# Patient Record
Sex: Male | Born: 1965
Health system: Southern US, Community
[De-identification: ages and names within clinical notes are randomized; demographics above are authoritative.]

## PROBLEM LIST (undated history)

## (undated) DIAGNOSIS — G4730 Sleep apnea, unspecified: Secondary | ICD-10-CM

## (undated) DIAGNOSIS — K219 Gastro-esophageal reflux disease without esophagitis: Secondary | ICD-10-CM

## (undated) DIAGNOSIS — E119 Type 2 diabetes mellitus without complications: Secondary | ICD-10-CM

## (undated) DIAGNOSIS — I10 Essential (primary) hypertension: Secondary | ICD-10-CM

## (undated) HISTORY — PX: TOE SURGERY: SHX1073

## (undated) HISTORY — DX: Gastro-esophageal reflux disease without esophagitis: K21.9

## (undated) HISTORY — PX: TOTAL HIP ARTHROPLASTY: SHX124

## (undated) HISTORY — DX: Essential (primary) hypertension: I10

---

## 2001-02-06 ENCOUNTER — Ambulatory Visit (HOSPITAL_BASED_OUTPATIENT_CLINIC_OR_DEPARTMENT_OTHER): Admission: RE | Admit: 2001-02-06 | Discharge: 2001-02-06 | Payer: Self-pay | Admitting: Internal Medicine

## 2002-02-21 ENCOUNTER — Emergency Department (HOSPITAL_COMMUNITY): Admission: EM | Admit: 2002-02-21 | Discharge: 2002-02-21 | Payer: Self-pay | Admitting: Emergency Medicine

## 2007-03-08 HISTORY — PX: JOINT REPLACEMENT: SHX530

## 2008-07-15 ENCOUNTER — Inpatient Hospital Stay (HOSPITAL_COMMUNITY): Admission: RE | Admit: 2008-07-15 | Discharge: 2008-07-18 | Payer: Self-pay | Admitting: Orthopaedic Surgery

## 2008-07-22 ENCOUNTER — Emergency Department (HOSPITAL_COMMUNITY): Admission: EM | Admit: 2008-07-22 | Discharge: 2008-07-23 | Payer: Self-pay | Admitting: Emergency Medicine

## 2010-06-15 LAB — BASIC METABOLIC PANEL
BUN: 7 mg/dL (ref 6–23)
BUN: 8 mg/dL (ref 6–23)
CO2: 28 mEq/L (ref 19–32)
CO2: 31 mEq/L (ref 19–32)
CO2: 31 mEq/L (ref 19–32)
Chloride: 97 mEq/L (ref 96–112)
Chloride: 98 mEq/L (ref 96–112)
Chloride: 98 mEq/L (ref 96–112)
Creatinine, Ser: 1.04 mg/dL (ref 0.4–1.5)
Creatinine, Ser: 1.09 mg/dL (ref 0.4–1.5)
GFR calc Af Amer: >60 mL/min (ref >60)
GFR calc Af Amer: >60 mL/min (ref >60)
Glucose, Bld: 114 mg/dL — ABNORMAL HIGH (ref 70–99)
Glucose, Bld: 117 mg/dL — ABNORMAL HIGH (ref 70–99)
Potassium: 3.5 mEq/L (ref 3.5–5.1)
Potassium: 4 mEq/L (ref 3.5–5.1)
Sodium: 137 mEq/L (ref 135–145)

## 2010-06-15 LAB — CBC
HCT: 33 % — ABNORMAL LOW (ref 39.0–52.0)
HCT: 32.6 % — ABNORMAL LOW (ref 39.0–52.0)
HCT: 34.1 % — ABNORMAL LOW (ref 39.0–52.0)
HCT: 38.4 % — ABNORMAL LOW (ref 39.0–52.0)
Hemoglobin: 10.8 g/dL — ABNORMAL LOW (ref 13.0–17.0)
Hemoglobin: 12.7 g/dL — ABNORMAL LOW (ref 13.0–17.0)
MCHC: 33 g/dL (ref 30.0–36.0)
MCHC: 33.1 g/dL (ref 30.0–36.0)
MCHC: 33.1 g/dL (ref 30.0–36.0)
MCHC: 33.3 g/dL (ref 30.0–36.0)
MCV: 77.4 fL — ABNORMAL LOW (ref 78.0–100.0)
MCV: 76.8 fL — ABNORMAL LOW (ref 78.0–100.0)
MCV: 77.6 fL — ABNORMAL LOW (ref 78.0–100.0)
MCV: 77.7 fL — ABNORMAL LOW (ref 78.0–100.0)
Platelets: 527 10*3/uL — ABNORMAL HIGH (ref 150–400)
Platelets: 297 10*3/uL (ref 150–400)
RBC: 4.26 MIL/uL (ref 4.22–5.81)
RBC: 3.9 MIL/uL — ABNORMAL LOW (ref 4.22–5.81)
RBC: 4.38 MIL/uL (ref 4.22–5.81)
RBC: 4.99 MIL/uL (ref 4.22–5.81)
RDW: 15.4 % (ref 11.5–15.5)
RDW: 15.5 % (ref 11.5–15.5)
RDW: 15.8 % — ABNORMAL HIGH (ref 11.5–15.5)
WBC: 10.5 10*3/uL (ref 4.0–10.5)
WBC: 9.1 10*3/uL (ref 4.0–10.5)

## 2010-06-15 LAB — DIFFERENTIAL
Eosinophils Absolute: 0.3 10*3/uL (ref 0.0–0.7)
Eosinophils Relative: 3 % (ref 0–5)
Lymphocytes Relative: 26 % (ref 12–46)
Lymphs Abs: 2.7 10*3/uL (ref 0.7–4.0)
Monocytes Relative: 6 % (ref 3–12)

## 2010-06-15 LAB — PROTIME-INR
INR: 2.4 — ABNORMAL HIGH (ref 0.00–1.49)
INR: 1.1 (ref 0.00–1.49)
Prothrombin Time: 27.6 seconds — ABNORMAL HIGH (ref 11.6–15.2)
Prothrombin Time: 21.1 seconds — ABNORMAL HIGH (ref 11.6–15.2)

## 2010-07-20 NOTE — Discharge Summary (Signed)
NAMEMANJINDER, Rivas NO.:  [id number]   MEDICAL RECORD NO.:  [id number]          PATIENT TYPE:  INP   LOCATION:  5001                         FACILITY:  [facility]   PHYSICIAN:  Lubertha Basque. Dalldorf, M.D.DATE OF BIRTH:  12/04/1965   DATE OF ADMISSION:  07/15/2008  DATE OF DISCHARGE:  07/18/2008                               DISCHARGE SUMMARY   ADMITTING DIAGNOSES:  1. Right hip end-stage degenerative joint disease.  2. Seasonal allergies.   DISCHARGE DIAGNOSES:  1. Right hip end-stage degenerative joint disease.  2. Seasonal allergies.   OPERATIONS:  Right total hip replacement.   BRIEF HISTORY:  Chad Rivas is a 45 year old black male, who is a patient  well known to our practice.  He has had increasing right hip pain and on  the x-ray has end-stage DJD, bone-on-bone.  He is having pain when he  walks, pain when he sleeps at night, and we have discussed total hip  replacement surgery.   PERTINENT LABORATORY DATA AND X-RAY FINDINGS:  WBCs started of at 7.1  and last testing was 11.0, hemoglobin 12.7, hematocrit 38.4, and  platelets 326.  Last INR was 1.7.  Sodium 137, potassium 3.5, BUN 8,  creatinine 1.08, and glucose 117.   COURSE IN THE HOSPITAL:  He was admitted postoperatively, was initially  placed on PCA Dilaudid pump, knee-high TEDs, incentive spirometer,  physical therapy to be touchdown weightbearing as tolerated.  Ferrous  sulfate, Colace, Senokot, Coumadin, and Lovenox DVT prophylaxis protocol  and then other additional medicines included antiemetics, sleeping  agents as necessary, and then oral pain medications.  His blood counts  were followed x3.  Dressing was changed the second day postop.  Wound  was noted to be benign.  No sign of infection or irritation.  Vital  signs remained stable throughout his hospital stay and slight elevation  in his white count, but his lungs were clear.  No urinary symptoms and  was eating and voiding well, and was  discharged home after clearing  physical therapy.   CONDITION ON DISCHARGE:  Improved.   FOLLOWUP:  He is released on Vicodin 1 every 4-6 hours as needed,  Coumadin dose regulated by pharmacy, and Robaxin for spasm.  May change  his dressing daily.  Low-sodium heart-healthy diet.  Touchdown  weightbearing on the right side.  Any sign of infection to call our  office [phone] and also to make that appointment for 2 weeks.  [facility] will provide home therapy and INRs.      Chad Rivas, P.A.      Lubertha Basque Chad Rivas, M.D.  Electronically Signed    MC/MEDQ  D:  07/18/2008  T:  07/18/2008  Job:  [id number]

## 2010-07-20 NOTE — Op Note (Signed)
NAMESEGUNDO, MAKELA NO.:  [id number]   MEDICAL RECORD NO.:  [id number]          PATIENT TYPE:  INP   LOCATION:  5001                         FACILITY:  [facility]   PHYSICIAN:  Lubertha Basque. Dalldorf, M.D.DATE OF BIRTH:  1965/09/30   DATE OF PROCEDURE:  07/15/2008  DATE OF DISCHARGE:                               OPERATIVE REPORT   PREOPERATIVE DIAGNOSIS:  Right hip degenerative arthritis.   POSTOPERATIVE DIAGNOSIS:  Right hip degenerative arthritis.   PROCEDURE:  Right total hip replacement.   ANESTHESIA:  General.   ATTENDING SURGEON:  Lubertha Basque. Jerl Santos, MD   ASSISTANT:  [name], PA   INDICATIONS FOR PROCEDURE:  The patient is a 45 year old man with a long  history of painful bilateral hips.  He has advanced degenerative change  on both sides seen by x-ray.  He has very limited motion, and cannot  rest, or walk without pain.  He had failed oral anti-inflammatories.  At  this point, he is offered hip replacement surgery on his most  symptomatic side which is the right.  Informed operative consent was  obtained after discussion of possible complications including reaction  to anesthesia, infection, DVT, PE, and death as well as dislocation.   SUMMARY, FINDINGS, AND PROCEDURE:  Under general anesthesia through a  standard posterior approach, a right total hip replacement was  performed.  He had advanced degenerative change, but excellent bone  quality.  We addressed his problem with an uncemented DePuy system using  a metal-on-metal construct.  We used a size 5 high-offset Summit stem in  the femur capped with a 40 +8.5 hip ball.  A hole eliminator was placed  in the acetabulum, which measured to a size 56 and was a pinnacle 100  cup.  This was filled with a 40 x 56 metal liner.  Bryna Colander  assisted throughout and was invaluable to the completion of the case in  that he help position and retract while I performed the procedure.  He  also closed  simultaneously to help minimize OR time.   DESCRIPTION OF PROCEDURE:  The patient was taken to the operating suite  where general anesthetic was applied without difficulty.  He was  positioned into the lateral decubitus position with the right hip up.  All bony prominences were appropriately padded, axillary roll was  placed, and hip positioners were utilized.  He was prepped and draped in  normal sterile fashion.  After the administration of IV vancomycin, a  posterior approach was taken to the right hip.  All appropriate anti-  infected measures were used including the preoperative IV antibiotic,  Betadine impregnated drape, and closed-hooded exhaust systems for each  member of the surgical team.  An incision was made with dissection down  through an abundance of adipose tissue to the gluteus maximus and IT  band.  This was incised longitudinally.  We dissected through probably 4  inches of this muscle to expose the hip.  This was a very difficult  exposure and did add a bit to the overall time of the case.  A femoral  neck cut was made just above the lesser trochanter.  The acetabulum was  exposed with appropriate retractors and labral tissues were excised.  We  then reamed towards the inner wall of the pelvis and sequentially up to  size 55 followed by placement of a size 56 pinnacle cup in appropriate  anteversion and tilt.  A trial liner was placed.  Attention was turned  toward the femur.  This was reamed and broached appropriately.  I did  lateralize with a special reamer.  My neck cut was relatively short and  consequently we felt that the +8.5 hip ball gave [place] the best stability  in extension with external rotation and flexion with internal rotation.  The leg lengths were felt to be equal with this +8.5 hip ball.  The size  5 high-offset component also seemed to fit best.  The trial components  removed followed by placement of size 5 high-offset Summit stem in  appropriate  anteversion.  We removed the trial liner from the cuff and  placed a hole eliminator and 40 x 56 metal liner.  We then placed a 40 x  8.5 hip ball on the femoral stem, which was dried appropriately.  The  hip was reduced and again was stable with extension and external  rotation and flexion and internal rotation.  Leg lengths were felt to be  about equal.  The wound was irrigated followed by reapproximation of the  short external rotators to the greater trochanteric region with  nonabsorbable suture.  IT band and gluteus maximus fascia were  reapproximated with #1 Ethibond.  Subcutaneous tissues were  reapproximated in three layers with 0 and 2-0 undyed Vicryl.  Skin was  closed with staples.  Adaptic was applied followed by dry gauze and  tape.  Estimated blood loss and intraoperative fluids obtained from  anesthesia records.   DISPOSITION:  The patient was extubated in the operating room and taken  to recovery room in stable condition.  He was to be admitted to the  Orthopedic Surgery Service for appropriate postop care to include  perioperative antibiotics and Coumadin plus Lovenox for DVT prophylaxis.      Lubertha Basque Jerl Santos, M.D.  Electronically Signed     PGD/MEDQ  D:  07/15/2008  T:  07/16/2008  Job:  [id number]

## 2012-10-24 ENCOUNTER — Ambulatory Visit: Payer: Self-pay

## 2012-10-24 ENCOUNTER — Other Ambulatory Visit: Payer: Self-pay | Admitting: Occupational Medicine

## 2012-10-24 DIAGNOSIS — R52 Pain, unspecified: Secondary | ICD-10-CM

## 2014-08-18 DIAGNOSIS — Y9222 Religious institution as the place of occurrence of the external cause: Secondary | ICD-10-CM | POA: Insufficient documentation

## 2014-08-18 DIAGNOSIS — T783XXA Angioneurotic edema, initial encounter: Secondary | ICD-10-CM | POA: Diagnosis not present

## 2014-08-18 DIAGNOSIS — X58XXXA Exposure to other specified factors, initial encounter: Secondary | ICD-10-CM | POA: Diagnosis not present

## 2014-08-18 DIAGNOSIS — Y998 Other external cause status: Secondary | ICD-10-CM | POA: Insufficient documentation

## 2014-08-18 DIAGNOSIS — Z880 Allergy status to penicillin: Secondary | ICD-10-CM | POA: Insufficient documentation

## 2014-08-18 DIAGNOSIS — Y9389 Activity, other specified: Secondary | ICD-10-CM | POA: Insufficient documentation

## 2014-08-18 DIAGNOSIS — R220 Localized swelling, mass and lump, head: Secondary | ICD-10-CM | POA: Diagnosis present

## 2014-08-19 ENCOUNTER — Encounter (HOSPITAL_COMMUNITY): Payer: Self-pay | Admitting: *Deleted

## 2014-08-19 ENCOUNTER — Emergency Department (HOSPITAL_COMMUNITY)
Admission: EM | Admit: 2014-08-19 | Discharge: 2014-08-19 | Disposition: A | Discharge status: Home / Self Care | Payer: 59 | Attending: Emergency Medicine | Admitting: Emergency Medicine

## 2014-08-19 DIAGNOSIS — T783XXA Angioneurotic edema, initial encounter: Secondary | ICD-10-CM

## 2014-08-19 LAB — BASIC METABOLIC PANEL
Anion gap: 6 (ref 5–15)
BUN: 12 mg/dL (ref 6–20)
CHLORIDE: 104 mmol/L (ref 101–111)
CO2: 27 mmol/L (ref 22–32)
Calcium: 8.5 mg/dL — ABNORMAL LOW (ref 8.9–10.3)
Creatinine, Ser: 1.03 mg/dL (ref 0.61–1.24)
Glucose, Bld: 126 mg/dL — ABNORMAL HIGH (ref 65–99)
POTASSIUM: 3.8 mmol/L (ref 3.5–5.1)
SODIUM: 137 mmol/L (ref 135–145)

## 2014-08-19 LAB — CBC WITH DIFFERENTIAL/PLATELET
BASOS ABS: 0 10*3/uL (ref 0.0–0.1)
Basophils Relative: 0 % (ref 0–1)
EOS ABS: 0.1 10*3/uL (ref 0.0–0.7)
EOS PCT: 1 % (ref 0–5)
HEMATOCRIT: 40 % (ref 39.0–52.0)
Hemoglobin: 12 g/dL — ABNORMAL LOW (ref 13.0–17.0)
LYMPHS PCT: 29 % (ref 12–46)
Lymphs Abs: 2.7 10*3/uL (ref 0.7–4.0)
MCH: 23.7 pg — AB (ref 26.0–34.0)
MCHC: 30 g/dL (ref 30.0–36.0)
MCV: 78.9 fL (ref 78.0–100.0)
MONO ABS: 0.6 10*3/uL (ref 0.1–1.0)
Monocytes Relative: 6 % (ref 3–12)
Neutro Abs: 5.7 10*3/uL (ref 1.7–7.7)
Neutrophils Relative %: 64 % (ref 43–77)
Platelets: 347 10*3/uL (ref 150–400)
RBC: 5.07 MIL/uL (ref 4.22–5.81)
RDW: 15.2 % (ref 11.5–15.5)
WBC: 9 10*3/uL (ref 4.0–10.5)

## 2014-08-19 LAB — PROTIME-INR
INR: 1.09 (ref 0.00–1.49)
Prothrombin Time: 14.3 seconds (ref 11.6–15.2)

## 2014-08-19 MED ORDER — FAMOTIDINE IN NACL 20-0.9 MG/50ML-% IV SOLN
20.0000 mg | Freq: Once | INTRAVENOUS | Status: AC
  Administered 2014-08-19: 20 mg via INTRAVENOUS
  Filled 2014-08-19: qty 50

## 2014-08-19 MED ORDER — METHYLPREDNISOLONE SODIUM SUCC 125 MG IJ SOLR
125.0000 mg | Freq: Once | INTRAMUSCULAR | Status: AC
  Administered 2014-08-19: 125 mg via INTRAVENOUS
  Filled 2014-08-19: qty 2

## 2014-08-19 MED ORDER — PREDNISONE 20 MG PO TABS: 60.0000 mg | ORAL_TABLET | Freq: Every day | ORAL | Status: DC

## 2014-08-19 MED ORDER — DIPHENHYDRAMINE HCL 25 MG PO TABS: 25.0000 mg | ORAL_TABLET | Freq: Four times a day (QID) | ORAL | Status: DC

## 2014-08-19 MED ORDER — FAMOTIDINE 20 MG PO TABS: 20.0000 mg | ORAL_TABLET | Freq: Two times a day (BID) | ORAL | Status: DC

## 2014-08-19 MED ORDER — DIPHENHYDRAMINE HCL 50 MG/ML IJ SOLN
50.0000 mg | Freq: Once | INTRAMUSCULAR | Status: AC
  Administered 2014-08-19: 50 mg via INTRAVENOUS
  Filled 2014-08-19: qty 1

## 2014-08-19 NOTE — ED Notes (Signed)
Pt reports R facial swelling since 1800 last night.  Pt reports applying ice compress without any change.  Denies taking any antiHTN meds .

## 2014-08-19 NOTE — ED Provider Notes (Signed)
CSN: [id number]     Arrival date & time 08/18/14  2328 History   First MD Initiated Contact with Patient 08/19/14 0029     Chief Complaint  Patient presents with  . Angioedema     (Consider location/radiation/quality/duration/timing/severity/associated sxs/prior Treatment) HPI 49 year old male presents to emergency department with complaint of facial swelling.  Symptoms started around 6:00 last night while at [employer] beating.  He reports that he had tightness to the side of his face.  He reports that it is spread to involve his upper and lower lip as well as just under his chin.  He denies any difficulty breathing, no difficulty swallowing.  No involvement of the tongue.  Patient is not on any medications.  He reports he has had similar but milder episodes in the past.  He associated one episode with taking generic Naprosyn.  Patient did take ibuprofen earlier today. History reviewed. No pertinent past medical history. Past Surgical History  Procedure Laterality Date  . Joint replacement  2009    R hip   No family history on file. History  Substance Use Topics  . Smoking status: Never Smoker   . Smokeless tobacco: Not on file  . Alcohol Use: No    Review of Systems   See History of Present Illness; otherwise all other systems are reviewed and negative  Allergies  Penicillins  Home Medications   Prior to Admission medications   Medication Sig Start Date End Date Taking? Authorizing Provider  ibuprofen (ADVIL,MOTRIN) 200 MG tablet Take 800 mg by mouth every 6 (six) hours as needed (for pain.).   Yes Historical Provider, MD  diphenhydrAMINE (BENADRYL) 25 MG tablet Take 1 tablet (25 mg total) by mouth every 6 (six) hours. 08/19/14   Marisa Severin, MD  famotidine (PEPCID) 20 MG tablet Take 1 tablet (20 mg total) by mouth 2 (two) times daily. 08/19/14   Marisa Severin, MD  predniSONE (DELTASONE) 20 MG tablet Take 3 tablets (60 mg total) by mouth daily. 08/19/14   Marisa Severin, MD   BP 136/86  mmHg  Pulse 75  Temp(Src) 98.1 F (36.7 C) (Oral)  Resp 22  SpO2 98% Physical Exam  Constitutional: He is oriented to person, place, and time. He appears well-developed and well-nourished.  HENT:  Head: Atraumatic.  Nose: Nose normal.  Mouth/Throat: Oropharynx is clear and moist.  Patient has angioedema of right upper and lower lip.  There does not appear to be any involvement of the tongue or oropharynx.  He has edema extending into his right cheek and under the jaw.  There is no woody induration or elevation of the tongue.  There is no stridor  Eyes: Conjunctivae and EOM are normal. Pupils are equal, round, and reactive to light.  Neck: Normal range of motion. Neck supple. No JVD present. No tracheal deviation present. No thyromegaly present.  Cardiovascular: Normal rate, regular rhythm, normal heart sounds and intact distal pulses.  Exam reveals no gallop and no friction rub.   No murmur heard. Pulmonary/Chest: Effort normal and breath sounds normal. No stridor. No respiratory distress. He has no wheezes. He has no rales. He exhibits no tenderness.  Abdominal: Soft. Bowel sounds are normal. He exhibits no distension and no mass. There is no tenderness. There is no rebound and no guarding.  Musculoskeletal: Normal range of motion. He exhibits no edema or tenderness.  Lymphadenopathy:    He has no cervical adenopathy.  Neurological: He is alert and oriented to person, place, and time.  He displays normal reflexes. He exhibits normal muscle tone. Coordination normal.  Skin: Skin is warm and dry. No rash noted. No erythema. No pallor.  Psychiatric: He has a normal mood and affect. His behavior is normal. Judgment and thought content normal.  Nursing note and vitals reviewed.   ED Course  Procedures (including critical care time) Labs Review Labs Reviewed  BASIC METABOLIC PANEL - Abnormal; Notable for the following:    Glucose, Bld 126 (*)    Calcium 8.5 (*)    All other components  within normal limits  CBC WITH DIFFERENTIAL/PLATELET - Abnormal; Notable for the following:    Hemoglobin 12.0 (*)    MCH 23.7 (*)    All other components within normal limits  PROTIME-INR    Imaging Review No results found.   EKG Interpretation None      MDM   Final diagnoses:  Angioedema, initial encounter    49 year old male with angioedema.  Does not appear to be Ace inhibitor related.  Patient has been monitored for 4 hours without progression.  There is some mild resolution of the angioedema after treatment.  Patient advised that he will need follow-up with an allergist for further evaluation.  As he has had problems with Naprosyn in the past, he is instructed to avoid all NSAIDs.    Marisa Severin, MD 08/19/14 [phone]

## 2014-08-19 NOTE — Discharge Instructions (Signed)
It is unclear at this time what caused swelling of her face and lips.  As you have had reactions in the past to generic Naprosyn.  It is recommended that you avoid all non-steroidal anti-inflammatory medications to include: Motrin, ibuprofen, Aleve, Naprosyn, and aspirin.  Establish care with a local primary care doctor.  It is recommended you see an allergist.  Return to the emergency department for any worsening of your swelling, difficulties breathing or swallowing.  Take medications as prescribed.   Angioedema Angioedema is a sudden swelling of tissues, often of the skin. It can occur on the face or genitals or in the abdomen or other body parts. The swelling usually develops over a short period and gets better in 24 to 48 hours. It often begins during the night and is found when the person wakes up. The person may also get red, itchy patches of skin (hives). Angioedema can be dangerous if it involves swelling of the air passages.  Depending on the cause, episodes of angioedema may only happen once, come back in unpredictable patterns, or repeat for several years and then gradually fade away.  CAUSES  Angioedema can be caused by an allergic reaction to various triggers. It can also result from nonallergic causes, including reactions to drugs, immune system disorders, viral infections, or an abnormal gene that is passed to you from your parents (hereditary). For some people with angioedema, the cause is unknown.  Some things that can trigger angioedema include:   Foods.   Medicines, such as ACE inhibitors, ARBs, nonsteroidal anti-inflammatory agents, or estrogen.   Latex.   Animal saliva.   Insect stings.   Dyes used in X-rays.   Mild injury.   Dental work.  Surgery.  Stress.   Sudden changes in temperature.   Exercise. SIGNS AND SYMPTOMS   Swelling of the skin.  Hives. If these are present, there is also intense itching.  Redness in the affected area.   Pain in  the affected area.  Swollen lips or tongue.  Breathing problems. This may happen if the air passages swell.  Wheezing. If internal organs are involved, there may be:   Nausea.   Abdominal pain.   Vomiting.   Difficulty swallowing.   Difficulty passing urine. DIAGNOSIS   Your health care provider will examine the affected area and take a medical and family history.  Various tests may be done to help determine the cause. Tests may include:  Allergy skin tests to see if the problem is an allergic reaction.   Blood tests to check for hereditary angioedema.   Tests to check for underlying diseases that could cause the condition.   A review of your medicines, including over-the-counter medicines, may be done. TREATMENT  Treatment will depend on the cause of the angioedema. Possible treatments include:   Removal of anything that triggered the condition (such as stopping certain medicines).   Medicines to treat symptoms or prevent attacks. Medicines given may include:   Antihistamines.   Epinephrine injection.   Steroids.   Hospitalization may be required for severe attacks. If the air passages are affected, it can be an emergency. Tubes may need to be placed to keep the airway open. HOME CARE INSTRUCTIONS   Take all medicines as directed by your health care provider.  If you were given medicines for emergency allergy treatment, always carry them with you.  Wear a medical bracelet as directed by your health care provider.   Avoid known triggers. SEEK MEDICAL CARE IF:  You have repeat attacks of angioedema.   Your attacks are more frequent or more severe despite preventive measures.   You have hereditary angioedema and are considering having children. It is important to discuss with your health care provider the risks of passing the condition on to your children. SEEK IMMEDIATE MEDICAL CARE IF:   You have severe swelling of the mouth, tongue, or  lips.  You have difficulty breathing.   You have difficulty swallowing.   You faint. MAKE SURE YOU:  Understand these instructions.  Will watch your condition.  Will get help right away if you are not doing well or get worse. Document Released: 05/02/2001 Document Revised: 07/08/2013 Document Reviewed: 10/15/2012 Unknown [facility] Patient Information 2015 [place], [place]. This information is not intended to replace advice given to you by your health care provider. Make sure you discuss any questions you have with your health care provider.

## 2014-10-03 ENCOUNTER — Emergency Department (HOSPITAL_COMMUNITY)
Admission: EM | Admit: 2014-10-03 | Discharge: 2014-10-03 | Disposition: A | Discharge status: Home / Self Care | Payer: Commercial Managed Care - HMO | Attending: Emergency Medicine | Admitting: Emergency Medicine

## 2014-10-03 ENCOUNTER — Encounter (HOSPITAL_COMMUNITY): Payer: Self-pay | Admitting: Emergency Medicine

## 2014-10-03 DIAGNOSIS — L299 Pruritus, unspecified: Secondary | ICD-10-CM | POA: Diagnosis present

## 2014-10-03 DIAGNOSIS — T783XXA Angioneurotic edema, initial encounter: Secondary | ICD-10-CM | POA: Diagnosis not present

## 2014-10-03 DIAGNOSIS — Y9389 Activity, other specified: Secondary | ICD-10-CM | POA: Insufficient documentation

## 2014-10-03 DIAGNOSIS — Z7952 Long term (current) use of systemic steroids: Secondary | ICD-10-CM | POA: Diagnosis not present

## 2014-10-03 DIAGNOSIS — Z880 Allergy status to penicillin: Secondary | ICD-10-CM | POA: Insufficient documentation

## 2014-10-03 DIAGNOSIS — X58XXXA Exposure to other specified factors, initial encounter: Secondary | ICD-10-CM | POA: Insufficient documentation

## 2014-10-03 DIAGNOSIS — Z79899 Other long term (current) drug therapy: Secondary | ICD-10-CM | POA: Diagnosis not present

## 2014-10-03 DIAGNOSIS — Y998 Other external cause status: Secondary | ICD-10-CM | POA: Diagnosis not present

## 2014-10-03 DIAGNOSIS — Y9289 Other specified places as the place of occurrence of the external cause: Secondary | ICD-10-CM | POA: Insufficient documentation

## 2014-10-03 MED ORDER — FAMOTIDINE IN NACL 20-0.9 MG/50ML-% IV SOLN
20.0000 mg | Freq: Once | INTRAVENOUS | Status: AC
  Administered 2014-10-03: 20 mg via INTRAVENOUS
  Filled 2014-10-03: qty 50

## 2014-10-03 MED ORDER — DIPHENHYDRAMINE HCL 50 MG/ML IJ SOLN
25.0000 mg | Freq: Once | INTRAMUSCULAR | Status: AC
  Administered 2014-10-03: 25 mg via INTRAVENOUS
  Filled 2014-10-03: qty 1

## 2014-10-03 MED ORDER — DIPHENHYDRAMINE HCL 25 MG PO TABS: 25.0000 mg | ORAL_TABLET | Freq: Four times a day (QID) | ORAL | Status: DC | PRN

## 2014-10-03 MED ORDER — EPINEPHRINE 0.3 MG/0.3ML IJ SOAJ
0.3000 mg | Freq: Once | INTRAMUSCULAR | Status: AC
  Administered 2014-10-03: 0.3 mg via SUBCUTANEOUS
  Filled 2014-10-03: qty 0.3

## 2014-10-03 MED ORDER — METHYLPREDNISOLONE SODIUM SUCC 125 MG IJ SOLR
125.0000 mg | Freq: Once | INTRAMUSCULAR | Status: AC
  Administered 2014-10-03: 125 mg via INTRAVENOUS
  Filled 2014-10-03: qty 2

## 2014-10-03 MED ORDER — PREDNISONE 20 MG PO TABS: 40.0000 mg | ORAL_TABLET | Freq: Every day | ORAL | Status: DC

## 2014-10-03 NOTE — ED Notes (Signed)
Pt normal when he went to bed last night; awoke with swelling to L orbit this morning and L upper arm.  +itching, redness; unsure of insect bites, denies new foods, medications.

## 2014-10-03 NOTE — ED Notes (Signed)
Pt. Stated, I started breaking out with rash and itching about 2 hours ago.

## 2014-10-03 NOTE — Discharge Instructions (Signed)
Angioedema °Angioedema is a sudden swelling of tissues, often of the skin. It can occur on the face or genitals or in the abdomen or other body parts. The swelling usually develops over a short period and gets better in 24 to 48 hours. It often begins during the night and is found when the person wakes up. The person may also get red, itchy patches of skin (hives). Angioedema can be dangerous if it involves swelling of the air passages.  °Depending on the cause, episodes of angioedema may only happen once, come back in unpredictable patterns, or repeat for several years and then gradually fade away.  °CAUSES  °Angioedema can be caused by an allergic reaction to various triggers. It can also result from nonallergic causes, including reactions to drugs, immune system disorders, viral infections, or an abnormal gene that is passed to you from your parents (hereditary). For some people with angioedema, the cause is unknown.  °Some things that can trigger angioedema include:  °· Foods.   °· Medicines, such as ACE inhibitors, ARBs, nonsteroidal anti-inflammatory agents, or estrogen.   °· Latex.   °· Animal saliva.   °· Insect stings.   °· Dyes used in X-rays.   °· Mild injury.   °· Dental work. °· Surgery. °· Stress.   °· Sudden changes in temperature.   °· Exercise. °SIGNS AND SYMPTOMS  °· Swelling of the skin. °· Hives. If these are present, there is also intense itching. °· Redness in the affected area.   °· Pain in the affected area. °· Swollen lips or tongue. °· Breathing problems. This may happen if the air passages swell. °· Wheezing. °If internal organs are involved, there may be:  °· Nausea.   °· Abdominal pain.   °· Vomiting.   °· Difficulty swallowing.   °· Difficulty passing urine. °DIAGNOSIS  °· Your health care provider will examine the affected area and take a medical and family history. °· Various tests may be done to help determine the cause. Tests may include: °¨ Allergy skin tests to see if the problem  is an allergic reaction.   °¨ Blood tests to check for hereditary angioedema.   °¨ Tests to check for underlying diseases that could cause the condition.   °· A review of your medicines, including over-the-counter medicines, may be done. °TREATMENT  °Treatment will depend on the cause of the angioedema. Possible treatments include:  °· Removal of anything that triggered the condition (such as stopping certain medicines).   °· Medicines to treat symptoms or prevent attacks. Medicines given may include:   °¨ Antihistamines.   °¨ Epinephrine injection.   °¨ Steroids.   °· Hospitalization may be required for severe attacks. If the air passages are affected, it can be an emergency. Tubes may need to be placed to keep the airway open. °HOME CARE INSTRUCTIONS  °· Take all medicines as directed by your health care provider. °· If you were given medicines for emergency allergy treatment, always carry them with you. °· Wear a medical bracelet as directed by your health care provider.   °· Avoid known triggers. °SEEK MEDICAL CARE IF:  °· You have repeat attacks of angioedema.   °· Your attacks are more frequent or more severe despite preventive measures.   °· You have hereditary angioedema and are considering having children. It is important to discuss with your health care provider the risks of passing the condition on to your children. °SEEK IMMEDIATE MEDICAL CARE IF:  °· You have severe swelling of the mouth, tongue, or lips. °· You have difficulty breathing.   °· You have difficulty swallowing.   °· You faint. °MAKE   SURE YOU: °· Understand these instructions. °· Will watch your condition. °· Will get help right away if you are not doing well or get worse. °Document Released: 05/02/2001 Document Revised: 07/08/2013 Document Reviewed: 10/15/2012 °[employer]® Patient Information ©2015 [employer]. This information is not intended to replace advice given to you by your health care provider. Make sure you discuss any questions  you have with your health care provider. ° °

## 2014-10-03 NOTE — ED Provider Notes (Signed)
CSN: [id number]     Arrival date & time 10/03/14  1238 History  This chart was scribed for non-physician practitioner, Roxy Horseman, PA-C, working with Rolland Porter, MD by Charline Bills, ED Scribe. This patient was seen in room TR05C/TR05C and the patient's care was started at 1:48 PM.   Chief Complaint  Patient presents with  . Allergic Reaction  . Rash  . Pruritis   The history is provided by the patient. No language interpreter was used.   HPI Comments: Chad Rivas is a 49 y.o. male who presents to the Emergency Department with a chief complaint of gradually worsening swelling to left orbit for the past 3 hours. 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 He denies associated eye pain. Pt further reports gradually worsening  pruritic, red rash to his left upper arm over the past 3 hours. He is unsure of any insect bites or exposure to new products.   History reviewed. No pertinent past medical history. Past Surgical History  Procedure Laterality Date  . Joint replacement  2009    R hip   No family history on file. History  Substance Use Topics  . Smoking status: Never Smoker   . Smokeless tobacco: Not on file  . Alcohol Use: No    Review of Systems  HENT: Positive for facial swelling (left orbit).   Eyes: Negative for pain.  Skin: Positive for color change and rash.   Allergies  Penicillins  Home Medications   Prior to Admission medications   Medication Sig Start Date End Date Taking? Authorizing Provider  diphenhydrAMINE (BENADRYL) 25 MG tablet Take 1 tablet (25 mg total) by mouth every 6 (six) hours. 08/19/14   Marisa Severin, MD  famotidine (PEPCID) 20 MG tablet Take 1 tablet (20 mg total) by mouth 2 (two) times daily. 08/19/14   Marisa Severin, MD  ibuprofen (ADVIL,MOTRIN) 200 MG tablet Take 800 mg by mouth every 6 (six) hours as needed (for pain.).    Historical Provider, MD  predniSONE (DELTASONE) 20 MG tablet  Take 3 tablets (60 mg total) by mouth daily. 08/19/14   Marisa Severin, MD   BP 129/86 mmHg  Pulse 88  Temp(Src) 98.3 F (36.8 C) (Oral)  Resp 17  Ht  (1.778 m)  Wt 357 lb 3 oz (162.019 kg)  BMI 51.25 kg/m2  SpO2 95% Physical Exam  Constitutional: He is oriented to person, place, and time. He appears well-developed and well-nourished. No distress.  HENT:  Head: Normocephalic and atraumatic.  Eyes: Conjunctivae and EOM are normal.  Neck: Neck supple. No tracheal deviation present.  Cardiovascular: Normal rate.   Pulmonary/Chest: Effort normal. No respiratory distress.  Musculoskeletal: Normal range of motion.  Neurological: He is alert and oriented to person, place, and time.  Skin: Skin is warm and dry. Rash noted.  Rash/allergic reaction as pictured.   Psychiatric: He has a normal mood and affect. His behavior is normal.  Nursing note and vitals reviewed.       ED Course  Procedures (including critical care time) DIAGNOSTIC STUDIES: Oxygen Saturation is 95% on RA, normal by my interpretation.    COORDINATION OF CARE: 1:51 PM-Discussed treatment plan which includes Benadryl, Pepcid, Solu-Medrol and Epinephrine with pt at bedside and pt agreed to plan.   Labs Review Labs Reviewed - No data to display  Imaging Review No results found.   EKG Interpretation None  MDM   Final diagnoses:  Angio-edema, initial encounter    Patient with unknown allergic reaction.  Started this morning, has progressively worsened.  No wheezing, difficulty breathing, talking, or swallowing.  No stridor.  Patient seen by and discussed with Dr. Fayrene Fearing, who recommends subq epi, solumedrol, pepcid, and benadryl.  Will reassess.   3:04 PM Patient re-examined.  Patient states that he is feeling better.  His swelling still persists, but it has softened.  Patient also re-examined by Dr. Fayrene Fearing, who recommends observing for one more hour, if no progression of symptoms, recommend DC with  prednisone.  Patient has had similar symptoms before the past. Patient will require an allergist follow-up appointment.  4:06 PM Patient reassessed by me.  No advancement of the swelling or reaction.  No stridor, wheezing, or airway compromise.  Patient states that he feels a little better.  Looks slightly improved on my exam.  Will discharge with prednisone and benadryl per plan established by Dr. Fayrene Fearing.  Return precautions given.  Patient is stable and ready for discharge.   I personally performed the services described in this documentation, which was scribed in my presence. The recorded information has been reviewed and is accurate.     Roxy Horseman, PA-C 10/03/14 1618  Rolland Porter, MD 10/10/14 [phone]

## 2014-10-08 ENCOUNTER — Telehealth: Payer: Self-pay | Admitting: Internal Medicine

## 2014-10-08 NOTE — Telephone Encounter (Signed)
LMTCB x 1 

## 2014-10-09 NOTE — Telephone Encounter (Signed)
Pt never seen in office. Pt was seen in ED 10/03/14 and was told he would need to see an allergist in 1 week. They were given Dr. Roxy Cedar name. They are requesting a work in appt if possible. Please advise thanks

## 2014-10-09 NOTE — Telephone Encounter (Signed)
Sorry we are booked up; per CY we can suggest patient go to Dr Kathyrn Lass group for allergy. Thanks.

## 2014-10-09 NOTE — Telephone Encounter (Signed)
Called made spouse aware of recs. She verbalized understanding and needed nothing further needed

## 2014-11-07 ENCOUNTER — Encounter: Payer: Self-pay | Admitting: Gastroenterology

## 2014-12-01 ENCOUNTER — Ambulatory Visit: Payer: Self-pay | Admitting: Gastroenterology

## 2014-12-09 ENCOUNTER — Telehealth: Payer: Self-pay

## 2014-12-09 NOTE — Telephone Encounter (Signed)
Spoke with patient. He has lip swelling without any other issues. Progression has stopped.since taking benadryl. He will come in tomorrow at 830 and take benadryl every 4-6 hours until that time. He will go to the ER if worse.

## 2014-12-09 NOTE — Telephone Encounter (Signed)
PATIENT CALLED STATES," HE'S HAVING ANOTHER RXN WITH SWELLING ALL OVER; STARTED AN HOUR AGO". PT ADVISED TO CALL OFFICE WHEN IT HAPPENED AGAIN. PLEASE ADVISE NEXT STEP.

## 2014-12-10 ENCOUNTER — Encounter: Payer: Self-pay | Admitting: Allergy and Immunology

## 2014-12-10 ENCOUNTER — Ambulatory Visit (INDEPENDENT_AMBULATORY_CARE_PROVIDER_SITE_OTHER): Payer: Commercial Managed Care - HMO | Admitting: Allergy and Immunology

## 2014-12-10 VITALS — BP 122/80 | HR 80 | Resp 16

## 2014-12-10 DIAGNOSIS — B029 Zoster without complications: Secondary | ICD-10-CM

## 2014-12-10 DIAGNOSIS — L500 Allergic urticaria: Secondary | ICD-10-CM

## 2014-12-10 DIAGNOSIS — T7840XS Allergy, unspecified, sequela: Secondary | ICD-10-CM

## 2014-12-10 DIAGNOSIS — T783XXD Angioneurotic edema, subsequent encounter: Secondary | ICD-10-CM

## 2014-12-10 MED ORDER — FAMOTIDINE 20 MG PO TABS: 20.0000 mg | ORAL_TABLET | Freq: Two times a day (BID) | ORAL | Status: DC

## 2014-12-10 MED ORDER — LORATADINE 10 MG PO TABS: ORAL_TABLET | ORAL | Status: DC

## 2014-12-10 MED ORDER — EPINEPHRINE 0.3 MG/0.3ML IJ SOAJ: 0.3000 mg | Freq: Once | INTRAMUSCULAR | Status: DC

## 2014-12-10 NOTE — Progress Notes (Signed)
[facility]  Follow-up Note  Subjective  Chad Rivas is a 49 y.o. male who [facility] in re-evaluation of the following:  HPI Comments: .  Chad Rivas had developed lip swelling the past 1-2 days without any urticaria or other systemic symptoms. However, he did notice that two days ago he developed a blistering rash on his back that has since opened up and is hearing. He actually does not have much pain with this rash but maybe a little burning. He took some benadryl last night which helped his angioedema.    Current outpatient prescriptions:  .  diphenhydrAMINE (BENADRYL) 25 MG tablet, Take 1 tablet (25 mg total) by mouth every 6 (six) hours as needed for itching (Rash)., Disp: 30 tablet, Rfl: 0 .  famotidine (PEPCID) 20 MG tablet, Take 1 tablet (20 mg total) by mouth 2 (two) times daily., Disp: 30 tablet, Rfl: 0 .  ibuprofen (ADVIL,MOTRIN) 200 MG tablet, Take 800 mg by mouth every 6 (six) hours as needed (for pain.)., Disp: , Rfl:  .  metFORMIN (GLUCOPHAGE) 500 MG tablet, Take 500 mg by mouth 2 (two) times daily with a meal., Disp: , Rfl:  .  EPINEPHrine (EPIPEN 2-PAK) 0.3 mg/0.3 mL IJ SOAJ injection, Inject 0.3 mLs (0.3 mg total) into the muscle once., Disp: 1 Device, Rfl: 2 .  loratadine (CLARITIN) 10 MG tablet, TAKE TWO TABLETS ONCE DAILY., Disp: 60 tablet, Rfl: 5 .  predniSONE (DELTASONE) 20 MG tablet, Take 2 tablets (40 mg total) by mouth daily. (Patient not taking: Reported on 12/10/2014), Disp: 10 tablet, Rfl: 0  Meds ordered this encounter  Medications  . DISCONTD: famotidine (PEPCID) 20 MG tablet    Sig: Take 1 tablet (20 mg total) by mouth 2 (two) times daily.    Dispense:  30 tablet    Refill:  0  . loratadine (CLARITIN) 10 MG tablet    Sig: TAKE TWO TABLETS ONCE DAILY.    Dispense:  60 tablet    Refill:  5  . EPINEPHrine (EPIPEN 2-PAK) 0.3 mg/0.3 mL IJ SOAJ injection    Sig: Inject 0.3  mLs (0.3 mg total) into the muscle once.    Dispense:  1 Device    Refill:  2  . famotidine (PEPCID) 20 MG tablet    Sig: Take 1 tablet (20 mg total) by mouth 2 (two) times daily.    Dispense:  30 tablet    Refill:  0    No past medical history on file.  Past Surgical History  Procedure Laterality Date  . Joint replacement  2009    R hip    Allergies  Allergen Reactions  . Penicillins Anaphylaxis    Review of Systems  Constitutional: Negative for fever, chills and malaise/fatigue.  HENT: Negative for congestion, ear discharge, ear pain and sore throat.   Eyes: Negative for blurred vision and redness.  Respiratory: Negative for cough, sputum production, shortness of breath and wheezing.   Cardiovascular: Negative for chest pain.  Gastrointestinal: Negative for heartburn and nausea.  Musculoskeletal: Negative for myalgias and joint pain.  Skin: Positive for itching and rash.  Neurological: Negative for headaches.     Objective:   Filed Vitals:   12/10/14 0900  BP: 122/80  Pulse: 80  Resp: 16    Physical Exam  Constitutional: He is well-developed, well-nourished, and in no distress. No distress.  HENT:  Head: Normocephalic and atraumatic. Head  is without right periorbital erythema and without left periorbital erythema.  Right Ear: Tympanic membrane, external ear and ear canal normal. No drainage. No foreign bodies. Tympanic membrane is not injected, not scarred, not perforated, not erythematous, not retracted and not bulging. No middle ear effusion.  Left Ear: Tympanic membrane, external ear and ear canal normal. No drainage. No foreign bodies. Tympanic membrane is not injected, not scarred, not perforated, not erythematous, not retracted and not bulging.  No middle ear effusion.  Nose: Nose normal. No mucosal edema, rhinorrhea, nose lacerations, sinus tenderness, nasal deformity, septal deviation or nasal septal hematoma. No epistaxis.  Mouth/Throat: Oropharynx is  clear and moist and mucous membranes are normal. No oropharyngeal exudate, posterior oropharyngeal edema, posterior oropharyngeal erythema or tonsillar abscesses.  Eyes: Conjunctivae and lids are normal. Pupils are equal, round, and reactive to light. Right eye exhibits no discharge and no exudate. No foreign body present in the right eye. Left eye exhibits no discharge and no exudate. No foreign body present in the left eye. Right conjunctiva is not injected. Right conjunctiva has no hemorrhage. Left conjunctiva is not injected. Left conjunctiva has no hemorrhage. No scleral icterus.  Neck: No tracheal tenderness present. No tracheal deviation present. No thyromegaly present.  Cardiovascular: Normal rate, regular rhythm, S1 normal, S2 normal and normal heart sounds.  Exam reveals no gallop and no friction rub.   No murmur heard. Pulmonary/Chest: Effort normal. No stridor. No respiratory distress. He has no wheezes. He has no rhonchi. He has no rales. He exhibits no tenderness.  Musculoskeletal: He exhibits no edema or tenderness.  Lymphadenopathy:    He has no cervical adenopathy.  Skin: Rash noted. No purpura noted. Rash is vesicular. Rash is not macular, not maculopapular, not nodular, not pustular and not urticarial. He is not diaphoretic. No cyanosis or erythema. No pallor. Nails show no clubbing.  He had a large erythematous dermitis with evidence of sloughed vesicular skin approximately 20 cm wide by 30 cm long on the right side of his mid back. There was no fluid drainage or purulent material.    Diagnostics:      .    Assessment and Plan:   1. Allergic reaction, sequela   2. Angioedema, subsequent encounter   3. Allergic urticaria   4. Shingles outbreak    1. Every Day, use the following medications:   A. Loratadine  - two tablets one time per day  B. Famotidine  - one tablet one time per day  2. Continue benadryl if needed   3. Continue Epi-Pen if needed.   4.  Prednisone  now  5. Call me with any problems  6. Return in 12 weeks  Chad Rivas has had another episode of immunologic hyperreactivity that may have been triggered by his shingles outbreak. We will have him use preventative medications at this point. Concerning his shingles, he actually has very little pain and it appears his dermatitis is already healing so we ill hold off on valtrex at this point. He will contact me if this lesion begines to cause him problems.      Laurette Schimke, MD [facility]

## 2014-12-10 NOTE — Patient Instructions (Signed)
  1. Every Day, use the following medications:   A. Loratadine  - two tablets one time per day  B. Famotidine  - one tablet one time per day  2. Continue benadryl if needed   3. Continue Epi-Pen if needed.   4. Prednisone  now  5. Call me with any problems  6. Return in 12 weeks

## 2014-12-26 ENCOUNTER — Encounter: Payer: Self-pay | Admitting: Gastroenterology

## 2015-01-13 ENCOUNTER — Encounter: Payer: Commercial Managed Care - HMO | Attending: Nurse Practitioner

## 2015-01-13 VITALS — Ht 70.0 in | Wt 351.0 lb

## 2015-01-13 DIAGNOSIS — E119 Type 2 diabetes mellitus without complications: Secondary | ICD-10-CM | POA: Insufficient documentation

## 2015-01-13 DIAGNOSIS — Z713 Dietary counseling and surveillance: Secondary | ICD-10-CM | POA: Insufficient documentation

## 2015-01-13 NOTE — Progress Notes (Signed)
Diabetes Self-Management Education  Visit Type: First/Initial  Appt. Start Time: 8:00 Appt. End Time: 9:00  01/13/2015  [name], identified by name and date of birth, is a 49 y.o. male with a diagnosis of Diabetes: Type 2.   ASSESSMENT  Height 5\' 10"  (1.778 m), weight 351 lb (159.213 kg). Body mass index is 50.36 kg/(m^2).  Core Class 1      Diabetes Self-Management Education - 01/13/15 1046    Visit Information   Visit Type First/Initial   Initial Visit   Diabetes Type Type 2   Are you currently following a meal plan? No   Are you taking your medications as prescribed? Yes   Health Coping   How would you rate your overall health? Fair   Psychosocial Assessment   Patient Belief/Attitude about Diabetes Motivated to manage diabetes   Self-care barriers None   Patient Concerns Nutrition/Meal planning   Special Needs None   Preferred Learning Style No preference indicated   How often do you need to have someone help you when you read instructions, pamphlets, or other written materials from your doctor or pharmacy? 1 - Never   What is the last grade level you completed in school? 9/16   Complications   How often do you check your blood sugar? 1-2 times/day   Have you had a dilated eye exam in the past 12 months? No   Have you had a dental exam in the past 12 months? No   Are you checking your feet? No   Exercise   How many days per week to you exercise? 2   How many minutes per day do you exercise? 20   Total minutes per week of exercise 40   Patient Education   Previous Diabetes Education No   Disease state  Definition of diabetes, type 1 and 2, and the diagnosis of diabetes;Factors that contribute to the development of diabetes   Outcomes   Future DMSE --  1 week core 2   Program Status Not Completed      Individualized Plan for Diabetes Self-Management Training:   Learning Objective:  Patient will have a greater understanding of diabetes  self-management. Patient education plan is to attend individual and/or group sessions per assessed needs and concerns.   Plan:   There are no Patient Instructions on file for this visit.  Expected Outcomes:     Education material provided: Living Well with Diabetes, Food label handouts, Meal plan card, My Plate and Snack sheet  If problems or questions, patient to contact team via:  Phone  Future DSME appointment:  (1 week core 2)

## 2015-01-15 ENCOUNTER — Ambulatory Visit: Payer: Self-pay | Admitting: Gastroenterology

## 2015-01-19 ENCOUNTER — Ambulatory Visit: Payer: Self-pay | Admitting: Physician Assistant

## 2015-01-20 DIAGNOSIS — E119 Type 2 diabetes mellitus without complications: Secondary | ICD-10-CM | POA: Diagnosis not present

## 2015-01-20 NOTE — Progress Notes (Signed)

## 2015-01-27 DIAGNOSIS — E119 Type 2 diabetes mellitus without complications: Secondary | ICD-10-CM | POA: Diagnosis not present

## 2015-01-27 NOTE — Progress Notes (Signed)
Patient was seen on 01/27/15 for the third of a series of three diabetes self-management courses at [facility].   Chad Rivas. State the amount of activity recommended for healthy living . Describe activities suitable for individual needs . Identify ways to regularly incorporate activity into daily life . Identify barriers to activity and ways to over come these barriers  Identify diabetes medications being personally used and their primary action for lowering glucose and possible side effects . Describe role of stress on blood glucose and develop strategies to address psychosocial issues . Identify diabetes complications and ways to prevent them  Explain how to manage diabetes during illness . Evaluate success in meeting personal goal . Establish 2-3 goals that they will plan to diligently work on until they return for the  5610-month follow-up visit  Goals:   I will count my carb choices at most meals and snacks  I will be active 5 minutes or more 4 times a week  Your patient has identified these potential barriers to change:  Motivation Stress  Your patient has identified their diabetes self-care support plan as  Family Support On-line Resources Plan:  Attend Optional Core 4 in 4 months

## 2015-05-12 ENCOUNTER — Other Ambulatory Visit: Payer: Self-pay

## 2015-05-12 DIAGNOSIS — T7840XS Allergy, unspecified, sequela: Secondary | ICD-10-CM

## 2015-05-12 MED ORDER — FAMOTIDINE 20 MG PO TABS: 20.0000 mg | ORAL_TABLET | Freq: Two times a day (BID) | ORAL | Status: DC

## 2015-05-12 NOTE — Telephone Encounter (Signed)
LM ADVISING PT OF RX SENT

## 2015-05-12 NOTE — Telephone Encounter (Signed)
Patient contacted the pharmacy and was told he was out of refills for famotidine. Patient is wondering can we send in some more refills.   [facility] [address]  Thanks

## 2015-05-27 ENCOUNTER — Ambulatory Visit: Payer: Self-pay

## 2015-06-02 ENCOUNTER — Telehealth: Payer: Self-pay | Admitting: Allergy and Immunology

## 2015-06-02 NOTE — Telephone Encounter (Signed)
[place] IS LOOKING FOR RECEIPT

## 2015-06-02 NOTE — Telephone Encounter (Signed)
He says that he received a statement for an amount that he has already paid. Can you please look at this for him and call him.

## 2015-06-03 NOTE — Telephone Encounter (Signed)
FOUND RECEIPT - WILL APPLY PMT

## 2016-09-07 ENCOUNTER — Encounter (HOSPITAL_BASED_OUTPATIENT_CLINIC_OR_DEPARTMENT_OTHER): Payer: Self-pay | Admitting: *Deleted

## 2016-09-07 ENCOUNTER — Emergency Department (HOSPITAL_BASED_OUTPATIENT_CLINIC_OR_DEPARTMENT_OTHER)
Admission: EM | Admit: 2016-09-07 | Discharge: 2016-09-07 | Disposition: A | Discharge status: Home / Self Care | Payer: 59 | Attending: Emergency Medicine | Admitting: Emergency Medicine

## 2016-09-07 ENCOUNTER — Emergency Department (HOSPITAL_BASED_OUTPATIENT_CLINIC_OR_DEPARTMENT_OTHER): Payer: 59

## 2016-09-07 DIAGNOSIS — E119 Type 2 diabetes mellitus without complications: Secondary | ICD-10-CM | POA: Insufficient documentation

## 2016-09-07 DIAGNOSIS — Z79899 Other long term (current) drug therapy: Secondary | ICD-10-CM | POA: Diagnosis not present

## 2016-09-07 DIAGNOSIS — R197 Diarrhea, unspecified: Secondary | ICD-10-CM | POA: Diagnosis not present

## 2016-09-07 DIAGNOSIS — R05 Cough: Secondary | ICD-10-CM | POA: Diagnosis present

## 2016-09-07 DIAGNOSIS — R509 Fever, unspecified: Secondary | ICD-10-CM | POA: Insufficient documentation

## 2016-09-07 DIAGNOSIS — Z7984 Long term (current) use of oral hypoglycemic drugs: Secondary | ICD-10-CM | POA: Insufficient documentation

## 2016-09-07 DIAGNOSIS — Z96641 Presence of right artificial hip joint: Secondary | ICD-10-CM | POA: Diagnosis not present

## 2016-09-07 DIAGNOSIS — R1084 Generalized abdominal pain: Secondary | ICD-10-CM | POA: Insufficient documentation

## 2016-09-07 HISTORY — DX: Type 2 diabetes mellitus without complications: E11.9

## 2016-09-07 LAB — COMPREHENSIVE METABOLIC PANEL
ALK PHOS: 112 U/L (ref 38–126)
ALT: 45 U/L (ref 17–63)
ANION GAP: 9 (ref 5–15)
AST: 60 U/L — ABNORMAL HIGH (ref 15–41)
Albumin: 3.3 g/dL — ABNORMAL LOW (ref 3.5–5.0)
BILIRUBIN TOTAL: 0.7 mg/dL (ref 0.3–1.2)
BUN: 11 mg/dL (ref 6–20)
CALCIUM: 8.6 mg/dL — AB (ref 8.9–10.3)
CO2: 28 mmol/L (ref 22–32)
CREATININE: 0.88 mg/dL (ref 0.61–1.24)
Chloride: 95 mmol/L — ABNORMAL LOW (ref 101–111)
Glucose, Bld: 207 mg/dL — ABNORMAL HIGH (ref 65–99)
Potassium: 3.4 mmol/L — ABNORMAL LOW (ref 3.5–5.1)
SODIUM: 132 mmol/L — AB (ref 135–145)
TOTAL PROTEIN: 7.8 g/dL (ref 6.5–8.1)

## 2016-09-07 LAB — CBC WITH DIFFERENTIAL/PLATELET
Basophils Absolute: 0 10*3/uL (ref 0.0–0.1)
Basophils Relative: 1 %
EOS ABS: 0 10*3/uL (ref 0.0–0.7)
EOS PCT: 1 %
HCT: 37.5 % — ABNORMAL LOW (ref 39.0–52.0)
HEMOGLOBIN: 12.3 g/dL — AB (ref 13.0–17.0)
LYMPHS ABS: 1.6 10*3/uL (ref 0.7–4.0)
Lymphocytes Relative: 25 %
MCH: 24.7 pg — AB (ref 26.0–34.0)
MCHC: 32.8 g/dL (ref 30.0–36.0)
MCV: 75.5 fL — AB (ref 78.0–100.0)
MONOS PCT: 10 %
Monocytes Absolute: 0.7 10*3/uL (ref 0.1–1.0)
Neutro Abs: 4.1 10*3/uL (ref 1.7–7.7)
Neutrophils Relative %: 63 %
PLATELETS: 232 10*3/uL (ref 150–400)
RBC: 4.97 MIL/uL (ref 4.22–5.81)
RDW: 14.5 % (ref 11.5–15.5)
WBC: 6.4 10*3/uL (ref 4.0–10.5)

## 2016-09-07 LAB — URINALYSIS, MICROSCOPIC (REFLEX)
RBC / HPF: NONE SEEN RBC/hpf (ref 0–5)
WBC UA: NONE SEEN WBC/hpf (ref 0–5)

## 2016-09-07 LAB — URINALYSIS, ROUTINE W REFLEX MICROSCOPIC
BILIRUBIN URINE: NEGATIVE
Glucose, UA: 250 mg/dL — AB
Hgb urine dipstick: NEGATIVE
KETONES UR: 15 mg/dL — AB
Leukocytes, UA: NEGATIVE
NITRITE: NEGATIVE
PROTEIN: 30 mg/dL — AB
Specific Gravity, Urine: 1.025 (ref 1.005–1.030)
pH: 6 (ref 5.0–8.0)

## 2016-09-07 LAB — LIPASE, BLOOD: LIPASE: 28 U/L (ref 11–51)

## 2016-09-07 MED ORDER — LOPERAMIDE HCL 2 MG PO TABS: 2.0000 mg | ORAL_TABLET | Freq: Four times a day (QID) | ORAL | 0 refills | Status: DC | PRN

## 2016-09-07 MED ORDER — IOPAMIDOL (ISOVUE-300) INJECTION 61%
135.0000 mL | Freq: Once | INTRAVENOUS | Status: AC | PRN
  Administered 2016-09-07: 135 mL via INTRAVENOUS

## 2016-09-07 MED ORDER — ONDANSETRON HCL 4 MG/2ML IJ SOLN
4.0000 mg | Freq: Once | INTRAMUSCULAR | Status: AC
  Administered 2016-09-07: 4 mg via INTRAVENOUS
  Filled 2016-09-07: qty 2

## 2016-09-07 MED ORDER — ONDANSETRON 4 MG PO TBDP: 4.0000 mg | ORAL_TABLET | Freq: Three times a day (TID) | ORAL | 1 refills | Status: DC | PRN

## 2016-09-07 MED ORDER — SODIUM CHLORIDE 0.9 % IV BOLUS (SEPSIS)
1000.0000 mL | Freq: Once | INTRAVENOUS | Status: AC
  Administered 2016-09-07: 1000 mL via INTRAVENOUS

## 2016-09-07 MED ORDER — SODIUM CHLORIDE 0.9 % IV SOLN
INTRAVENOUS | Status: DC
  Administered 2016-09-07: 19:00:00 via INTRAVENOUS

## 2016-09-07 NOTE — ED Provider Notes (Signed)
[facility] Provider Note   CSN: [id number] Arrival date & time: 09/07/16  1641     History   Chief Complaint Chief Complaint  Patient presents with  . Cough  . Fever    HPI Chad Rivas is a 51 y.o. male.  Patient had pulled pork and ribs 2 days following this he had some diarrhea. Associated body aches and fatigue. Patient felt like he had the flu. Some slight tightness in the chest for the past 2 days resolved now. No diarrhea in the past 2 days. Patient had some nausea rare vomiting mostly diarrhea. No blood in the diarrhea. Associated with some abdominal cramping. That has persisted.      Past Medical History:  Diagnosis Date  . Diabetes mellitus without complication (HCC)     There are no active problems to display for this patient.   Past Surgical History:  Procedure Laterality Date  . JOINT REPLACEMENT  2009   R hip       Home Medications    Prior to Admission medications   Medication Sig Start Date End Date Taking? Authorizing Provider  Cetirizine HCl (ZYRTEC PO) Take by mouth.   Yes [provider]  diphenhydrAMINE (BENADRYL) 25 MG tablet Take 1 tablet (25 mg total) by mouth every 6 (six) hours as needed for itching (Rash). 10/03/14  Yes Roxy HorsemanBrowning, Robert, PA-C  EPINEPHrine (EPIPEN 2-PAK) 0.3 mg/0.3 mL IJ SOAJ injection Inject 0.3 mLs (0.3 mg total) into the muscle once. 12/10/14  Yes Kozlow, Alvira PhilipsEric J, MD  famotidine (PEPCID) 20 MG tablet Take 1 tablet (20 mg total) by mouth 2 (two) times daily. 05/12/15  Yes Kozlow, Alvira PhilipsEric J, MD  ibuprofen (ADVIL,MOTRIN) 200 MG tablet Take 800 mg by mouth every 6 (six) hours as needed (for pain.).   Yes [provider]  metFORMIN (GLUCOPHAGE) 500 MG tablet Take 500 mg by mouth 2 (two) times daily with a meal.   Yes [provider]  loperamide (IMODIUM A-D) 2 MG tablet Take 1 tablet (2 mg total) by mouth 4 (four) times daily as needed for diarrhea or loose stools. 09/07/16   Vanetta MuldersZackowski, Keyonia Gluth,  MD  loratadine (CLARITIN) 10 MG tablet TAKE TWO TABLETS ONCE DAILY. 12/10/14   Kozlow, Alvira PhilipsEric J, MD  ondansetron (ZOFRAN ODT) 4 MG disintegrating tablet Take 1 tablet (4 mg total) by mouth every 8 (eight) hours as needed. 09/07/16   Vanetta MuldersZackowski, Arthi Mcdonald, MD  predniSONE (DELTASONE) 20 MG tablet Take 2 tablets (40 mg total) by mouth daily. Patient not taking: Reported on 12/10/2014 10/03/14   Roxy HorsemanBrowning, Robert, PA-C    Family History No family history on file.  Social History Social History  Substance Use Topics  . Smoking status: Never Smoker  . Smokeless tobacco: Never Used  . Alcohol use No     Allergies   Penicillins   Review of Systems Review of Systems  Constitutional: Positive for fever.  HENT: Negative for congestion.   Eyes: Negative for redness.  Respiratory: Negative for shortness of breath.   Cardiovascular: Positive for chest pain.  Gastrointestinal: Positive for abdominal pain, diarrhea, nausea and vomiting.  Genitourinary: Negative for dysuria.  Musculoskeletal: Positive for arthralgias and myalgias.  Skin: Positive for rash.  Neurological: Negative for headaches.  Hematological: Does not bruise/bleed easily.  Psychiatric/Behavioral: Negative for confusion.     Physical Exam Updated Vital Signs BP (!) 105/50 (BP Location: Left Arm)   Pulse 72   Temp 98.2 F (36.8 C) (Oral)   Resp  18   Ht 1.778 m (5\' 10" )   Wt (!) 156.5 kg (345 lb)   SpO2 95%   BMI 49.50 kg/m   Physical Exam  Constitutional: He is oriented to person, place, and time. He appears well-developed and well-nourished. No distress.  HENT:  Head: Normocephalic and atraumatic.  Mouth/Throat: Oropharynx is clear and moist.  Eyes: EOM are normal. Pupils are equal, round, and reactive to light.  Neck: Normal range of motion. Neck supple.  Cardiovascular: Normal rate, regular rhythm and normal heart sounds.   Pulmonary/Chest: Effort normal and breath sounds normal.  Abdominal: Soft. Bowel sounds are  normal.  Musculoskeletal: Normal range of motion.  Neurological: He is alert and oriented to person, place, and time. No cranial nerve deficit. He exhibits normal muscle tone. Coordination normal.  Skin: Skin is warm.  Nursing note and vitals reviewed.    ED Treatments / Results  Labs (all labs ordered are listed, but only abnormal results are displayed) Labs Reviewed  COMPREHENSIVE METABOLIC PANEL - Abnormal; Notable for the following:       Result Value   Sodium 132 (*)    Potassium 3.4 (*)    Chloride 95 (*)    Glucose, Bld 207 (*)    Calcium 8.6 (*)    Albumin 3.3 (*)    AST 60 (*)    All other components within normal limits  URINALYSIS, ROUTINE W REFLEX MICROSCOPIC - Abnormal; Notable for the following:    Color, Urine AMBER (*)    Glucose, UA 250 (*)    Ketones, ur 15 (*)    Protein, ur 30 (*)    All other components within normal limits  URINALYSIS, MICROSCOPIC (REFLEX) - Abnormal; Notable for the following:    Bacteria, UA RARE (*)    Squamous Epithelial / LPF 0-5 (*)    All other components within normal limits  CBC WITH DIFFERENTIAL/PLATELET - Abnormal; Notable for the following:    Hemoglobin 12.3 (*)    HCT 37.5 (*)    MCV 75.5 (*)    MCH 24.7 (*)    All other components within normal limits  LIPASE, BLOOD  CBC WITH DIFFERENTIAL/PLATELET    EKG  EKG Interpretation  Date/Time:  Wednesday September 07 2016 16:58:44 EDT Ventricular Rate:  94 PR Interval:    QRS Duration: 92 QT Interval:  342 QTC Calculation: 428 R Axis:   -13 Text Interpretation:  Sinus rhythm Abnormal R-wave progression, late transition No previous ECGs available Confirmed by Vanetta Mulders [phone]) on 09/07/2016 5:05:17 PM       Radiology Dg Chest 2 View  Result Date: 09/07/2016 CLINICAL DATA:  Cough and fever EXAM: CHEST  2 VIEW COMPARISON:  None. FINDINGS: Normal heart size and mediastinal contours. No acute infiltrate or edema. No effusion or pneumothorax. No acute osseous findings.  EKG leads create artifact over the chest. IMPRESSION: Negative chest. Electronically Signed   By: Marnee Spring M.D.   On: 09/07/2016 17:39   Ct Abdomen Pelvis W Contrast  Result Date: 09/07/2016 CLINICAL DATA:  Diarrhea x2 days after eating cold pork and ribs. Abdominal pain and fever. EXAM: CT ABDOMEN AND PELVIS WITH CONTRAST TECHNIQUE: Multidetector CT imaging of the abdomen and pelvis was performed using the standard protocol following bolus administration of intravenous contrast. CONTRAST:  ISOVUE-300 IOPAMIDOL (ISOVUE-300) INJECTION 61% COMPARISON:  None. FINDINGS: Lower chest: There are 2 pleural-based densities in the left lower lobe measuring 4 and 8 mm. These are nonspecific and may be  postinfectious or postinflammatory in etiology. Pulmonary nodule not entirely excluded. No effusion or pneumothorax. The included heart is normal in size. Hepatobiliary: Hepatic steatosis. Unremarkable gallbladder. No hepatic mass or biliary dilatation. Pancreas: Normal Spleen: Normal Adrenals/Urinary Tract: Normal bilateral adrenal glands kidneys. The urinary bladder is unremarkable though partially obscured by the patient's right hip arthroplasty. No obstructive uropathy. Stomach/Bowel: Contracted stomach. There is normal small bowel rotation without small bowel dilatation, fold thickening nor obstruction. Normal-appearing appendix. A moderate amount of stool is seen within large bowel with scattered colonic diverticulosis. No acute diverticulitis nor evidence of colitis. Vascular/Lymphatic: No aortic aneurysm or dissection. No lymphadenopathy by CT size criteria. Reproductive: Normal size prostate with central zone calcifications. Other: Tiny fat containing umbilical hernia. Musculoskeletal: No acute nor suspicious osseous lesions. IMPRESSION: 1. Pleural-based nodular densities at the left lung base measuring 4 and 8 mm. Non-contrast chest CT at 3-6 months is recommended. If the nodules are stable at time of  repeat CT, then future CT at 18-24 months (from today's scan) is considered optional for low-risk patients, but is recommended for high-risk patients. This recommendation follows the consensus statement: Guidelines for Management of Incidental Pulmonary Nodules Detected on CT Images: From the Fleischner Society 2017; Radiology 2017; 284:228-243. 2. Contracted stomach. Scattered colonic diverticulosis without acute diverticulitis. No bowel inflammation obstruction. 3. Hepatic steatosis. Electronically Signed   By: Tollie Eth M.D.   On: 09/07/2016 20:29    Procedures Procedures (including critical care time)  Medications Ordered in ED Medications  0.9 %  sodium chloride infusion ( Intravenous New Bag/Given 09/07/16 1907)  sodium chloride 0.9 % bolus 1,000 mL (0 mLs Intravenous Stopped 09/07/16 1908)  ondansetron (ZOFRAN) injection 4 mg (4 mg Intravenous Given 09/07/16 1758)  iopamidol (ISOVUE-300) 61 % injection 135 mL (135 mLs Intravenous Contrast Given 09/07/16 1950)     Initial Impression / Assessment and Plan / ED Course  I have reviewed the triage vital signs and the nursing notes.  Pertinent labs & imaging results that were available during my care of the patient were reviewed by me and considered in my medical decision making (see chart for details).     Workup for the diarrheal illness and abdominal pain without acute findings. There was an incidental finding of a left-sided pulmonary nodule which will require close follow-up with CT chest in 3 months.  Patient's labs without significant abnormalities. Has some subtle changes in potassium sodium and did have elevation of glucose to as a known history of diabetes.  Store clean based on the symptoms which started out predominant with a diarrheal illness and body aches and some myalgias highly suggestive of food poisoning.  For this would treat with Imodium and Zofran. Would expect improvement over the next few days.  Patient also with a  complaint of some discomfort in the chest. But this did not seem to be cardiac in nature. EKG without acute changes. Chest x-ray was negative for pneumonia pneumothorax. Chest area did not show the pulmonary nodule concern on the CT of the belly.  Final Clinical Impressions(s) / ED Diagnoses   Final diagnoses:  Generalized abdominal pain  Diarrhea of presumed infectious origin    New Prescriptions New Prescriptions   LOPERAMIDE (IMODIUM A-D) 2 MG TABLET    Take 1 tablet (2 mg total) by mouth 4 (four) times daily as needed for diarrhea or loose stools.   ONDANSETRON (ZOFRAN ODT) 4 MG DISINTEGRATING TABLET    Take 1 tablet (4 mg total) by mouth every  8 (eight) hours as needed.     Vanetta Mulders, MD 09/07/16 2217

## 2016-09-07 NOTE — Discharge Instructions (Signed)
Workup without significant findings from the CT scan other than concerns for a left lower lung pulmonary nodule which will require follow-up CT scan of the chest in 3 months. Make an appointment to follow-up with your regular doctor. Also follow-up soon regarding the diarrhea illness which we suspect is probably food poisoning to make sure you improve and have your labs retested. Take the Imodium for diarrhea take Zofran as needed for nausea and vomiting. Work note provided. Return for any new or worse symptoms.

## 2016-09-07 NOTE — ED Notes (Signed)
Pt. Reports diarrhea for 2 days after eating pulled pork and ribs.  Pt. Also reports abd. Pain and fever.  Pt. In no resp. Distress.

## 2016-09-07 NOTE — ED Triage Notes (Signed)
Cough for a week. Fever for a couple of days. Body aches and fatigue. Tightness in his chest x 2 days.

## 2016-09-26 ENCOUNTER — Other Ambulatory Visit: Payer: Self-pay | Admitting: Internal Medicine

## 2016-09-26 DIAGNOSIS — R911 Solitary pulmonary nodule: Secondary | ICD-10-CM

## 2017-01-11 ENCOUNTER — Ambulatory Visit
Admission: RE | Admit: 2017-01-11 | Discharge: 2017-01-11 | Disposition: A | Discharge status: Home / Self Care | Payer: 59 | Source: Ambulatory Visit | Attending: Internal Medicine | Admitting: Internal Medicine

## 2017-01-11 DIAGNOSIS — R911 Solitary pulmonary nodule: Secondary | ICD-10-CM

## 2017-01-20 ENCOUNTER — Other Ambulatory Visit: Payer: Self-pay | Admitting: Internal Medicine

## 2017-01-20 DIAGNOSIS — R918 Other nonspecific abnormal finding of lung field: Secondary | ICD-10-CM

## 2017-07-03 ENCOUNTER — Emergency Department (HOSPITAL_BASED_OUTPATIENT_CLINIC_OR_DEPARTMENT_OTHER)
Admission: EM | Admit: 2017-07-03 | Discharge: 2017-07-03 | Disposition: A | Discharge status: Home / Self Care | Payer: 59 | Attending: Emergency Medicine | Admitting: Emergency Medicine

## 2017-07-03 ENCOUNTER — Encounter (HOSPITAL_BASED_OUTPATIENT_CLINIC_OR_DEPARTMENT_OTHER): Payer: Self-pay

## 2017-07-03 ENCOUNTER — Other Ambulatory Visit: Payer: Self-pay

## 2017-07-03 ENCOUNTER — Emergency Department (HOSPITAL_BASED_OUTPATIENT_CLINIC_OR_DEPARTMENT_OTHER): Payer: 59

## 2017-07-03 DIAGNOSIS — R079 Chest pain, unspecified: Secondary | ICD-10-CM | POA: Diagnosis present

## 2017-07-03 DIAGNOSIS — E119 Type 2 diabetes mellitus without complications: Secondary | ICD-10-CM | POA: Insufficient documentation

## 2017-07-03 DIAGNOSIS — R0789 Other chest pain: Secondary | ICD-10-CM

## 2017-07-03 DIAGNOSIS — Z96641 Presence of right artificial hip joint: Secondary | ICD-10-CM | POA: Diagnosis not present

## 2017-07-03 DIAGNOSIS — Z7984 Long term (current) use of oral hypoglycemic drugs: Secondary | ICD-10-CM | POA: Insufficient documentation

## 2017-07-03 LAB — CBC WITH DIFFERENTIAL/PLATELET
Basophils Absolute: 0 10*3/uL (ref 0.0–0.1)
Basophils Relative: 0 %
Eosinophils Absolute: 0.2 10*3/uL (ref 0.0–0.7)
Eosinophils Relative: 2 %
HEMATOCRIT: 40.2 % (ref 39.0–52.0)
HEMOGLOBIN: 13.2 g/dL (ref 13.0–17.0)
LYMPHS ABS: 3.3 10*3/uL (ref 0.7–4.0)
Lymphocytes Relative: 32 %
MCH: 24.9 pg — AB (ref 26.0–34.0)
MCHC: 32.8 g/dL (ref 30.0–36.0)
MCV: 75.8 fL — ABNORMAL LOW (ref 78.0–100.0)
MONOS PCT: 6 %
Monocytes Absolute: 0.6 10*3/uL (ref 0.1–1.0)
NEUTROS ABS: 6 10*3/uL (ref 1.7–7.7)
NEUTROS PCT: 60 %
Platelets: 320 10*3/uL (ref 150–400)
RBC: 5.3 MIL/uL (ref 4.22–5.81)
RDW: 14.5 % (ref 11.5–15.5)
WBC: 10.1 10*3/uL (ref 4.0–10.5)

## 2017-07-03 LAB — COMPREHENSIVE METABOLIC PANEL
ALK PHOS: 77 U/L (ref 38–126)
ALT: 31 U/L (ref 17–63)
AST: 29 U/L (ref 15–41)
Albumin: 3.5 g/dL (ref 3.5–5.0)
Anion gap: 12 (ref 5–15)
BILIRUBIN TOTAL: 0.7 mg/dL (ref 0.3–1.2)
BUN: 13 mg/dL (ref 6–20)
CALCIUM: 8.7 mg/dL — AB (ref 8.9–10.3)
CO2: 25 mmol/L (ref 22–32)
Chloride: 95 mmol/L — ABNORMAL LOW (ref 101–111)
Creatinine, Ser: 0.91 mg/dL (ref 0.61–1.24)
Glucose, Bld: 334 mg/dL — ABNORMAL HIGH (ref 65–99)
Potassium: 3.5 mmol/L (ref 3.5–5.1)
Sodium: 132 mmol/L — ABNORMAL LOW (ref 135–145)
TOTAL PROTEIN: 7.7 g/dL (ref 6.5–8.1)

## 2017-07-03 LAB — TROPONIN I: Troponin I: <0.03 ng/mL (ref <0.03)

## 2017-07-03 MED ORDER — GI COCKTAIL ~~LOC~~
30.0000 mL | Freq: Once | ORAL | Status: AC
  Administered 2017-07-03: 30 mL via ORAL
  Filled 2017-07-03: qty 30

## 2017-07-03 NOTE — Discharge Instructions (Signed)
You are evaluated in the emergency department for some chest discomfort and some difficulty swallowing.  You had blood work and x-ray and EKG and there was no evidence that this was a cardiac injury.  This is possibly reflux and you should try Maalox in between meals and at bedtime.  Also avoid eating late at night before bed.  Please follow-up with your doctor for further evaluation and return to the emergency department if any worsening symptoms.

## 2017-07-03 NOTE — ED Provider Notes (Signed)
[facility] EMERGENCY DEPARTMENT Provider Note   CSN: [id number] Arrival date & time: 07/03/17  1835     History   Chief Complaint Chief Complaint  Patient presents with  . Chest Pain    HPI CURTISS MAHMOOD is a 52 y.o. male.  He is a history diabetes and no prior cardiac history.  He presents today with chest pain for 3 to 4 days.  He says that the burning sensation in the center of his chest that radiates up into his left shoulder and left neck.  It does not seem to be exertional in nature.  It is worsened with eating food and he gets a sensation that food will stick in that area in the center of his chest before it goes down.  He does have a history of some reflux symptoms and takes Pepcid.  There is been no change in his diet.  The chest pain is associated with a little bit of shortness of breath.  It is not associated with any diaphoresis dizziness nausea or vomiting.  Is been no fever or cough.  There is been no prolonged plane rides or immobilization and no recent surgeries.  The history is provided by the patient.  Chest Pain   This is a new problem. The current episode started more than 2 days ago. The problem occurs constantly. The problem has not changed since onset.The pain is associated with eating. The pain is present in the substernal region. The pain is mild. The quality of the pain is described as burning. The pain radiates to the left jaw and left neck. Exacerbated by: eating. Associated symptoms include shortness of breath. Pertinent negatives include no abdominal pain, no back pain, no cough, no diaphoresis, no dizziness, no exertional chest pressure, no fever, no leg pain, no lower extremity edema, no nausea, no orthopnea, no palpitations, no PND, no sputum production, no syncope, no vomiting and no weakness. He has tried nothing for the symptoms. The treatment provided no relief. Risk factors include male gender.  His past medical history is significant for  diabetes.  Pertinent negatives for past medical history include no seizures.    Past Medical History:  Diagnosis Date  . Diabetes mellitus without complication (HCC)     There are no active problems to display for this patient.   Past Surgical History:  Procedure Laterality Date  . JOINT REPLACEMENT  2009   R hip  . TOTAL HIP ARTHROPLASTY          Home Medications    Prior to Admission medications   Medication Sig Start Date End Date Taking? Authorizing Provider  Cetirizine HCl (ZYRTEC PO) Take by mouth.    [provider]  diphenhydrAMINE (BENADRYL) 25 MG tablet Take 1 tablet (25 mg total) by mouth every 6 (six) hours as needed for itching (Rash). 10/03/14   Roxy Horseman, PA-C  EPINEPHrine (EPIPEN 2-PAK) 0.3 mg/0.3 mL IJ SOAJ injection Inject 0.3 mLs (0.3 mg total) into the muscle once. 12/10/14   Kozlow, Alvira Philips, MD  famotidine (PEPCID) 20 MG tablet Take 1 tablet (20 mg total) by mouth 2 (two) times daily. 05/12/15   Kozlow, Alvira Philips, MD  ibuprofen (ADVIL,MOTRIN) 200 MG tablet Take 800 mg by mouth every 6 (six) hours as needed (for pain.).    [provider]  loperamide (IMODIUM A-D) 2 MG tablet Take 1 tablet (2 mg total) by mouth 4 (four) times daily as needed for diarrhea or loose stools. 09/07/16  Vanetta Mulders, MD  loratadine (CLARITIN) 10 MG tablet TAKE TWO TABLETS ONCE DAILY. 12/10/14   Kozlow, Alvira Philips, MD  metFORMIN (GLUCOPHAGE) 500 MG tablet Take 500 mg by mouth 2 (two) times daily with a meal.    [provider]  ondansetron (ZOFRAN ODT) 4 MG disintegrating tablet Take 1 tablet (4 mg total) by mouth every 8 (eight) hours as needed. 09/07/16   Vanetta Mulders, MD  predniSONE (DELTASONE) 20 MG tablet Take 2 tablets (40 mg total) by mouth daily. Patient not taking: Reported on 12/10/2014 10/03/14   Roxy Horseman, PA-C    Family History No family history on file.  Social History Social History   Tobacco Use  . Smoking status: Never Smoker    . Smokeless tobacco: Never Used  Substance Use Topics  . Alcohol use: No  . Drug use: No     Allergies   Penicillins   Review of Systems Review of Systems  Constitutional: Negative for chills, diaphoresis and fever.  HENT: Negative for ear pain and sore throat.   Eyes: Negative for pain and visual disturbance.  Respiratory: Positive for shortness of breath. Negative for cough and sputum production.   Cardiovascular: Positive for chest pain. Negative for palpitations, orthopnea, syncope and PND.  Gastrointestinal: Negative for abdominal pain, nausea and vomiting.  Genitourinary: Negative for dysuria and hematuria.  Musculoskeletal: Negative for arthralgias and back pain.  Skin: Negative for color change and rash.  Neurological: Negative for dizziness, seizures, syncope and weakness.  All other systems reviewed and are negative.    Physical Exam Updated Vital Signs BP (!) 138/94 (BP Location: Left Arm)   Pulse 87   Temp 98.4 F (36.9 C) (Oral)   Resp 18   Ht  (1.778 m)   Wt (!) 152.8 kg (336 lb 13.8 oz)   SpO2 96%   BMI 48.33 kg/m   Physical Exam  Constitutional: He appears well-developed and well-nourished.  HENT:  Head: Normocephalic and atraumatic.  Eyes: Conjunctivae are normal.  Neck: Neck supple.  Cardiovascular: Normal rate and regular rhythm.  No murmur heard. Pulmonary/Chest: Effort normal and breath sounds normal. No respiratory distress.  Abdominal: Soft. There is no tenderness.  Musculoskeletal: He exhibits no edema.       Right lower leg: He exhibits no tenderness.       Left lower leg: He exhibits no tenderness.  Neurological: He is alert.  Skin: Skin is warm and dry.  Psychiatric: He has a normal mood and affect.  Nursing note and vitals reviewed.    ED Treatments / Results  Labs (all labs ordered are listed, but only abnormal results are displayed) Labs Reviewed  CBC WITH DIFFERENTIAL/PLATELET - Abnormal; Notable for the following  components:      Result Value   MCV 75.8 (*)    MCH 24.9 (*)    All other components within normal limits  COMPREHENSIVE METABOLIC PANEL - Abnormal; Notable for the following components:   Sodium 132 (*)    Chloride 95 (*)    Glucose, Bld 334 (*)    Calcium 8.7 (*)    All other components within normal limits  TROPONIN I    EKG EKG Interpretation  Date/Time:  Monday July 03 2017 18:48:13 EDT Ventricular Rate:  85 PR Interval:    QRS Duration: 96 QT Interval:  358 QTC Calculation: 426 R Axis:   -30 Text Interpretation:  Sinus rhythm Left axis deviation similar to prior ecg 7/18 Confirmed by Meridee Score (  [id number]) on 07/03/2017 7:11:08 PM   Radiology Dg Chest 2 View  Result Date: 07/03/2017 CLINICAL DATA:  Chest pain x 4 days; burning sensation in the center of his chest that radiates up into his left shoulder and left neck; DM EXAM: CHEST - 2 VIEW COMPARISON:  CT 01/11/2017 and previous FINDINGS: Lungs are clear. Heart size and mediastinal contours are within normal limits. No effusion. Visualized bones unremarkable. IMPRESSION: No acute cardiopulmonary disease. Electronically Signed   By: Corlis Leak M.D.   On: 07/03/2017 19:24    Procedures Procedures (including critical care time)  Medications Ordered in ED Medications  gi cocktail (Maalox,Lidocaine,Donnatal) (30 mLs Oral Given 07/03/17 1857)     Initial Impression / Assessment and Plan / ED Course  I have reviewed the triage vital signs and the nursing notes.  Pertinent labs & imaging results that were available during my care of the patient were reviewed by me and considered in my medical decision making (see chart for details).  Clinical Course as of Jul 05 1708  Mon Jul 03, 2017  2106 Mr. Sorn had gotten a GI cocktail and he is feeling much improved.  He is eating now and swallowing and had noticing no difficulty with food catching.  I think this is going to be unlikely to be cardiac as is been going on  honestly since Friday from what he tells me and his troponin is unremarkable.  I do not think he needs serial tropes.  He is going to do Maalox between meals and at bedtime and follow-up with his primary care doctor.   [MB]    Clinical Course User Index [MB] Terrilee Files, MD    Final Clinical Impressions(s) / ED Diagnoses   Final diagnoses:  Atypical chest pain    ED Discharge Orders    None       Terrilee Files, MD 07/05/17 1710

## 2017-07-03 NOTE — ED Notes (Signed)
ED Provider at bedside. 

## 2017-07-03 NOTE — ED Triage Notes (Signed)
C/o CP X 4 days

## 2017-07-05 DIAGNOSIS — Z87438 Personal history of other diseases of male genital organs: Secondary | ICD-10-CM

## 2017-07-05 HISTORY — DX: Personal history of other diseases of male genital organs: Z87.438

## 2017-07-13 ENCOUNTER — Inpatient Hospital Stay (HOSPITAL_COMMUNITY): Payer: 59 | Admitting: Certified Registered Nurse Anesthetist

## 2017-07-13 ENCOUNTER — Emergency Department (HOSPITAL_BASED_OUTPATIENT_CLINIC_OR_DEPARTMENT_OTHER): Payer: 59

## 2017-07-13 ENCOUNTER — Encounter (HOSPITAL_COMMUNITY)
Admission: EM | Disposition: A | Discharge status: Home / Self Care | Payer: Self-pay | Source: Home / Self Care | Attending: Urology

## 2017-07-13 ENCOUNTER — Other Ambulatory Visit: Payer: Self-pay

## 2017-07-13 ENCOUNTER — Encounter (HOSPITAL_BASED_OUTPATIENT_CLINIC_OR_DEPARTMENT_OTHER): Payer: Self-pay

## 2017-07-13 ENCOUNTER — Observation Stay (HOSPITAL_BASED_OUTPATIENT_CLINIC_OR_DEPARTMENT_OTHER)
Admission: EM | Admit: 2017-07-13 | Discharge: 2017-07-14 | Disposition: A | Discharge status: Home / Self Care | Payer: 59 | Attending: Urology | Admitting: Urology

## 2017-07-13 DIAGNOSIS — Z79899 Other long term (current) drug therapy: Secondary | ICD-10-CM | POA: Diagnosis not present

## 2017-07-13 DIAGNOSIS — E1165 Type 2 diabetes mellitus with hyperglycemia: Secondary | ICD-10-CM | POA: Diagnosis not present

## 2017-07-13 DIAGNOSIS — R739 Hyperglycemia, unspecified: Secondary | ICD-10-CM

## 2017-07-13 DIAGNOSIS — N493 Fournier gangrene: Secondary | ICD-10-CM

## 2017-07-13 DIAGNOSIS — R0789 Other chest pain: Secondary | ICD-10-CM | POA: Diagnosis not present

## 2017-07-13 DIAGNOSIS — Z880 Allergy status to penicillin: Secondary | ICD-10-CM | POA: Insufficient documentation

## 2017-07-13 DIAGNOSIS — G4733 Obstructive sleep apnea (adult) (pediatric): Secondary | ICD-10-CM | POA: Diagnosis not present

## 2017-07-13 DIAGNOSIS — R079 Chest pain, unspecified: Secondary | ICD-10-CM

## 2017-07-13 DIAGNOSIS — Z6841 Body Mass Index (BMI) 40.0 and over, adult: Secondary | ICD-10-CM | POA: Insufficient documentation

## 2017-07-13 DIAGNOSIS — R000 Tachycardia, unspecified: Secondary | ICD-10-CM | POA: Insufficient documentation

## 2017-07-13 DIAGNOSIS — E119 Type 2 diabetes mellitus without complications: Secondary | ICD-10-CM | POA: Diagnosis present

## 2017-07-13 DIAGNOSIS — Z7984 Long term (current) use of oral hypoglycemic drugs: Secondary | ICD-10-CM | POA: Insufficient documentation

## 2017-07-13 DIAGNOSIS — A419 Sepsis, unspecified organism: Secondary | ICD-10-CM

## 2017-07-13 DIAGNOSIS — L02214 Cutaneous abscess of groin: Principal | ICD-10-CM | POA: Insufficient documentation

## 2017-07-13 DIAGNOSIS — Z96641 Presence of right artificial hip joint: Secondary | ICD-10-CM | POA: Insufficient documentation

## 2017-07-13 HISTORY — DX: Sleep apnea, unspecified: G47.30

## 2017-07-13 HISTORY — PX: IRRIGATION AND DEBRIDEMENT ABSCESS: SHX5252

## 2017-07-13 LAB — CBC
HEMATOCRIT: 38.7 % — AB (ref 39.0–52.0)
HEMOGLOBIN: 12.9 g/dL — AB (ref 13.0–17.0)
MCH: 25.1 pg — ABNORMAL LOW (ref 26.0–34.0)
MCHC: 33.3 g/dL (ref 30.0–36.0)
MCV: 75.3 fL — AB (ref 78.0–100.0)
Platelets: 284 10*3/uL (ref 150–400)
RBC: 5.14 MIL/uL (ref 4.22–5.81)
RDW: 14.3 % (ref 11.5–15.5)
WBC: 15.8 10*3/uL — ABNORMAL HIGH (ref 4.0–10.5)

## 2017-07-13 LAB — BASIC METABOLIC PANEL
Anion gap: 14 (ref 5–15)
BUN: 9 mg/dL (ref 6–20)
CO2: 23 mmol/L (ref 22–32)
Calcium: 8.6 mg/dL — ABNORMAL LOW (ref 8.9–10.3)
Chloride: 92 mmol/L — ABNORMAL LOW (ref 101–111)
Creatinine, Ser: 1.01 mg/dL (ref 0.61–1.24)
GFR calc Af Amer: >60 mL/min (ref >60)
GFR calc non Af Amer: >60 mL/min (ref >60)
GLUCOSE: 494 mg/dL — AB (ref 65–99)
Potassium: 3.8 mmol/L (ref 3.5–5.1)
Sodium: 129 mmol/L — ABNORMAL LOW (ref 135–145)

## 2017-07-13 LAB — GLUCOSE, CAPILLARY
Glucose-Capillary: 308 mg/dL — ABNORMAL HIGH (ref 65–99)
Glucose-Capillary: 395 mg/dL — ABNORMAL HIGH (ref 65–99)
Glucose-Capillary: 379 mg/dL — ABNORMAL HIGH (ref 65–99)
Glucose-Capillary: 397 mg/dL — ABNORMAL HIGH (ref 65–99)

## 2017-07-13 LAB — I-STAT CG4 LACTIC ACID, ED: Lactic Acid, Venous: 1.72 mmol/L (ref 0.5–1.9)

## 2017-07-13 LAB — D-DIMER, QUANTITATIVE: D-Dimer, Quant: 1.15 ug/mL-FEU — ABNORMAL HIGH (ref 0.00–0.50)

## 2017-07-13 LAB — TROPONIN I: Troponin I: <0.03 ng/mL (ref <0.03)

## 2017-07-13 SURGERY — IRRIGATION AND DEBRIDEMENT ABSCESS
Anesthesia: General | Laterality: Right

## 2017-07-13 MED ORDER — DEXAMETHASONE SODIUM PHOSPHATE 10 MG/ML IJ SOLN
INTRAMUSCULAR | Status: AC
  Filled 2017-07-13: qty 1

## 2017-07-13 MED ORDER — HYDROMORPHONE HCL 1 MG/ML IJ SOLN: 0.2500 mg | INTRAMUSCULAR | Status: DC | PRN

## 2017-07-13 MED ORDER — VANCOMYCIN HCL IN DEXTROSE 1-5 GM/200ML-% IV SOLN
INTRAVENOUS | Status: AC
  Filled 2017-07-13: qty 400

## 2017-07-13 MED ORDER — PROPOFOL 10 MG/ML IV BOLUS
INTRAVENOUS | Status: DC | PRN
  Administered 2017-07-13: 50 mg via INTRAVENOUS
  Administered 2017-07-13: 250 mg via INTRAVENOUS

## 2017-07-13 MED ORDER — IOPAMIDOL (ISOVUE-370) INJECTION 76%
100.0000 mL | Freq: Once | INTRAVENOUS | Status: AC | PRN
  Administered 2017-07-13: 100 mL via INTRAVENOUS

## 2017-07-13 MED ORDER — VANCOMYCIN HCL 1000 MG IV SOLR
INTRAVENOUS | Status: AC
  Filled 2017-07-13: qty 2000

## 2017-07-13 MED ORDER — ROCURONIUM BROMIDE 10 MG/ML (PF) SYRINGE
PREFILLED_SYRINGE | INTRAVENOUS | Status: AC
Start: 2017-07-13 — End: ?
  Filled 2017-07-13: qty 5

## 2017-07-13 MED ORDER — LIDOCAINE 2% (20 MG/ML) 5 ML SYRINGE
INTRAMUSCULAR | Status: AC
  Filled 2017-07-13: qty 5

## 2017-07-13 MED ORDER — CIPROFLOXACIN IN D5W 400 MG/200ML IV SOLN
400.0000 mg | Freq: Once | INTRAVENOUS | Status: AC
  Administered 2017-07-13: 400 mg via INTRAVENOUS
  Filled 2017-07-13: qty 200

## 2017-07-13 MED ORDER — SUCCINYLCHOLINE CHLORIDE 200 MG/10ML IV SOSY
PREFILLED_SYRINGE | INTRAVENOUS | Status: AC
  Filled 2017-07-13: qty 10

## 2017-07-13 MED ORDER — INSULIN DETEMIR 100 UNIT/ML ~~LOC~~ SOLN
5.0000 [IU] | Freq: Every day | SUBCUTANEOUS | Status: DC
  Administered 2017-07-13: 5 [IU] via SUBCUTANEOUS
  Filled 2017-07-13 (×2): qty 0.05

## 2017-07-13 MED ORDER — CLINDAMYCIN PHOSPHATE 300 MG/50ML IV SOLN
INTRAVENOUS | Status: AC
  Filled 2017-07-13: qty 50

## 2017-07-13 MED ORDER — INSULIN ASPART 100 UNIT/ML ~~LOC~~ SOLN: 0.0000 [IU] | Freq: Three times a day (TID) | SUBCUTANEOUS | Status: DC

## 2017-07-13 MED ORDER — ONDANSETRON HCL 4 MG/2ML IJ SOLN: 4.0000 mg | Freq: Four times a day (QID) | INTRAMUSCULAR | Status: DC | PRN

## 2017-07-13 MED ORDER — HYDROCODONE-ACETAMINOPHEN 5-325 MG PO TABS
1.0000 | ORAL_TABLET | Freq: Four times a day (QID) | ORAL | Status: DC | PRN
  Administered 2017-07-13 – 2017-07-14 (×3): 2 via ORAL
  Filled 2017-07-13 (×3): qty 2

## 2017-07-13 MED ORDER — VANCOMYCIN HCL IN DEXTROSE 1-5 GM/200ML-% IV SOLN
1000.0000 mg | Freq: Once | INTRAVENOUS | Status: DC
Start: 2017-07-13 — End: 2017-07-13

## 2017-07-13 MED ORDER — ONDANSETRON HCL 4 MG/2ML IJ SOLN
INTRAMUSCULAR | Status: DC | PRN
  Administered 2017-07-13: 4 mg via INTRAVENOUS

## 2017-07-13 MED ORDER — SODIUM CHLORIDE 0.9 % IV BOLUS
1000.0000 mL | Freq: Once | INTRAVENOUS | Status: AC
  Administered 2017-07-13: 1000 mL via INTRAVENOUS

## 2017-07-13 MED ORDER — ONDANSETRON HCL 4 MG/2ML IJ SOLN
INTRAMUSCULAR | Status: AC
  Filled 2017-07-13: qty 2

## 2017-07-13 MED ORDER — INSULIN ASPART 100 UNIT/ML ~~LOC~~ SOLN
0.0000 [IU] | SUBCUTANEOUS | Status: DC
  Administered 2017-07-13: 15 [IU] via SUBCUTANEOUS
  Administered 2017-07-14: 8 [IU] via SUBCUTANEOUS
  Administered 2017-07-14: 15 [IU] via SUBCUTANEOUS

## 2017-07-13 MED ORDER — MIDAZOLAM HCL 2 MG/2ML IJ SOLN
INTRAMUSCULAR | Status: AC
  Filled 2017-07-13: qty 2

## 2017-07-13 MED ORDER — FAMOTIDINE 20 MG PO TABS
20.0000 mg | ORAL_TABLET | Freq: Two times a day (BID) | ORAL | Status: DC
  Administered 2017-07-13 – 2017-07-14 (×2): 20 mg via ORAL
  Filled 2017-07-13 (×2): qty 1

## 2017-07-13 MED ORDER — VANCOMYCIN HCL IN DEXTROSE 1-5 GM/200ML-% IV SOLN
1000.0000 mg | Freq: Two times a day (BID) | INTRAVENOUS | Status: AC
  Administered 2017-07-14: 1000 mg via INTRAVENOUS
  Filled 2017-07-13: qty 200

## 2017-07-13 MED ORDER — FENTANYL CITRATE (PF) 250 MCG/5ML IJ SOLN
INTRAMUSCULAR | Status: AC
  Filled 2017-07-13: qty 5

## 2017-07-13 MED ORDER — MIDAZOLAM HCL 5 MG/5ML IJ SOLN
INTRAMUSCULAR | Status: DC | PRN
  Administered 2017-07-13: 2 mg via INTRAVENOUS

## 2017-07-13 MED ORDER — VANCOMYCIN HCL 10 G IV SOLR
2000.0000 mg | Freq: Once | INTRAVENOUS | Status: AC
  Administered 2017-07-13: 2000 mg via INTRAVENOUS
  Filled 2017-07-13: qty 2000

## 2017-07-13 MED ORDER — FENTANYL CITRATE (PF) 100 MCG/2ML IJ SOLN
INTRAMUSCULAR | Status: DC | PRN
  Administered 2017-07-13 (×5): 50 ug via INTRAVENOUS

## 2017-07-13 MED ORDER — INSULIN ASPART 100 UNIT/ML ~~LOC~~ SOLN
10.0000 [IU] | Freq: Once | SUBCUTANEOUS | Status: AC
  Administered 2017-07-13: 10 [IU] via SUBCUTANEOUS

## 2017-07-13 MED ORDER — CLINDAMYCIN PHOSPHATE 900 MG/50ML IV SOLN
900.0000 mg | Freq: Once | INTRAVENOUS | Status: AC
  Administered 2017-07-13: 300 mg via INTRAVENOUS

## 2017-07-13 MED ORDER — OXYCODONE HCL 5 MG PO TABS: 5.0000 mg | ORAL_TABLET | Freq: Once | ORAL | Status: DC | PRN

## 2017-07-13 MED ORDER — SODIUM CHLORIDE 0.9 % IV SOLN
INTRAVENOUS | Status: DC
Start: 2017-07-13 — End: 2017-07-14
  Administered 2017-07-13: 23:00:00 via INTRAVENOUS

## 2017-07-13 MED ORDER — PROPOFOL 10 MG/ML IV BOLUS
INTRAVENOUS | Status: AC
  Filled 2017-07-13: qty 20

## 2017-07-13 MED ORDER — CLINDAMYCIN PHOSPHATE 900 MG/50ML IV SOLN
900.0000 mg | Freq: Three times a day (TID) | INTRAVENOUS | Status: DC
  Administered 2017-07-13 – 2017-07-14 (×2): 900 mg via INTRAVENOUS
  Filled 2017-07-13 (×3): qty 50

## 2017-07-13 MED ORDER — ONDANSETRON HCL 4 MG/2ML IJ SOLN: 4.0000 mg | INTRAMUSCULAR | Status: DC | PRN

## 2017-07-13 MED ORDER — ACETAMINOPHEN 325 MG PO TABS: 650.0000 mg | ORAL_TABLET | ORAL | Status: DC | PRN

## 2017-07-13 MED ORDER — LIDOCAINE 2% (20 MG/ML) 5 ML SYRINGE
INTRAMUSCULAR | Status: DC | PRN
  Administered 2017-07-13: 100 mg via INTRAVENOUS

## 2017-07-13 MED ORDER — SODIUM CHLORIDE 0.9 % IR SOLN
Status: DC | PRN
  Administered 2017-07-13: 3000 mL

## 2017-07-13 MED ORDER — CIPROFLOXACIN IN D5W 400 MG/200ML IV SOLN
400.0000 mg | Freq: Two times a day (BID) | INTRAVENOUS | Status: AC
  Administered 2017-07-14: 400 mg via INTRAVENOUS
  Filled 2017-07-13: qty 200

## 2017-07-13 MED ORDER — OXYCODONE HCL 5 MG/5ML PO SOLN
5.0000 mg | Freq: Once | ORAL | Status: DC | PRN
  Filled 2017-07-13: qty 5

## 2017-07-13 MED ORDER — METRONIDAZOLE IN NACL 5-0.79 MG/ML-% IV SOLN
500.0000 mg | Freq: Once | INTRAVENOUS | Status: AC
  Administered 2017-07-13: 500 mg via INTRAVENOUS
  Filled 2017-07-13: qty 100

## 2017-07-13 MED ORDER — SODIUM CHLORIDE 0.9 % IV SOLN
INTRAVENOUS | Status: DC | PRN
  Administered 2017-07-13: 19:00:00 via INTRAVENOUS

## 2017-07-13 MED ORDER — CLINDAMYCIN PHOSPHATE 600 MG/50ML IV SOLN
600.0000 mg | Freq: Once | INTRAVENOUS | Status: AC
  Administered 2017-07-13: 600 mg via INTRAVENOUS
  Filled 2017-07-13: qty 50

## 2017-07-13 SURGICAL SUPPLY — 35 items
BENZOIN TINCTURE PRP APPL 2/3 (GAUZE/BANDAGES/DRESSINGS) IMPLANT
BNDG GAUZE ELAST 4 BULKY (GAUZE/BANDAGES/DRESSINGS) IMPLANT
COVER SURGICAL LIGHT HANDLE (MISCELLANEOUS) ×2 IMPLANT
DERMABOND ADVANCED (GAUZE/BANDAGES/DRESSINGS)
DERMABOND ADVANCED .7 DNX12 (GAUZE/BANDAGES/DRESSINGS) IMPLANT
DISSECTOR ROUND CHERRY 3/8 STR (MISCELLANEOUS) IMPLANT
DRAIN PENROSE 18X1/2 LTX STRL (DRAIN) ×4 IMPLANT
DRAIN PENROSE 18X1/4 LTX STRL (WOUND CARE) IMPLANT
DRAPE LAPAROTOMY T 98X78 PEDS (DRAPES) IMPLANT
ELECT REM PT RETURN 15FT ADLT (MISCELLANEOUS) ×2 IMPLANT
GAUZE 4X4 16PLY RFD (DISPOSABLE) IMPLANT
GAUZE SPONGE 4X4 12PLY STRL (GAUZE/BANDAGES/DRESSINGS) IMPLANT
GLOVE BIOGEL M 8.0 STRL (GLOVE) ×12 IMPLANT
GOWN STRL REUS W/TWL XL LVL3 (GOWN DISPOSABLE) ×6 IMPLANT
KIT BASIN OR (CUSTOM PROCEDURE TRAY) ×2 IMPLANT
LEGGING LITHOTOMY PAIR STRL (DRAPES) ×2 IMPLANT
NEEDLE HYPO 22GX1.5 SAFETY (NEEDLE) IMPLANT
NS IRRIG 1000ML POUR BTL (IV SOLUTION) ×4 IMPLANT
PACK GENERAL/GYN (CUSTOM PROCEDURE TRAY) ×2 IMPLANT
STRIP CLOSURE SKIN 1/2X4 (GAUZE/BANDAGES/DRESSINGS) IMPLANT
SUPPORT SCROTAL LG STRP (MISCELLANEOUS) IMPLANT
SUT CHROMIC 2 0 SH (SUTURE) IMPLANT
SUT ETHILON 3 0 PS 1 (SUTURE) ×4 IMPLANT
SUT MNCRL AB 4-0 PS2 18 (SUTURE) IMPLANT
SUT SILK 0 (SUTURE)
SUT SILK 0 30XBRD TIE 6 (SUTURE) IMPLANT
SUT SILK 2 0 (SUTURE)
SUT SILK 2-0 18XBRD TIE 12 (SUTURE) IMPLANT
SUT SILK 3 0 SH 30 (SUTURE) IMPLANT
SUT VIC AB 2-0 SH 27 (SUTURE)
SUT VIC AB 2-0 SH 27X BRD (SUTURE) IMPLANT
SUT VIC AB 3-0 SH 27 (SUTURE)
SUT VIC AB 3-0 SH 27XBRD (SUTURE) IMPLANT
SYR CONTROL 10ML LL (SYRINGE) ×2 IMPLANT
TOWEL OR 17X26 10 PK STRL BLUE (TOWEL DISPOSABLE) ×2 IMPLANT

## 2017-07-13 NOTE — ED Notes (Signed)
Attempt x 3 to obtain additional IV access and blood cx; IV was not established, but 2nd blood cx was obtained.

## 2017-07-13 NOTE — ED Notes (Signed)
Report to Archie Patten, Charity fundraiser in [employer].

## 2017-07-13 NOTE — Anesthesia Procedure Notes (Signed)
Procedure Name: LMA Insertion Date/Time: 07/13/2017 7:04 PM Performed by: Khamila Bassinger D, CRNA Pre-anesthesia Checklist: Patient identified, Emergency Drugs available, Suction available and Patient being monitored Patient Re-evaluated:Patient Re-evaluated prior to induction Oxygen Delivery Method: Circle system utilized Preoxygenation: Pre-oxygenation with 100% oxygen Induction Type: IV induction Ventilation: Mask ventilation without difficulty LMA: LMA inserted LMA Size: 5.0 Tube type: Oral Number of attempts: 1 Placement Confirmation: positive ETCO2 and breath sounds checked- equal and bilateral Tube secured with: Tape Dental Injury: Teeth and Oropharynx as per pre-operative assessment

## 2017-07-13 NOTE — ED Notes (Signed)
Patient transported to CT 

## 2017-07-13 NOTE — ED Notes (Signed)
Family took all of patient's belongings home prior to transport

## 2017-07-13 NOTE — Op Note (Addendum)
Preoperative Diagnosis: Groin Abcess   Postoperative Diagnosis: Same  Procedure(s) Performed:  Procedure(s): INCISION AND DRAINAGE OF GROIN ABSCESS - Left   Surgeon:  Surgeon(s): Ihor Gully, MD  Resident Surgeon: Sydnee Levans, MD, MD  Assistant(s):  NOne  Anesthesia:  General  Fluids:  See anesthesia record  Estimated blood loss:  Minimal   Specimens:   ID Type Source Tests Collected by Time Destination  A : right groin abscess Abscess Abscess AEROBIC/ANAEROBIC CULTURE (SURGICAL/DEEP WOUND) Ihor Gully, MD 07/13/2017 1920     Drains:  2 Penrose in left groin   Complications:  None  Indications:  This is a 52 y.o. patient with a history of left groin abscess with subcutaneous air. After discussion of the risks & benefits and alternatives to surgical approach, the patient wishes to proceed with incision and debridement. Risks & benefits of the procedure discussed with the patient, who wishes to proceed.   Findings:   - Large pus pocket deep to area of induration in left groin  - Well away from penis and left testicle  - Abscess cavity irrigated copiously and 2 penrose drains left to span the entirety of the cavity  - Wound dressed with WtD  Procedure:  The patient was properly identified in the holding room. He received preoperative IV antibiotics. He was then taken to the operating room where general anesthetic was administered using the LMA. The scrotum and inguinal region was then prepped and draped in the usual manner. Appropriate surgical timeout was performed.   We began with a 4 cm longitudinal incision in the area of greatest induration in the left groin.  Using Kelly forceps we popped into an abscess cavity and delivered copious amounts of purulence.  The cavity was cultured.  We then extended our incision to further open the cavity and broke up internal loculations.  Then the cavity was pulse lavaged and copiously irrigated with sterile water.  Subsequently we  obtained hemostasis.  After this 2 Penrose drains were placed in the superior and inferior aspect of the incision exiting out of the inguinal wound, this was sutured in place with nylon.  Lastly the cavity was packed with moist Kerlix.  Dry Kerlix was placed over top.  Patient was awoken from anesthesia and recovered in the PACU without difficulty  Plan: 1. Admit to urology  2. Labs in AM 3. PRN pain meds  4. Appreciate hospitalist assistance with glycemic control  5. Possible home tomorrow    I was scrubbed in and participated with the assistance of the resident in the entire case.

## 2017-07-13 NOTE — Consult Note (Addendum)
[facility] Medical Consultation  Chad Rivas ZOX:[id number] DOB: May 04, 1965 DOA: 07/13/2017 PCP: Marden Noble, MD   Requesting physician: Dr. Ihor Gully Date of consultation: 07/13/2017 Reason for consultation: Glycemic control  Impression/Recommendations Active Problems:   Abscess of groin, left    1. Abscess of left groin /Early Fournier's gangrene: Patient presented with pain and swelling of the left groin area.  Imaging studies showed findings concerning for Fournier's gangrene.  Patient was taken for I&D by urology.  - Per urology  2. Hyperglycemia and diabetes mellitus type 2: Acute.  Patient's initial blood glucose was 494 on admission without signs of a elevated anion gap.  Patient was also noted to have pseudohyponatremia with sodium of 129.  Sodium within normal limits when adjusted for hyperglycemia.  Suspect acute symptoms secondary to acute stress stress and underlying infection.  - Hypoglycemic protocols  - Hold metformin  - Check hemoglobin A1c in a.m.  - Levemir 5 units nightly  - CBGs q 4hrs with moderate SSI every 4 hours overnight  - Reassess and adjust dosage as needed  - Recommend outpatient follow-up with PCP if sugars remain high at home on home regimen   3. Morbid obesity: BMI equal to 46.8  - Counseled on need to weight loss    I will follow up again tomorrow. Please contact me if I can be of assistance in the meanwhile. Thank you for this consultation.  Chief Complaint: Pain and swelling in Groin  HPI:  Chad Rivas is a 52 y.o. male with medical history significant of DM type II, morbid obesity, and OSA not on CPAP; who presents with complaints of pain and swelling of the left groin region since 5 PM yesterday afternoon.  Patient was seen at [facility] and noted to have significant gas in the cutaneous tissue involving the left perineal region concerning for edema versus abscess.  Patient was admitted to Dr. Vernie Ammons  of the urology service and taken  for I&D.  Lab work initially revealed sodium 129 and glucose of 494 with anion gap 14.  Patient is unsure of last hemoglobin A1c and normally reports blood sugars being in the 90s on metformin.   Review of Systems  Cardiovascular: Positive for chest pain (Now resolved). Negative for leg swelling.  Genitourinary: Positive for frequency. Negative for dysuria.       Positive for groin pain and swelling     Past Medical History:  Diagnosis Date  . Diabetes mellitus without complication (HCC)   . Sleep apnea    pt has CPAP but does not use    Past Surgical History:  Procedure Laterality Date  . JOINT REPLACEMENT  2009   R hip  . TOE SURGERY    . TOTAL HIP ARTHROPLASTY     Social History:  reports that he has never smoked. He has never used smokeless tobacco. He reports that he does not drink alcohol or use drugs.  Allergies  Allergen Reactions  . Penicillins Anaphylaxis   History reviewed. No pertinent family history.  Prior to Admission medications   Medication Sig Start Date End Date Taking? Authorizing Provider  Cetirizine HCl (ZYRTEC PO) Take by mouth.   Yes [provider]  ibuprofen (ADVIL,MOTRIN) 200 MG tablet Take 800 mg by mouth every 6 (six) hours as needed (for pain.).   Yes [provider]  loperamide (IMODIUM A-D) 2 MG tablet Take 1 tablet (2 mg total) by mouth 4 (four) times daily as needed for diarrhea or loose  stools. 09/07/16  Yes Vanetta Mulders, MD  loratadine (CLARITIN) 10 MG tablet TAKE TWO TABLETS ONCE DAILY. 12/10/14  Yes Kozlow, Alvira Philips, MD  metFORMIN (GLUCOPHAGE) 500 MG tablet Take 500 mg by mouth 2 (two) times daily with a meal.   Yes [provider]  naproxen sodium (ALEVE) 220 MG tablet Take 220 mg by mouth as needed.   Yes [provider]  diphenhydrAMINE (BENADRYL) 25 MG tablet Take 1 tablet (25 mg total) by mouth every 6 (six) hours as needed for itching (Rash). 10/03/14   Roxy Horseman, PA-C  EPINEPHrine (EPIPEN  2-PAK) 0.3 mg/0.3 mL IJ SOAJ injection Inject 0.3 mLs (0.3 mg total) into the muscle once. 12/10/14   Kozlow, Alvira Philips, MD  famotidine (PEPCID) 20 MG tablet Take 1 tablet (20 mg total) by mouth 2 (two) times daily. 05/12/15   Kozlow, Alvira Philips, MD   Physical Exam:  Constitutional: Obese male in NAD, calm, comfortable Vitals:   07/13/17 1506 07/13/17 1515 07/13/17 1622 07/13/17 2000  BP:  122/76 (!) 155/89 (!) 141/48  Pulse:  (!) 108 (!) 108 (!) 113  Resp:  (!) 24 16 (!) 22  Temp: 98.8 F (37.1 C)  98.3 F (36.8 C)   TempSrc: Oral  Oral   SpO2:  93% 96% 93%  Weight:      Height:       Eyes: PERRL, lids and conjunctivae normal ENMT: Mucous membranes are moist. Posterior pharynx clear of any exudate or lesions. Normal dentition.  Neck: normal, supple, no masses, no thyromegaly Respiratory: clear to auscultation bilaterally, no wheezing, no crackles. Normal respiratory effort. No accessory muscle use.  Cardiovascular: Regular rate and rhythm, no murmurs / rubs / gallops. No extremity edema. 2+ pedal pulses. No carotid bruits.  Abdomen: no tenderness, no masses palpated. No hepatosplenomegaly. Bowel sounds positive.  Musculoskeletal: no clubbing / cyanosis. No joint deformity upper and lower extremities. Good ROM, no contractures. Normal muscle tone.  Skin: Groin wound bandaged  neurologic: CN 2-12 grossly intact. Sensation intact, DTR normal. Strength 5/5 in all 4.  Psychiatric: Normal judgment and insight. Alert and oriented x 3. Normal mood.   Labs on Admission:  Basic Metabolic Panel: Recent Labs  Lab 07/13/17 1258  NA 129*  K 3.8  CL 92*  CO2 23  GLUCOSE 494*  BUN 9  CREATININE 1.01  CALCIUM 8.6*   Liver Function Tests: No results for input(s): AST, ALT, ALKPHOS, BILITOT, PROT, ALBUMIN in the last 168 hours. No results for input(s): LIPASE, AMYLASE in the last 168 hours. No results for input(s): AMMONIA in the last 168 hours. CBC: Recent Labs  Lab 07/13/17 1258  WBC 15.8*   HGB 12.9*  HCT 38.7*  MCV 75.3*  PLT 284   Cardiac Enzymes: Recent Labs  Lab 07/13/17 1258  TROPONINI <0.03   BNP: Invalid input(s): POCBNP CBG: Recent Labs  Lab 07/13/17 1627 07/13/17 2002  GLUCAP 308* 395*    Radiological Exams on Admission: Dg Chest 2 View  Result Date: 07/13/2017 CLINICAL DATA:  Chest heaviness and tightness with cough. EXAM: CHEST - 2 VIEW COMPARISON:  Chest x-ray dated July 03, 2017. FINDINGS: The heart size and mediastinal contours are within normal limits. Normal pulmonary vascularity. Bibasilar atelectasis. No focal consolidation, pleural effusion, or pneumothorax. No acute osseous abnormality. IMPRESSION: No active cardiopulmonary disease. Electronically Signed   By: Obie Dredge M.D.   On: 07/13/2017 12:14   Ct Angio Chest Pe W And/or Wo Contrast  Result Date: 07/13/2017  CLINICAL DATA:  Chest pain and shortness of breath, LEFT groin swelling, elevated white blood cell count, tachycardia, elevated D-dimer. EXAM: CT ANGIOGRAPHY CHEST CT ABDOMEN AND PELVIS WITH CONTRAST TECHNIQUE: Multidetector CT imaging of the chest was performed using the standard protocol during bolus administration of intravenous contrast. Multiplanar CT image reconstructions and MIPs were obtained to evaluate the vascular anatomy. Multidetector CT imaging of the abdomen and pelvis was performed using the standard protocol during bolus administration of intravenous contrast. CONTRAST:  ISOVUE-370 IOPAMIDOL (ISOVUE-370) INJECTION 76% COMPARISON:  CT abdomen dated 09/07/2016. FINDINGS: CTA CHEST FINDINGS Cardiovascular: Beam hardening artifact limits characterization of the most peripheral segmental and subsegmental pulmonary arteries to each lung, however, there is no obstructing pulmonary embolism identified within the main, lobar or segmental pulmonary arteries bilaterally. No thoracic aortic aneurysm or evidence of aortic dissection. Heart size is normal. No pericardial effusion.  Mediastinum/Nodes: No mass or enlarged lymph nodes within the mediastinum or perihilar regions. Esophagus appears normal. Trachea and central bronchi are unremarkable. Lungs/Pleura: Single pleural based nodule at the LEFT lung base, measuring 9 mm (series 6, image 58), not significantly changed compared to the previous chest CT of 09/07/2016. Lungs otherwise clear. No pleural effusion or pneumothorax. Musculoskeletal: No acute or suspicious osseous finding Review of the MIP images confirms the above findings. CT ABDOMEN and PELVIS FINDINGS Hepatobiliary: Liver is diffusely low in density indicating fatty infiltration. No focal mass or lesion identified in the liver. Gallbladder appears normal. No bile duct dilatation seen. Pancreas: Unremarkable. No pancreatic ductal dilatation or surrounding inflammatory changes. Spleen: Normal in size without focal abnormality. Adrenals/Urinary Tract: Adrenal glands appear normal. Kidneys appear normal without mass, stone or hydronephrosis. No perinephric inflammation. No ureteral or bladder calculi identified. Bladder is unremarkable, although portion of the bladder and of the adjacent distal RIGHT ureter are obscured by metallic artifact emanating from patient's RIGHT hip arthroplasty hardware. Stomach/Bowel: Bowel is normal in caliber. No bowel wall thickening or evidence of bowel wall inflammation seen. Appendix is normal. Stomach is unremarkable, decompressed. Vascular/Lymphatic: No significant vascular findings are present. No enlarged abdominal or pelvic lymph nodes. Presumably reactive lymph nodes in the LEFT groin. Reproductive: Extensive fluid/edema within the scrotal walls bilaterally. Extensive soft tissue gas extending from the base of the scrotum to the LEFT perineum suggesting Fournier's gangrene. No intrapelvic extension. Other: No free fluid or abscess collection within the intraperitoneal abdomen or pelvis. No free intraperitoneal air. Musculoskeletal: No acute or  suspicious osseous finding. Review of the MIP images confirms the above findings. IMPRESSION: 1. Extensive soft tissue edema/fluid throughout the scrotal walls bilaterally, with associated edema extending into the subcutaneous soft tissues of the upper LEFT thigh, with additional soft tissue gas extending from the base of the scrotum into the LEFT perineum, overall findings highly suggestive of Fournier's gangrene. No circumscribed abscess collection identified within the superficial soft tissues. No intrapelvic (intraperitoneal) extension. 2. No acute findings within the chest. No pulmonary embolism identified, with mild study limitations detailed above. No pneumonia or pulmonary edema. 3. Pleural based nodule within the LEFT lower lobe, measuring 9 mm, not significantly changed compared to previous CT of 09/07/2016. Given the short-term stability, follow-up CT at 18-24 months (from today's scan) is considered optional for low-risk patients, but is recommended for high-risk patients. This recommendation follows the consensus statement: Guidelines for Management of Incidental Pulmonary Nodules Detected on CT Images: From the Fleischner Society 2017; Radiology 2017; 284:228-243. 4. Fatty infiltration of the liver. Electronically Signed   By: Weyman Croon  Linde Gillis M.D.   On: 07/13/2017 14:36   Ct Abdomen Pelvis W Contrast  Result Date: 07/13/2017 CLINICAL DATA:  Chest pain and shortness of breath, LEFT groin swelling, elevated white blood cell count, tachycardia, elevated D-dimer. EXAM: CT ANGIOGRAPHY CHEST CT ABDOMEN AND PELVIS WITH CONTRAST TECHNIQUE: Multidetector CT imaging of the chest was performed using the standard protocol during bolus administration of intravenous contrast. Multiplanar CT image reconstructions and MIPs were obtained to evaluate the vascular anatomy. Multidetector CT imaging of the abdomen and pelvis was performed using the standard protocol during bolus administration of intravenous contrast.  CONTRAST:  ISOVUE-370 IOPAMIDOL (ISOVUE-370) INJECTION 76% COMPARISON:  CT abdomen dated 09/07/2016. FINDINGS: CTA CHEST FINDINGS Cardiovascular: Beam hardening artifact limits characterization of the most peripheral segmental and subsegmental pulmonary arteries to each lung, however, there is no obstructing pulmonary embolism identified within the main, lobar or segmental pulmonary arteries bilaterally. No thoracic aortic aneurysm or evidence of aortic dissection. Heart size is normal. No pericardial effusion. Mediastinum/Nodes: No mass or enlarged lymph nodes within the mediastinum or perihilar regions. Esophagus appears normal. Trachea and central bronchi are unremarkable. Lungs/Pleura: Single pleural based nodule at the LEFT lung base, measuring 9 mm (series 6, image 58), not significantly changed compared to the previous chest CT of 09/07/2016. Lungs otherwise clear. No pleural effusion or pneumothorax. Musculoskeletal: No acute or suspicious osseous finding Review of the MIP images confirms the above findings. CT ABDOMEN and PELVIS FINDINGS Hepatobiliary: Liver is diffusely low in density indicating fatty infiltration. No focal mass or lesion identified in the liver. Gallbladder appears normal. No bile duct dilatation seen. Pancreas: Unremarkable. No pancreatic ductal dilatation or surrounding inflammatory changes. Spleen: Normal in size without focal abnormality. Adrenals/Urinary Tract: Adrenal glands appear normal. Kidneys appear normal without mass, stone or hydronephrosis. No perinephric inflammation. No ureteral or bladder calculi identified. Bladder is unremarkable, although portion of the bladder and of the adjacent distal RIGHT ureter are obscured by metallic artifact emanating from patient's RIGHT hip arthroplasty hardware. Stomach/Bowel: Bowel is normal in caliber. No bowel wall thickening or evidence of bowel wall inflammation seen. Appendix is normal. Stomach is unremarkable, decompressed.  Vascular/Lymphatic: No significant vascular findings are present. No enlarged abdominal or pelvic lymph nodes. Presumably reactive lymph nodes in the LEFT groin. Reproductive: Extensive fluid/edema within the scrotal walls bilaterally. Extensive soft tissue gas extending from the base of the scrotum to the LEFT perineum suggesting Fournier's gangrene. No intrapelvic extension. Other: No free fluid or abscess collection within the intraperitoneal abdomen or pelvis. No free intraperitoneal air. Musculoskeletal: No acute or suspicious osseous finding. Review of the MIP images confirms the above findings. IMPRESSION: 1. Extensive soft tissue edema/fluid throughout the scrotal walls bilaterally, with associated edema extending into the subcutaneous soft tissues of the upper LEFT thigh, with additional soft tissue gas extending from the base of the scrotum into the LEFT perineum, overall findings highly suggestive of Fournier's gangrene. No circumscribed abscess collection identified within the superficial soft tissues. No intrapelvic (intraperitoneal) extension. 2. No acute findings within the chest. No pulmonary embolism identified, with mild study limitations detailed above. No pneumonia or pulmonary edema. 3. Pleural based nodule within the LEFT lower lobe, measuring 9 mm, not significantly changed compared to previous CT of 09/07/2016. Given the short-term stability, follow-up CT at 18-24 months (from today's scan) is considered optional for low-risk patients, but is recommended for high-risk patients. This recommendation follows the consensus statement: Guidelines for Management of Incidental Pulmonary Nodules Detected on CT Images: From the  Fleischner Society 2017; Radiology 2017; 284:228-243. 4. Fatty infiltration of the liver. Electronically Signed   By: Bary Richard M.D.   On: 07/13/2017 14:36    EKG: Independently reviewed.  Sinus tachycardia 126 bpm  Time spent: <45 minutes  Traivon Morrical A Katrinka Blazing [facility] Pager [phone]  If 7PM-7AM, please contact night-coverage [email or web address] Password [email or web address] 07/13/2017, 8:40 PM

## 2017-07-13 NOTE — ED Provider Notes (Signed)
Medical screening examination/treatment/procedure(s) were conducted as a shared visit with non-physician practitioner(s) and myself.  I personally evaluated the patient during the encounter. Briefly, the patient is a 52 y.o. male here with atypical chest pain highly inconsistent with ACS.  EKG without acute ischemic changes or evidence of pericarditis.  Did reveal sinus tachycardia.  Low pretest probability for pulmonary embolism and CTA was negative.  Additionally patient endorsed several days of left scrotal swelling.  History of diabetes.  Work-up revealed evidence suggestive of Fournier's disease.  Code sepsis was initiated on the patient and he was started on empiric triple antibiotic treatment.  Case was discussed with urology for surgical intervention.      EKG Interpretation  Date/Time:  Thursday Jul 13 2017 11:41:17 EDT Ventricular Rate:  124 PR Interval:  154 QRS Duration: 86 QT Interval:  304 QTC Calculation: 436 R Axis:   -108 Text Interpretation:  Sinus tachycardia Right superior axis deviation Possible Anterior infarct , age undetermined Abnormal ECG NO STEMI Confirmed by Drema Pry [phone]) on 07/13/2017 2:48:56 PM         Chad Rivas, Amadeo Garnet, MD 07/13/17 1454

## 2017-07-13 NOTE — ED Provider Notes (Signed)
[facility] EMERGENCY DEPARTMENT Provider Note   CSN: [id number] Arrival date & time: 07/13/17  1134     History   Chief Complaint Chief Complaint  Patient presents with  . Chest Pain    HPI Chad Rivas is a 52 y.o. male.  Patient with history of diabetes, obesity --presents the emergency department today with continued chest pressure ongoing for the past several days.  Patient states that this does radiate minimally into his back and shoulders.  He states that he has felt very fatigued.  He denies any diaphoresis or exertional pain.  Patient does state that he has been somewhat short of breath with activity, however shortness of breath has not stopped him from doing any specific activities.  No vomiting with the pain.  Symptoms are similar to when he was seen here on 4/29.  Patient was treated for GERD at that time with improvement.  He states that Maalox at home has not really been helping.  In addition patient reports 2 days of left groin swelling and redness.  The area is very sore.  No injuries to the area.  His leg is not swollen.  He does feel that his scrotum is somewhat swollen as well.  No reported fevers, nausea or vomiting.  No drainage from the area of swelling.     Past Medical History:  Diagnosis Date  . Diabetes mellitus without complication (HCC)     There are no active problems to display for this patient.   Past Surgical History:  Procedure Laterality Date  . JOINT REPLACEMENT  2009   R hip  . TOTAL HIP ARTHROPLASTY          Home Medications    Prior to Admission medications   Medication Sig Start Date End Date Taking? Authorizing Provider  Cetirizine HCl (ZYRTEC PO) Take by mouth.    [provider]  diphenhydrAMINE (BENADRYL) 25 MG tablet Take 1 tablet (25 mg total) by mouth every 6 (six) hours as needed for itching (Rash). 10/03/14   Roxy Horseman, PA-C  EPINEPHrine (EPIPEN 2-PAK) 0.3 mg/0.3 mL IJ SOAJ injection Inject 0.3  mLs (0.3 mg total) into the muscle once. 12/10/14   Kozlow, Alvira Philips, MD  famotidine (PEPCID) 20 MG tablet Take 1 tablet (20 mg total) by mouth 2 (two) times daily. 05/12/15   Kozlow, Alvira Philips, MD  ibuprofen (ADVIL,MOTRIN) 200 MG tablet Take 800 mg by mouth every 6 (six) hours as needed (for pain.).    [provider]  loperamide (IMODIUM A-D) 2 MG tablet Take 1 tablet (2 mg total) by mouth 4 (four) times daily as needed for diarrhea or loose stools. 09/07/16   Vanetta Mulders, MD  loratadine (CLARITIN) 10 MG tablet TAKE TWO TABLETS ONCE DAILY. 12/10/14   Kozlow, Alvira Philips, MD  metFORMIN (GLUCOPHAGE) 500 MG tablet Take 500 mg by mouth 2 (two) times daily with a meal.    [provider]    Family History No family history on file.  Social History Social History   Tobacco Use  . Smoking status: Never Smoker  . Smokeless tobacco: Never Used  Substance Use Topics  . Alcohol use: No  . Drug use: No     Allergies   Penicillins   Review of Systems Review of Systems  Constitutional: Positive for fatigue. Negative for diaphoresis and fever.  Eyes: Negative for redness.  Respiratory: Positive for shortness of breath. Negative for cough.   Cardiovascular: Positive for chest pain. Negative  for palpitations and leg swelling.  Gastrointestinal: Negative for abdominal pain, nausea and vomiting.  Genitourinary: Negative for dysuria.  Musculoskeletal: Positive for myalgias. Negative for back pain and neck pain.  Skin: Positive for color change. Negative for rash.  Neurological: Negative for syncope and light-headedness.  Psychiatric/Behavioral: The patient is not nervous/anxious.      Physical Exam Updated Vital Signs BP 140/87 (BP Location: Left Arm)   Pulse (!) 125   Temp 99.6 F (37.6 C) (Oral)   Resp 20   Ht  (1.778 m)   Wt (!) 148.3 kg (327 lb)   SpO2 94%   BMI 46.92 kg/m   Physical Exam  Constitutional: He appears well-developed and well-nourished.  HENT:    Head: Normocephalic and atraumatic.  Mouth/Throat: Mucous membranes are normal. Mucous membranes are not dry.  Eyes: Conjunctivae are normal.  Neck: Trachea normal and normal range of motion. Neck supple. Normal carotid pulses and no JVD present. No muscular tenderness present. Carotid bruit is not present. No tracheal deviation present.  Cardiovascular: Normal rate, regular rhythm, S1 normal, S2 normal, normal heart sounds and intact distal pulses. Exam reveals no distant heart sounds and no decreased pulses.  No murmur heard. Pulmonary/Chest: Effort normal and breath sounds normal. No respiratory distress. He has no wheezes. He exhibits no tenderness.  Abdominal: Soft. Normal aorta and bowel sounds are normal. There is no tenderness. There is no rebound and no guarding.  Genitourinary: Penis normal. Right testis shows swelling and tenderness. Right testis shows no mass. Left testis shows swelling and tenderness. Left testis shows no mass.  Genitourinary Comments: Extensive induration extending from the left anterior pelvis to the left scrotum and perineal area.  I do not feel any subcutaneous emphysema at time of exam.  Patient is moderately tender.  Area is warm.  Patient has swelling of the scrotal wall.  Musculoskeletal: He exhibits no edema.  Neurological: He is alert.  Skin: Skin is warm and dry. He is not diaphoretic. No cyanosis. No pallor.  There is approximately 8 to 10 cm of induration in the left groin with overlying erythema consistent with developing abscess potentially.  Psychiatric: He has a normal mood and affect.  Nursing note and vitals reviewed.    ED Treatments / Results  Labs (all labs ordered are listed, but only abnormal results are displayed) Labs Reviewed  BASIC METABOLIC PANEL - Abnormal; Notable for the following components:      Result Value   Sodium 129 (*)    Chloride 92 (*)    Glucose, Bld 494 (*)    Calcium 8.6 (*)    All other components within normal  limits  CBC - Abnormal; Notable for the following components:   WBC 15.8 (*)    Hemoglobin 12.9 (*)    HCT 38.7 (*)    MCV 75.3 (*)    MCH 25.1 (*)    All other components within normal limits  D-DIMER, QUANTITATIVE (NOT AT [facility]) - Abnormal; Notable for the following components:   D-Dimer, Quant 1.15 (*)    All other components within normal limits  CULTURE, BLOOD (ROUTINE X 2)  CULTURE, BLOOD (ROUTINE X 2)  TROPONIN I  I-STAT CG4 LACTIC ACID, ED    EKG None  Radiology Dg Chest 2 View  Result Date: 07/13/2017 CLINICAL DATA:  Chest heaviness and tightness with cough. EXAM: CHEST - 2 VIEW COMPARISON:  Chest x-ray dated July 03, 2017. FINDINGS: The heart size and mediastinal contours are within  normal limits. Normal pulmonary vascularity. Bibasilar atelectasis. No focal consolidation, pleural effusion, or pneumothorax. No acute osseous abnormality. IMPRESSION: No active cardiopulmonary disease. Electronically Signed   By: Obie Dredge M.D.   On: 07/13/2017 12:14    Procedures Procedures (including critical care time)  Medications Ordered in ED Medications  clindamycin (CLEOCIN) IVPB 600 mg (600 mg Intravenous New Bag/Given 07/13/17 1507)  ciprofloxacin (CIPRO) IVPB 400 mg (has no administration in time range)  metroNIDAZOLE (FLAGYL) IVPB 500 mg (has no administration in time range)  vancomycin (VANCOCIN) 2,000 mg in sodium chloride 0.9 % 500 mL IVPB (2,000 mg Intravenous New Bag/Given 07/13/17 1515)  clindamycin (CLEOCIN) IVPB 900 mg (has no administration in time range)  vancomycin (VANCOCIN) 1000 MG powder (has no administration in time range)  clindamycin (CLEOCIN) 300 MG/50ML IVPB (has no administration in time range)  sodium chloride 0.9 % bolus 1,000 mL (0 mLs Intravenous Stopped 07/13/17 1436)  iopamidol (ISOVUE-370) 76 % injection 100 mL (100 mLs Intravenous Contrast Given 07/13/17 1347)     Initial Impression / Assessment and Plan / ED Course  I have reviewed the triage  vital signs and the nursing notes.  Pertinent labs & imaging results that were available during my care of the patient were reviewed by me and considered in my medical decision making (see chart for details).     Patient seen and examined.  Cardiac work-up initiated.  D-dimer added given tachycardia and chest symptoms.  However, I suspect the patient is developing a left groin infection.  This may need imaging to further delineate.  Vital signs reviewed and are as follows: BP 120/80 (BP Location: Left Arm)   Pulse (!) 110   Temp 99.6 F (37.6 C) (Oral)   Resp 20   Ht  (1.778 m)   Wt (!) 148.3 kg (327 lb)   SpO2 95%   BMI 46.92 kg/m   2:44 PM CT reviewed. Pt with findings suggestive of Fournier's gangrene. Blood cultures ordered. Temp 98.42F. Will discuss with urology for admission, abx.   Patient discussed with Dr. Eudelia Bunch who will see.   3:07 PM Spoke with pharmacist at [facility] to confirm antibiotic regimen. Clinda  ordered and vanc  per pharmacy reccs.   CRITICAL CARE Performed by: Carolee Rota Total critical care time: 40 minutes Critical care time was exclusive of separately billable procedures and treating other patients. Critical care was necessary to treat or prevent imminent or life-threatening deterioration. Critical care was time spent personally by me on the following activities: development of treatment plan with patient and/or surrogate as well as nursing, discussions with consultants, evaluation of patient's response to treatment, examination of patient, obtaining history from patient or surrogate, ordering and performing treatments and interventions, ordering and review of laboratory studies, ordering and review of radiographic studies, pulse oximetry and re-evaluation of patient's condition.  3:10 PM Dr. Cena Benton aware of patient. He declines primary admit however will consult to address patient's medical issues and manage diabetes and chest symptoms.  Updated Dr. Vernie Ammons.   3:29 PM [facility] at bedside. They are aware Dr. Vernie Ammons will see patient in pre-op.   Final Clinical Impressions(s) / ED Diagnoses   Final diagnoses:  Fournier's gangrene  Sepsis, due to unspecified organism [facility])  Chest pain, unspecified type  Hyperglycemia   Admit.   ED Discharge Orders    None       Renne Crigler, [place] 07/13/17 1530

## 2017-07-13 NOTE — Transfer of Care (Signed)
Immediate Anesthesia Transfer of Care Note  Patient: Chad Rivas  Procedure(s) Performed: INCISION AND DRAINAGE OF GROIN ABSCESS (Right )  Patient Location: PACU  Anesthesia Type:General  Level of Consciousness: awake, alert  and oriented  Airway & Oxygen Therapy: Patient Spontanous Breathing and Patient connected to face mask oxygen  Post-op Assessment: Report given to RN and Post -op Vital signs reviewed and stable  Post vital signs: Reviewed and stable  Last Vitals:  Vitals Value Taken Time  BP 141/48 07/13/2017  8:00 PM  Temp    Pulse 109 07/13/2017  8:02 PM  Resp 20 07/13/2017  8:02 PM  SpO2 97 % 07/13/2017  8:02 PM  Vitals shown include unvalidated device data.  Last Pain:  Vitals:   07/13/17 1622  TempSrc: Oral  PainSc: 0-No pain         Complications: No apparent anesthesia complications

## 2017-07-13 NOTE — H&P (Signed)
Chad Rivas is an 52 y.o. male.   Chief Complaint: Groin swelling and pain  Assessment: Probable early Fournier's gangrene: He has if again gas that has formed within the soft tissue of the groin, scrotum and perineum on the left-hand side.  I do not palpate crepitus on exam but this is most consistent with early necrotizing fasciitis.  He was counseled on the fact that this is an extremely aggressive condition and that immediate surgical exploration is indicated.  I told him that I would be exploring the left groin, scrotum and the perineal region and depending on my intraoperative findings this may necessitate excision of nonviable tissue including skin and subcutaneous tissue in the area of the left inguinal region, left hemiscrotum and left perineum.  Because I do not see any definite necrosis of the skin yet I may be able to salvage a great deal of the skin and tissue in the area involved.  He was counseled that this may progress and further surgical debridement may be required.  I do not feel that there is any indication for conservative management in this situation.  2.  Chest pain: He was evaluated for this in the ER.  It appears to be noncardiac in origin.  Diabetes:  3.  Diabetes: I have requested consultation from the hospitalist service for management of this condition and his other  Plan: 1.  Emergent surgical exploration and debridement as described above. 2.  Hospitalist consultation for management of medical comorbidities.   HPI: Chad Rivas is a 52 year old male with diabetes who began having swelling and discomfort in the left groin region about 5 PM yesterday.  He said when he felt the area it was tender.  He then noted swelling of his left hemiscrotum.  It is somewhat uncomfortable but is not terribly painful.  It has come up very quickly and he said he was going to wait till Friday to get it checked but he came into ER with chest pain as well.  Exam in the ER revealed  induration but really no fluctuance or palpable crepitus was noted.  A CT scan was obtained to evaluate this further and reveals significant gas in the cutaneous tissue involving the left perineal region standing up into the left hemiscrotum and into the left groin region as well.  There peers to be edema versus abscess present with edema involving the scrotal wall bilaterally was no definite abscess action present.  He reports he has not had any voiding complaints and specifically no dysuria or hematuria.  He also denies a history of UTIs or other urologic difficulty.  Past Medical History:  Diagnosis Date  . Diabetes mellitus without complication [facility])     Past Surgical History:  Procedure Laterality Date  . JOINT REPLACEMENT  2009   R hip  . TOTAL HIP ARTHROPLASTY      No family history on file. Social History:  reports that he has never smoked. He has never used smokeless tobacco. He reports that he does not drink alcohol or use drugs.  Allergies:  Allergies  Allergen Reactions  . Penicillins Anaphylaxis    Medications Prior to Admission  Medication Sig Dispense Refill  . Cetirizine HCl (ZYRTEC PO) Take by mouth.    . diphenhydrAMINE (BENADRYL) 25 MG tablet Take 1 tablet (25 mg total) by mouth every 6 (six) hours as needed for itching (Rash). 30 tablet 0  . EPINEPHrine (EPIPEN 2-PAK) 0.3 mg/0.3 mL IJ SOAJ injection Inject 0.3  mLs (0.3 mg total) into the muscle once. 1 Device 2  . famotidine (PEPCID) 20 MG tablet Take 1 tablet (20 mg total) by mouth 2 (two) times daily. 30 tablet 5  . ibuprofen (ADVIL,MOTRIN) 200 MG tablet Take 800 mg by mouth every 6 (six) hours as needed (for pain.).    Marland Kitchen loperamide (IMODIUM A-D) 2 MG tablet Take 1 tablet (2 mg total) by mouth 4 (four) times daily as needed for diarrhea or loose stools. 30 tablet 0  . loratadine (CLARITIN) 10 MG tablet TAKE TWO TABLETS ONCE DAILY. 60 tablet 5  . metFORMIN (GLUCOPHAGE) 500 MG tablet Take 500 mg by mouth 2 (two)  times daily with a meal.      Results for orders placed or performed during the hospital encounter of 07/13/17 (from the past 48 hour(s))  Basic metabolic panel     Status: Abnormal   Collection Time: 07/13/17 12:58 PM  Result Value Ref Range   Sodium 129 (L) 135 - 145 mmol/L   Potassium 3.8 3.5 - 5.1 mmol/L   Chloride 92 (L) 101 - 111 mmol/L   CO2 23 22 - 32 mmol/L   Glucose, Bld 494 (H) 65 - 99 mg/dL   BUN 9 6 - 20 mg/dL   Creatinine, Ser 1.01 0.61 - 1.24 mg/dL   Calcium 8.6 (L) 8.9 - 10.3 mg/dL   GFR calc non Af Amer >60 >60 mL/min   GFR calc Af Amer >60 >60 mL/min    Comment: (NOTE) The eGFR has been calculated using the CKD EPI equation. This calculation has not been validated in all clinical situations. eGFR's persistently <60 mL/min signify possible Chronic Kidney Disease.    Anion gap 14 5 - 15    Comment: Performed at [facility], [address] [place], [place] [address]  CBC     Status: Abnormal   Collection Time: 07/13/17 12:58 PM  Result Value Ref Range   WBC 15.8 (H) 4.0 - 10.5 K/uL   RBC 5.14 4.22 - 5.81 MIL/uL   Hemoglobin 12.9 (L) 13.0 - 17.0 g/dL   HCT 38.7 (L) 39.0 - 52.0 %   MCV 75.3 (L) 78.0 - 100.0 fL   MCH 25.1 (L) 26.0 - 34.0 pg   MCHC 33.3 30.0 - 36.0 g/dL   RDW 14.3 11.5 - 15.5 %   Platelets 284 150 - 400 K/uL    Comment: Performed at [facility], [address] [place], [place] [address]  Troponin I     Status: None   Collection Time: 07/13/17 12:58 PM  Result Value Ref Range   Troponin I <0.03 <0.03 ng/mL    Comment: Performed at [facility], [address] [place], [place] [address]  D-dimer, quantitative (not at [facility])     Status: Abnormal   Collection Time: 07/13/17 12:58 PM  Result Value Ref Range   D-Dimer, Quant 1.15 (H) 0.00 - 0.50 ug/mL-FEU    Comment: (NOTE) At the manufacturer cut-off of 0.50 ug/mL FEU, this assay has been documented to exclude PE with a sensitivity and negative  predictive value of 97 to 99%.  At this time, this assay has not been approved by the FDA to exclude DVT/VTE. Results should be correlated with clinical presentation. Performed at [facility], [address] [place], [place] [address]   I-Stat CG4 Lactic Acid, ED     Status: None   Collection Time: 07/13/17  1:44 PM  Result Value Ref Range   Lactic Acid, Venous 1.72 0.5 - 1.9 mmol/L   Dg Chest 2 View  Result Date: 07/13/2017 CLINICAL DATA:  Chest heaviness and tightness with cough. EXAM: CHEST - 2 VIEW COMPARISON:  Chest x-ray dated July 03, 2017. FINDINGS: The heart size and mediastinal contours are within normal limits. Normal pulmonary vascularity. Bibasilar atelectasis. No focal consolidation, pleural effusion, or pneumothorax. No acute osseous abnormality. IMPRESSION: No active cardiopulmonary disease. Electronically Signed   By: Titus Dubin M.D.   On: 07/13/2017 12:14   Ct Angio Chest Pe W And/or Wo Contrast  Result Date: 07/13/2017 CLINICAL DATA:  Chest pain and shortness of breath, LEFT groin swelling, elevated white blood cell count, tachycardia, elevated D-dimer. EXAM: CT ANGIOGRAPHY CHEST CT ABDOMEN AND PELVIS WITH CONTRAST TECHNIQUE: Multidetector CT imaging of the chest was performed using the standard protocol during bolus administration of intravenous contrast. Multiplanar CT image reconstructions and MIPs were obtained to evaluate the vascular anatomy. Multidetector CT imaging of the abdomen and pelvis was performed using the standard protocol during bolus administration of intravenous contrast. CONTRAST:  150m ISOVUE-370 IOPAMIDOL (ISOVUE-370) INJECTION 76% COMPARISON:  CT abdomen dated 09/07/2016. FINDINGS: CTA CHEST FINDINGS Cardiovascular: Beam hardening artifact limits characterization of the most peripheral segmental and subsegmental pulmonary arteries to each lung, however, there is no obstructing pulmonary embolism identified within the main, lobar or  segmental pulmonary arteries bilaterally. No thoracic aortic aneurysm or evidence of aortic dissection. Heart size is normal. No pericardial effusion. Mediastinum/Nodes: No mass or enlarged lymph nodes within the mediastinum or perihilar regions. Esophagus appears normal. Trachea and central bronchi are unremarkable. Lungs/Pleura: Single pleural based nodule at the LEFT lung base, measuring 9 mm (series 6, image 58), not significantly changed compared to the previous chest CT of 09/07/2016. Lungs otherwise clear. No pleural effusion or pneumothorax. Musculoskeletal: No acute or suspicious osseous finding Review of the MIP images confirms the above findings. CT ABDOMEN and PELVIS FINDINGS Hepatobiliary: Liver is diffusely low in density indicating fatty infiltration. No focal mass or lesion identified in the liver. Gallbladder appears normal. No bile duct dilatation seen. Pancreas: Unremarkable. No pancreatic ductal dilatation or surrounding inflammatory changes. Spleen: Normal in size without focal abnormality. Adrenals/Urinary Tract: Adrenal glands appear normal. Kidneys appear normal without mass, stone or hydronephrosis. No perinephric inflammation. No ureteral or bladder calculi identified. Bladder is unremarkable, although portion of the bladder and of the adjacent distal RIGHT ureter are obscured by metallic artifact emanating from patient's RIGHT hip arthroplasty hardware. Stomach/Bowel: Bowel is normal in caliber. No bowel wall thickening or evidence of bowel wall inflammation seen. Appendix is normal. Stomach is unremarkable, decompressed. Vascular/Lymphatic: No significant vascular findings are present. No enlarged abdominal or pelvic lymph nodes. Presumably reactive lymph nodes in the LEFT groin. Reproductive: Extensive fluid/edema within the scrotal walls bilaterally. Extensive soft tissue gas extending from the base of the scrotum to the LEFT perineum suggesting Fournier's gangrene. No intrapelvic  extension. Other: No free fluid or abscess collection within the intraperitoneal abdomen or pelvis. No free intraperitoneal air. Musculoskeletal: No acute or suspicious osseous finding. Review of the MIP images confirms the above findings. IMPRESSION: 1. Extensive soft tissue edema/fluid throughout the scrotal walls bilaterally, with associated edema extending into the subcutaneous soft tissues of the upper LEFT thigh, with additional soft tissue gas extending from the base of the scrotum into the LEFT perineum, overall findings highly suggestive of Fournier's gangrene. No circumscribed abscess collection identified within the superficial soft tissues. No intrapelvic (  intraperitoneal) extension. 2. No acute findings within the chest. No pulmonary embolism identified, with mild study limitations detailed above. No pneumonia or pulmonary edema. 3. Pleural based nodule within the LEFT lower lobe, measuring 9 mm, not significantly changed compared to previous CT of 09/07/2016. Given the short-term stability, follow-up CT at 18-24 months (from today's scan) is considered optional for low-risk patients, but is recommended for high-risk patients. This recommendation follows the consensus statement: Guidelines for Management of Incidental Pulmonary Nodules Detected on CT Images: From the Fleischner Society 2017; Radiology 2017; 284:228-243. 4. Fatty infiltration of the liver. Electronically Signed   By: Franki Cabot M.D.   On: 07/13/2017 14:36   Ct Abdomen Pelvis W Contrast  Result Date: 07/13/2017 CLINICAL DATA:  Chest pain and shortness of breath, LEFT groin swelling, elevated white blood cell count, tachycardia, elevated D-dimer. EXAM: CT ANGIOGRAPHY CHEST CT ABDOMEN AND PELVIS WITH CONTRAST TECHNIQUE: Multidetector CT imaging of the chest was performed using the standard protocol during bolus administration of intravenous contrast. Multiplanar CT image reconstructions and MIPs were obtained to evaluate the vascular  anatomy. Multidetector CT imaging of the abdomen and pelvis was performed using the standard protocol during bolus administration of intravenous contrast. CONTRAST:  142m ISOVUE-370 IOPAMIDOL (ISOVUE-370) INJECTION 76% COMPARISON:  CT abdomen dated 09/07/2016. FINDINGS: CTA CHEST FINDINGS Cardiovascular: Beam hardening artifact limits characterization of the most peripheral segmental and subsegmental pulmonary arteries to each lung, however, there is no obstructing pulmonary embolism identified within the main, lobar or segmental pulmonary arteries bilaterally. No thoracic aortic aneurysm or evidence of aortic dissection. Heart size is normal. No pericardial effusion. Mediastinum/Nodes: No mass or enlarged lymph nodes within the mediastinum or perihilar regions. Esophagus appears normal. Trachea and central bronchi are unremarkable. Lungs/Pleura: Single pleural based nodule at the LEFT lung base, measuring 9 mm (series 6, image 58), not significantly changed compared to the previous chest CT of 09/07/2016. Lungs otherwise clear. No pleural effusion or pneumothorax. Musculoskeletal: No acute or suspicious osseous finding Review of the MIP images confirms the above findings. CT ABDOMEN and PELVIS FINDINGS Hepatobiliary: Liver is diffusely low in density indicating fatty infiltration. No focal mass or lesion identified in the liver. Gallbladder appears normal. No bile duct dilatation seen. Pancreas: Unremarkable. No pancreatic ductal dilatation or surrounding inflammatory changes. Spleen: Normal in size without focal abnormality. Adrenals/Urinary Tract: Adrenal glands appear normal. Kidneys appear normal without mass, stone or hydronephrosis. No perinephric inflammation. No ureteral or bladder calculi identified. Bladder is unremarkable, although portion of the bladder and of the adjacent distal RIGHT ureter are obscured by metallic artifact emanating from patient's RIGHT hip arthroplasty hardware. Stomach/Bowel: Bowel  is normal in caliber. No bowel wall thickening or evidence of bowel wall inflammation seen. Appendix is normal. Stomach is unremarkable, decompressed. Vascular/Lymphatic: No significant vascular findings are present. No enlarged abdominal or pelvic lymph nodes. Presumably reactive lymph nodes in the LEFT groin. Reproductive: Extensive fluid/edema within the scrotal walls bilaterally. Extensive soft tissue gas extending from the base of the scrotum to the LEFT perineum suggesting Fournier's gangrene. No intrapelvic extension. Other: No free fluid or abscess collection within the intraperitoneal abdomen or pelvis. No free intraperitoneal air. Musculoskeletal: No acute or suspicious osseous finding. Review of the MIP images confirms the above findings. IMPRESSION: 1. Extensive soft tissue edema/fluid throughout the scrotal walls bilaterally, with associated edema extending into the subcutaneous soft tissues of the upper LEFT thigh, with additional soft tissue gas extending from the base of the scrotum into the LEFT perineum,  overall findings highly suggestive of Fournier's gangrene. No circumscribed abscess collection identified within the superficial soft tissues. No intrapelvic (intraperitoneal) extension. 2. No acute findings within the chest. No pulmonary embolism identified, with mild study limitations detailed above. No pneumonia or pulmonary edema. 3. Pleural based nodule within the LEFT lower lobe, measuring 9 mm, not significantly changed compared to previous CT of 09/07/2016. Given the short-term stability, follow-up CT at 18-24 months (from today's scan) is considered optional for low-risk patients, but is recommended for high-risk patients. This recommendation follows the consensus statement: Guidelines for Management of Incidental Pulmonary Nodules Detected on CT Images: From the Fleischner Society 2017; Radiology 2017; 284:228-243. 4. Fatty infiltration of the liver. Electronically Signed   By: Franki Cabot M.D.   On: 07/13/2017 14:36    Review of Systems (10 point) - Negative except as noted above.  Blood pressure 122/76, pulse (!) 108, temperature 98.8 F (37.1 C), temperature source Oral, resp. rate (!) 24, height 5' 10"  (1.778 m), weight (!) 148.3 kg (327 lb), SpO2 93 %. General appearance: alert, cooperative, obese and no distress Neck: no adenopathy and no JVD Resp: clear to auscultation bilaterally Cardio: regular rate and rhythm GI: soft, non-tender; bowel sounds normal; no masses,  no organomegaly in the left groin I note significant induration.  It is tender.  There is some mild erythema and this extends down into the left hemiscrotum with further extension down into the perineal region all on the left-hand side.  The right testicle seems normal to palpation.  He has a normal-appearing phallus. Extremities: extremities normal, atraumatic, no cyanosis or edema Skin: Skin color, texture, turgor normal. No rashes or lesions  Neurologic: Grossly normal    Yovany Clock C 07/13/2017, 4:17 PM

## 2017-07-13 NOTE — Anesthesia Preprocedure Evaluation (Signed)
Anesthesia Evaluation  Patient identified by MRN, date of birth, ID band Patient awake    Reviewed: Allergy & Precautions, H&P , NPO status , Patient's Chart, lab work & pertinent test results  Airway Mallampati: II   Neck ROM: full    Dental   Pulmonary sleep apnea ,    breath sounds clear to auscultation       Cardiovascular negative cardio ROS   Rhythm:regular Rate:Normal     Neuro/Psych    GI/Hepatic   Endo/Other  diabetes, Poorly Controlled, Type obesity  Renal/GU      Musculoskeletal   Abdominal   Peds  Hematology   Anesthesia Other Findings   Reproductive/Obstetrics                             Anesthesia Physical Anesthesia Plan  ASA: III  Anesthesia Plan: General   Post-op Pain Management:    Induction: Intravenous  PONV Risk Score and Plan: 2 and Ondansetron and Treatment may vary due to age or medical condition  Airway Management Planned: LMA  Additional Equipment:   Intra-op Plan:   Post-operative Plan:   Informed Consent: I have reviewed the patients History and Physical, chart, labs and discussed the procedure including the risks, benefits and alternatives for the proposed anesthesia with the patient or authorized representative who has indicated his/her understanding and acceptance.     Plan Discussed with: CRNA, Anesthesiologist and Surgeon  Anesthesia Plan Comments:         Anesthesia Quick Evaluation

## 2017-07-13 NOTE — ED Triage Notes (Signed)
C/o CP started yesterday-states he was seen here for the same recently with dx GERD-NAD-steady gait

## 2017-07-13 NOTE — ED Notes (Signed)
Order for Clindamycin  received; Clindamycin  already infusing; v.o. From [place], [place] received to add  infusion to existing infusion. Hilarie Fredrickson, RN with [facility] sts she will hang additional  en route.

## 2017-07-14 ENCOUNTER — Encounter (HOSPITAL_COMMUNITY): Payer: Self-pay | Admitting: Urology

## 2017-07-14 DIAGNOSIS — L02214 Cutaneous abscess of groin: Secondary | ICD-10-CM | POA: Diagnosis not present

## 2017-07-14 DIAGNOSIS — E1165 Type 2 diabetes mellitus with hyperglycemia: Secondary | ICD-10-CM | POA: Diagnosis not present

## 2017-07-14 LAB — CBC WITH DIFFERENTIAL/PLATELET
Basophils Absolute: 0 10*3/uL (ref 0.0–0.1)
Basophils Relative: 0 %
Eosinophils Absolute: 0 10*3/uL (ref 0.0–0.7)
Eosinophils Relative: 0 %
HEMATOCRIT: 39.5 % (ref 39.0–52.0)
Hemoglobin: 12.3 g/dL — ABNORMAL LOW (ref 13.0–17.0)
LYMPHS ABS: 1.5 10*3/uL (ref 0.7–4.0)
LYMPHS PCT: 9 %
MCH: 24.3 pg — AB (ref 26.0–34.0)
MCHC: 31.1 g/dL (ref 30.0–36.0)
MCV: 78.1 fL (ref 78.0–100.0)
Monocytes Absolute: 1.2 10*3/uL — ABNORMAL HIGH (ref 0.1–1.0)
Monocytes Relative: 8 %
NEUTROS ABS: 13.1 10*3/uL — AB (ref 1.7–7.7)
NEUTROS PCT: 83 %
Platelets: 273 10*3/uL (ref 150–400)
RBC: 5.06 MIL/uL (ref 4.22–5.81)
RDW: 14.4 % (ref 11.5–15.5)
WBC: 15.9 10*3/uL — ABNORMAL HIGH (ref 4.0–10.5)

## 2017-07-14 LAB — BASIC METABOLIC PANEL
Anion gap: 10 (ref 5–15)
BUN: 7 mg/dL (ref 6–20)
CHLORIDE: 96 mmol/L — AB (ref 101–111)
CO2: 26 mmol/L (ref 22–32)
CREATININE: 0.93 mg/dL (ref 0.61–1.24)
Calcium: 8.3 mg/dL — ABNORMAL LOW (ref 8.9–10.3)
GFR calc non Af Amer: >60 mL/min (ref >60)
Glucose, Bld: 340 mg/dL — ABNORMAL HIGH (ref 65–99)
Potassium: 3.8 mmol/L (ref 3.5–5.1)
Sodium: 132 mmol/L — ABNORMAL LOW (ref 135–145)

## 2017-07-14 LAB — GLUCOSE, CAPILLARY
Glucose-Capillary: 287 mg/dL — ABNORMAL HIGH (ref 65–99)
Glucose-Capillary: 378 mg/dL — ABNORMAL HIGH (ref 65–99)
Glucose-Capillary: 323 mg/dL — ABNORMAL HIGH (ref 65–99)

## 2017-07-14 LAB — HEMOGLOBIN A1C
HEMOGLOBIN A1C: 14.2 % — AB (ref 4.8–5.6)
Mean Plasma Glucose: 360.84 mg/dL

## 2017-07-14 LAB — HIV ANTIBODY (ROUTINE TESTING W REFLEX): HIV Screen 4th Generation wRfx: NONREACTIVE

## 2017-07-14 MED ORDER — HYDROCODONE-ACETAMINOPHEN 10-325 MG PO TABS: 1.0000 | ORAL_TABLET | ORAL | 0 refills | Status: DC | PRN

## 2017-07-14 MED ORDER — INSULIN ASPART 100 UNIT/ML ~~LOC~~ SOLN: 0.0000 [IU] | Freq: Every day | SUBCUTANEOUS | Status: DC

## 2017-07-14 MED ORDER — INSULIN ASPART 100 UNIT/ML ~~LOC~~ SOLN
0.0000 [IU] | Freq: Three times a day (TID) | SUBCUTANEOUS | Status: DC
  Administered 2017-07-14: 15 [IU] via SUBCUTANEOUS

## 2017-07-14 MED ORDER — INSULIN ASPART 100 UNIT/ML ~~LOC~~ SOLN
4.0000 [IU] | Freq: Three times a day (TID) | SUBCUTANEOUS | Status: DC
  Administered 2017-07-14: 4 [IU] via SUBCUTANEOUS

## 2017-07-14 MED ORDER — SULFAMETHOXAZOLE-TRIMETHOPRIM 800-160 MG PO TABS: 1.0000 | ORAL_TABLET | Freq: Two times a day (BID) | ORAL | 0 refills | Status: DC

## 2017-07-14 NOTE — Discharge Summary (Signed)
Physician Discharge Summary      Patient ID: Chad Rivas MRN: [id number] DOB/AGE: [birth year] 52 y.o.  Admit date: 07/13/2017 Discharge date: 07/14/2017  Admission Diagnoses: Fournier's gangrene [N49.3] Hyperglycemia [R73.9] Sepsis, due to unspecified organism [facility]) [A41.9] Chest pain, unspecified type [R07.9]  Discharge Diagnoses:  Active Problems:   Abscess of groin, left   Type 2 diabetes mellitus with hyperglycemia (HCC)   Morbidly obese (HCC)   Discharged Condition: good  Hospital Course: Mr. Rollo is a 52 year old male who was emergently admitted to the hospital and taken to the operating room after having developed swelling in the left groin, scrotum and perineum approximately 24 hours previously.  He had presented to med [facility] with atypical chest pain which was evaluated and a cardiac source was ruled out.  While there he mentioned the swelling in his groin and was found on exam to be indurated without obvious fluctuance or crepitus however a CT scan was obtained which revealed extensive gas formation within the groin and lateral to the left side of the scrotum that appeared to track into the upper portion of the peritoneum.  On my exam I also found no fluctuance or crepitus.  He did not have any areas of skin necrosis however because of the fact that he was a diabetic with gas in the soft tissue we discussed the very high risk of possible necrotizing fasciitis and the need for immediate surgical exploration. He was explored and was found to have an area lateral to the scrotum on the left-hand side with grossly purulent material however there was no evidence of tissue necrosis within the abscess cavity or surrounding this.  It did seem to track down into the perineum and up into the groin somewhat.  This was thoroughly irrigated and Penrose drains were placed in the furthest extent of the groin and perineal cavity. Throughout his hospitalization from the time of  admission and overnight he has had only a minimal elevation of his white blood cell count and not developed any fever, hypotension or tachycardia.  He has remained on parenteral antibiotics and is reporting that he is feeling 100% better this morning.  His pain is minimal.  His wound appears stable with no evidence of necrosis.  He is therefore felt to be ready for discharge at this time.  I will maintain him on Bactrim and he will follow-up with me next week for a wound check.    Discharge Exam: Blood pressure 131/84, pulse 88, temperature (!) 97.5 F (36.4 C), temperature source Oral, resp. rate 14, height  (1.778 m), weight (!) 148 kg (326 lb 3.2 oz), SpO2 100 %. General: Awake, alert and in no apparent distress. Chest: Normal respiratory effort. Cardiovascular: Regular rate and rhythm. Abdomen: Soft, obese, nontender, nondistended. GU: Phallus normal, left groin with some induration but no significant erythema or tenderness.  Perineum with decreased tenderness and wound with packing in place with minimal drainage, no bleeding and viable skin edges as well as subcutaneous tissue.  Disposition: Discharge disposition: 01-Home or Self Care       Discharge Instructions    Discharge patient   Complete by:  As directed    Discharge disposition:  01-Home or Self Care   Discharge patient date:  07/14/2017     Allergies as of 07/14/2017      Reactions   Penicillins Anaphylaxis   Has patient had a PCN reaction causing immediate rash, facial/tongue/throat swelling, SOB or lightheadedness with hypotension: Y Has  patient had a PCN reaction causing severe rash involving mucus membranes or skin necrosis: Y Has patient had a PCN reaction that required hospitalization: Y Has patient had a PCN reaction occurring within the last 10 years: N If all of the above answers are "NO", then may proceed with Cephalosporin use.      Medication List    TAKE these medications   alum & mag  hydroxide-simeth 400-400-40 MG/5ML suspension Commonly known as:  MAALOX PLUS Take 15 mLs by mouth every 6 (six) hours as needed for indigestion.   diphenhydrAMINE 25 MG tablet Commonly known as:  BENADRYL Take 1 tablet (25 mg total) by mouth every 6 (six) hours as needed for itching (Rash).   EPINEPHrine 0.3 mg/0.3 mL Soaj injection Commonly known as:  EPIPEN 2-PAK Inject 0.3 mLs (0.3 mg total) into the muscle once.   famotidine 20 MG tablet Commonly known as:  PEPCID Take 1 tablet (20 mg total) by mouth 2 (two) times daily. What changed:    when to take this  reasons to take this   FERREX 150 150 MG capsule Generic drug:  iron polysaccharides Take 150 mg by mouth daily.   fluticasone 50 MCG/ACT nasal spray Commonly known as:  FLONASE Place 1 spray into both nostrils daily as needed for allergies.   HYDROcodone-acetaminophen 10-325 MG tablet Commonly known as:  NORCO Take 1-2 tablets by mouth every 4 (four) hours as needed for moderate pain. Maximum dose per 24 hours - 8 pills   ibuprofen 200 MG tablet Commonly known as:  ADVIL,MOTRIN Take 800 mg by mouth every 6 (six) hours as needed (for pain.).   loperamide 2 MG tablet Commonly known as:  IMODIUM A-D Take 1 tablet (2 mg total) by mouth 4 (four) times daily as needed for diarrhea or loose stools.   loratadine 10 MG tablet Commonly known as:  CLARITIN TAKE TWO TABLETS ONCE DAILY.   metFORMIN 500 MG tablet Commonly known as:  GLUCOPHAGE Take 500 mg by mouth 2 (two) times daily with a meal.   MULTIVITAMIN ADULT Tabs Take 1 tablet by mouth daily.   oxymetazoline 0.05 % nasal spray Commonly known as:  AFRIN Place 1 spray into both nostrils 2 (two) times daily as needed for congestion.   sulfamethoxazole-trimethoprim 800-160 MG tablet Commonly known as:  BACTRIM DS,SEPTRA DS Take 1 tablet by mouth 2 (two) times daily.      Follow-up Information    Call [facility].   Why:  For an  appointment in 4-5 days when you get home. Contact information: [address] [place] [address] [phone]          Signed: Garnett Farm 07/14/2017, 7:29 AM

## 2017-07-14 NOTE — Progress Notes (Signed)
PROGRESS NOTE    Chad Rivas  ZOX:[id number] DOB: 1965-10-04 DOA: 07/13/2017 PCP: Marden Noble, MD   Brief Narrative:  Chad Rivas is a 52 y.o. male with medical history significant of DM type II, morbid obesity, and OSA not on CPAP; who presented with complaints of pain and swelling of the left groin region since 5 PM yesterday afternoon.  Patient was seen at [facility] and noted to have significant gas in the cutaneous tissue involving the left perineal region concerning for edema versus abscess.  Patient was admitted to Dr. Vernie Ammons of the Urology service and taken for I&D.  Lab work initially revealed sodium 129 and glucose of 494 with anion gap 14.  Patient is unsure of last hemoglobin A1c and normally reported blood sugars being in the 90s on metformin. TRH was consulted for uncontrolled Blood Sugars and Hyperglycemia.  Assessment & Plan:   Active Problems:   Abscess of groin, left   Type 2 diabetes mellitus with hyperglycemia (HCC)   Morbidly obese (HCC)  Abscess of left groin /Early Fournier's gangrene:  -Patient presented with pain and swelling of the left groin area. -Imaging studies showed findings concerning for Fournier's gangrene. -Patient was taken for I&D by Urology -Per Urology  Uncontrolled Hyperglycemia and Diabetes Mellitus Type 2  -Patient's initial blood glucose was 494 on admission without signs of a elevated anion gap.   -Patient was also noted to have Pseudohyponatremia with sodium of 129.  Sodium within normal limits when adjusted for hyperglycemia.  -Suspected acute symptoms secondary to acute stress stress and underlying infection. -Started on Hypoglycemic protocols -Held Home Metformin during hospitalization but will need to continue at D/C -Checked Hemoglobin A1c and was 14.2 -Started Levemir 5 units nightly and recommending discharging patient on Long Acting Insulin and close follow up with PCP -CBGs q 4hrs with moderate SSI every 4 hours overnight changed to  Resistant Scale AC/HS along with 4 units Pre-meal Insulin -Reassess and adjust dosages as needed -Diabetes Education Coordinator Consulted and apprecaiated Recommendations -Recommend Blood Glucose Log  -Recommend close outpatient follow-up with PCP to further control Insulin   Morbid obesity: BMI equal to 46.8 -Counseled on need to weight loss -Diabetic Diet   DVT prophylaxis: Per Primary  Code Status: FULL CODE Family Communication: Discussed with Wife and Daughter at Bedside Disposition Plan: Primary Discharging patient today   Consultants:   TRH   Procedures: I and D of Groin Abscess done by Dr. Vernie Ammons   Antimicrobials:  Anti-infectives (From admission, onward)   Start     Dose/Rate Route Frequency Ordered Stop   07/14/17 0800  ciprofloxacin (CIPRO) IVPB 400 mg     400 mg 200 mL/hr over 60 Minutes Intravenous Every 12 hours 07/13/17 2115 07/14/17 0940   07/14/17 0300  vancomycin (VANCOCIN) IVPB 1000 mg/200 mL premix     1,000 mg 200 mL/hr over 60 Minutes Intravenous Every 12 hours 07/13/17 2115 07/14/17 0611   07/14/17 0000  sulfamethoxazole-trimethoprim (BACTRIM DS,SEPTRA DS) 800-160 MG tablet     1 tablet Oral 2 times daily 07/14/17 0728     07/13/17 2200  clindamycin (CLEOCIN) IVPB 900 mg     900 mg 100 mL/hr over 30 Minutes Intravenous Every 8 hours 07/13/17 2115 07/14/17 2159   07/13/17 1532  clindamycin (CLEOCIN) 300 MG/50ML IVPB    Note to Pharmacy:  Chad Rivas   : cabinet override      07/13/17 1532 07/14/17 0344   07/13/17 1517  vancomycin (VANCOCIN) 1000 MG  powder    Note to Pharmacy:  Chad Rivas   : cabinet override      07/13/17 1517 07/14/17 0329   07/13/17 1516  vancomycin (VANCOCIN) 1-5 GM/200ML-% IVPB  Status:  Discontinued    Note to Pharmacy:  Chad Rivas   : cabinet override      07/13/17 1516 07/13/17 1515   07/13/17 1515  vancomycin (VANCOCIN) 2,000 mg in sodium chloride 0.9 % 500 mL IVPB     2,000 mg 250 mL/hr over 120 Minutes  Intravenous  Once 07/13/17 1506 07/13/17 1824   07/13/17 1515  clindamycin (CLEOCIN) IVPB 900 mg     900 mg 100 mL/hr over 30 Minutes Intravenous  Once 07/13/17 1510 07/13/17 1632   07/13/17 1500  clindamycin (CLEOCIN) IVPB 600 mg     600 mg 100 mL/hr over 30 Minutes Intravenous  Once 07/13/17 1452 07/13/17 1537   07/13/17 1500  vancomycin (VANCOCIN) IVPB 1000 mg/200 mL premix  Status:  Discontinued     1,000 mg 200 mL/hr over 60 Minutes Intravenous  Once 07/13/17 1452 07/13/17 1506   07/13/17 1500  ciprofloxacin (CIPRO) IVPB 400 mg     400 mg 200 mL/hr over 60 Minutes Intravenous  Once 07/13/17 1453 07/13/17 2028   07/13/17 1500  metroNIDAZOLE (FLAGYL) IVPB 500 mg     500 mg 100 mL/hr over 60 Minutes Intravenous  Once 07/13/17 1453 07/13/17 1936     Subjective: Seen and Examined and had no pains.  Had a little tenderness in the groin area.  No nausea, vomiting or shortness of breath.  Objective: Vitals:   07/13/17 2045 07/13/17 2110 07/13/17 2113 07/14/17 0353  BP: 131/61 (!) 154/93  131/84  Pulse: (!) 113 (!) 113  88  Resp: 12 14  14   Temp: 100.1 F (37.8 C) 99.7 F (37.6 C)  (!) 97.5 F (36.4 C)  TempSrc:  Oral  Oral  SpO2: 95% 93%  100%  Weight:   (!) 148 kg (326 lb 3.2 oz)   Height:   5\' 10"  (1.778 m)     Intake/Output Summary (Last 24 hours) at 07/14/2017 1259 Last data filed at 07/14/2017 0600 Gross per 24 hour  Intake 2038.75 ml  Output -  Net 2038.75 ml   Filed Weights   07/13/17 1141 07/13/17 2113  Weight: (!) 148.3 kg (327 lb) (!) 148 kg (326 lb 3.2 oz)   Examination: Physical Exam:  Constitutional: WN/WD obese AAM, NAD and appears calm and comfortable Eyes: Lids and conjunctivae normal, sclerae anicteric  ENMT: External Ears, Nose appear normal. Grossly normal hearing. Mucous membranes are moist.  Neck: Appears normal, supple, no cervical masses, normal ROM, no appreciable thyromegaly, no JVD Respiratory: Clear to auscultation bilaterally, no wheezing,  rales, rhonchi or crackles. Normal respiratory effort and patient is not tachypenic. No accessory muscle use.  Cardiovascular: RRR, no murmurs / rubs / gallops. S1 and S2 auscultated. No extremity edema.  Abdomen: Soft, non-tender, Distended 2/2 body habitus. No masses palpated. No appreciable hepatosplenomegaly. Bowel sounds positive x4.  GU: Deferred. Musculoskeletal: No clubbing / cyanosis of digits/nails. No joint deformity upper and lower extremities.  Skin: Warm and dry on limited skin eval.  Neurologic: CN 2-12 grossly intact with no focal deficits. Romberg sign and cerebellar reflexes not assessed.  Psychiatric: Normal judgment and insight. Alert and oriented x 3. Normal mood and appropriate affect.   Data Reviewed: I have personally reviewed following labs and imaging studies  CBC: Recent Labs  Lab 07/13/17  1258 07/14/17 0530  WBC 15.8* 15.9*  NEUTROABS  --  13.1*  HGB 12.9* 12.3*  HCT 38.7* 39.5  MCV 75.3* 78.1  PLT 284 273   Basic Metabolic Panel: Recent Labs  Lab 07/13/17 1258 07/14/17 0530  NA 129* 132*  K 3.8 3.8  CL 92* 96*  CO2 23 26  GLUCOSE 494* 340*  BUN 9 7  CREATININE 1.01 0.93  CALCIUM 8.6* 8.3*   GFR: Estimated Creatinine Clearance: 140 mL/min (by C-G formula based on SCr of 0.93 mg/dL). Liver Function Tests: No results for input(s): AST, ALT, ALKPHOS, BILITOT, PROT, ALBUMIN in the last 168 hours. No results for input(s): LIPASE, AMYLASE in the last 168 hours. No results for input(s): AMMONIA in the last 168 hours. Coagulation Profile: No results for input(s): INR, PROTIME in the last 168 hours. Cardiac Enzymes: Recent Labs  Lab 07/13/17 1258  TROPONINI <0.03   BNP (last 3 results) No results for input(s): PROBNP in the last 8760 hours. HbA1C: Recent Labs    07/14/17 0530  HGBA1C 14.2*   CBG: Recent Labs  Lab 07/13/17 2121 07/13/17 2310 07/14/17 0350 07/14/17 0728 07/14/17 1143  GLUCAP 379* 397* 287* 378* 323*   Lipid  Profile: No results for input(s): CHOL, HDL, LDLCALC, TRIG, CHOLHDL, LDLDIRECT in the last 72 hours. Thyroid Function Tests: No results for input(s): TSH, T4TOTAL, FREET4, T3FREE, THYROIDAB in the last 72 hours. Anemia Panel: No results for input(s): VITAMINB12, FOLATE, FERRITIN, TIBC, IRON, RETICCTPCT in the last 72 hours. Sepsis Labs: Recent Labs  Lab 07/13/17 1344  LATICACIDVEN 1.72    Recent Results (from the past 240 hour(s))  Blood culture (routine x 2)     Status: None (Preliminary result)   Collection Time: 07/13/17  2:45 PM  Result Value Ref Range Status   Specimen Description   Final    BLOOD BLOOD RIGHT FOREARM Performed at [facility], [address] [place], [place] [address]    Special Requests   Final    BOTTLES DRAWN AEROBIC AND ANAEROBIC Blood Culture adequate volume Performed at [facility], [address] [place], [place] [address]    Culture   Final    NO GROWTH < 24 HOURS Performed at [facility], [address], [place], [place] [address]    Report Status PENDING  Incomplete  Blood culture (routine x 2)     Status: None (Preliminary result)   Collection Time: 07/13/17  3:01 PM  Result Value Ref Range Status   Specimen Description   Final    BLOOD BLOOD RIGHT HAND Performed at [facility], [address] [place], [place] [address]    Special Requests   Final    BOTTLES DRAWN AEROBIC ONLY Blood Culture results may not be optimal due to an inadequate volume of blood received in culture bottles Performed at [facility], [address] [place], [place] [address]    Culture   Final    NO GROWTH < 24 HOURS Performed at [facility], [address] [place], [place] [address]    Report Status PENDING  Incomplete  Aerobic/Anaerobic Culture (surgical/deep wound)     Status: None (Preliminary result)   Collection Time: 07/13/17  7:20 PM  Result Value Ref Range Status   Specimen Description  ABSCESS RIGHT GROIN  Final   Special Requests   Final    NONE Performed at [facility], [address] [place], [place] [address]    Gram Stain   Final    ABUNDANT WBC PRESENT, PREDOMINANTLY PMN MODERATE GRAM POSITIVE COCCI FEW GRAM NEGATIVE RODS    Culture   Final    NO GROWTH 1 DAY Performed at [facility], [address], [place], [place] [address]    Report Status PENDING  Incomplete    Radiology Studies: Dg Chest 2 View  Result Date: 07/13/2017 CLINICAL DATA:  Chest heaviness and tightness with cough. EXAM: CHEST - 2 VIEW COMPARISON:  Chest x-ray dated July 03, 2017. FINDINGS: The heart size and mediastinal contours are within normal limits. Normal pulmonary vascularity. Bibasilar atelectasis. No focal consolidation, pleural effusion, or pneumothorax. No acute osseous abnormality. IMPRESSION: No active cardiopulmonary disease. Electronically Signed   By: Obie Dredge M.D.   On: 07/13/2017 12:14   Ct Angio Chest Pe W And/or Wo Contrast  Result Date: 07/13/2017 CLINICAL DATA:  Chest pain and shortness of breath, LEFT groin swelling, elevated white blood cell count, tachycardia, elevated D-dimer. EXAM: CT ANGIOGRAPHY CHEST CT ABDOMEN AND PELVIS WITH CONTRAST TECHNIQUE: Multidetector CT imaging of the chest was performed using the standard protocol during bolus administration of intravenous contrast. Multiplanar CT image reconstructions and MIPs were obtained to evaluate the vascular anatomy. Multidetector CT imaging of the abdomen and pelvis was performed using the standard protocol during bolus administration of intravenous contrast. CONTRAST:  ISOVUE-370 IOPAMIDOL (ISOVUE-370) INJECTION 76% COMPARISON:  CT abdomen dated 09/07/2016. FINDINGS: CTA CHEST FINDINGS Cardiovascular: Beam hardening artifact limits characterization of the most peripheral segmental and subsegmental pulmonary arteries to each lung, however, there is no obstructing pulmonary  embolism identified within the main, lobar or segmental pulmonary arteries bilaterally. No thoracic aortic aneurysm or evidence of aortic dissection. Heart size is normal. No pericardial effusion. Mediastinum/Nodes: No mass or enlarged lymph nodes within the mediastinum or perihilar regions. Esophagus appears normal. Trachea and central bronchi are unremarkable. Lungs/Pleura: Single pleural based nodule at the LEFT lung base, measuring 9 mm (series 6, image 58), not significantly changed compared to the previous chest CT of 09/07/2016. Lungs otherwise clear. No pleural effusion or pneumothorax. Musculoskeletal: No acute or suspicious osseous finding Review of the MIP images confirms the above findings. CT ABDOMEN and PELVIS FINDINGS Hepatobiliary: Liver is diffusely low in density indicating fatty infiltration. No focal mass or lesion identified in the liver. Gallbladder appears normal. No bile duct dilatation seen. Pancreas: Unremarkable. No pancreatic ductal dilatation or surrounding inflammatory changes. Spleen: Normal in size without focal abnormality. Adrenals/Urinary Tract: Adrenal glands appear normal. Kidneys appear normal without mass, stone or hydronephrosis. No perinephric inflammation. No ureteral or bladder calculi identified. Bladder is unremarkable, although portion of the bladder and of the adjacent distal RIGHT ureter are obscured by metallic artifact emanating from patient's RIGHT hip arthroplasty hardware. Stomach/Bowel: Bowel is normal in caliber. No bowel wall thickening or evidence of bowel wall inflammation seen. Appendix is normal. Stomach is unremarkable, decompressed. Vascular/Lymphatic: No significant vascular findings are present. No enlarged abdominal or pelvic lymph nodes. Presumably reactive lymph nodes in the LEFT groin. Reproductive: Extensive fluid/edema within the scrotal walls bilaterally. Extensive soft tissue gas extending from the base of the scrotum to the LEFT perineum  suggesting Fournier's gangrene. No intrapelvic extension. Other: No free fluid or abscess collection within the intraperitoneal abdomen or pelvis. No free intraperitoneal air. Musculoskeletal: No acute or suspicious osseous finding. Review of the MIP images confirms the above findings. IMPRESSION: 1. Extensive soft tissue edema/fluid  throughout the scrotal walls bilaterally, with associated edema extending into the subcutaneous soft tissues of the upper LEFT thigh, with additional soft tissue gas extending from the base of the scrotum into the LEFT perineum, overall findings highly suggestive of Fournier's gangrene. No circumscribed abscess collection identified within the superficial soft tissues. No intrapelvic (intraperitoneal) extension. 2. No acute findings within the chest. No pulmonary embolism identified, with mild study limitations detailed above. No pneumonia or pulmonary edema. 3. Pleural based nodule within the LEFT lower lobe, measuring 9 mm, not significantly changed compared to previous CT of 09/07/2016. Given the short-term stability, follow-up CT at 18-24 months (from today's scan) is considered optional for low-risk patients, but is recommended for high-risk patients. This recommendation follows the consensus statement: Guidelines for Management of Incidental Pulmonary Nodules Detected on CT Images: From the Fleischner Society 2017; Radiology 2017; 284:228-243. 4. Fatty infiltration of the liver. Electronically Signed   By: Bary Richard M.D.   On: 07/13/2017 14:36   Ct Abdomen Pelvis W Contrast  Result Date: 07/13/2017 CLINICAL DATA:  Chest pain and shortness of breath, LEFT groin swelling, elevated white blood cell count, tachycardia, elevated D-dimer. EXAM: CT ANGIOGRAPHY CHEST CT ABDOMEN AND PELVIS WITH CONTRAST TECHNIQUE: Multidetector CT imaging of the chest was performed using the standard protocol during bolus administration of intravenous contrast. Multiplanar CT image reconstructions  and MIPs were obtained to evaluate the vascular anatomy. Multidetector CT imaging of the abdomen and pelvis was performed using the standard protocol during bolus administration of intravenous contrast. CONTRAST:  ISOVUE-370 IOPAMIDOL (ISOVUE-370) INJECTION 76% COMPARISON:  CT abdomen dated 09/07/2016. FINDINGS: CTA CHEST FINDINGS Cardiovascular: Beam hardening artifact limits characterization of the most peripheral segmental and subsegmental pulmonary arteries to each lung, however, there is no obstructing pulmonary embolism identified within the main, lobar or segmental pulmonary arteries bilaterally. No thoracic aortic aneurysm or evidence of aortic dissection. Heart size is normal. No pericardial effusion. Mediastinum/Nodes: No mass or enlarged lymph nodes within the mediastinum or perihilar regions. Esophagus appears normal. Trachea and central bronchi are unremarkable. Lungs/Pleura: Single pleural based nodule at the LEFT lung base, measuring 9 mm (series 6, image 58), not significantly changed compared to the previous chest CT of 09/07/2016. Lungs otherwise clear. No pleural effusion or pneumothorax. Musculoskeletal: No acute or suspicious osseous finding Review of the MIP images confirms the above findings. CT ABDOMEN and PELVIS FINDINGS Hepatobiliary: Liver is diffusely low in density indicating fatty infiltration. No focal mass or lesion identified in the liver. Gallbladder appears normal. No bile duct dilatation seen. Pancreas: Unremarkable. No pancreatic ductal dilatation or surrounding inflammatory changes. Spleen: Normal in size without focal abnormality. Adrenals/Urinary Tract: Adrenal glands appear normal. Kidneys appear normal without mass, stone or hydronephrosis. No perinephric inflammation. No ureteral or bladder calculi identified. Bladder is unremarkable, although portion of the bladder and of the adjacent distal RIGHT ureter are obscured by metallic artifact emanating from patient's RIGHT  hip arthroplasty hardware. Stomach/Bowel: Bowel is normal in caliber. No bowel wall thickening or evidence of bowel wall inflammation seen. Appendix is normal. Stomach is unremarkable, decompressed. Vascular/Lymphatic: No significant vascular findings are present. No enlarged abdominal or pelvic lymph nodes. Presumably reactive lymph nodes in the LEFT groin. Reproductive: Extensive fluid/edema within the scrotal walls bilaterally. Extensive soft tissue gas extending from the base of the scrotum to the LEFT perineum suggesting Fournier's gangrene. No intrapelvic extension. Other: No free fluid or abscess collection within the intraperitoneal abdomen or pelvis. No free intraperitoneal air. Musculoskeletal: No acute  or suspicious osseous finding. Review of the MIP images confirms the above findings. IMPRESSION: 1. Extensive soft tissue edema/fluid throughout the scrotal walls bilaterally, with associated edema extending into the subcutaneous soft tissues of the upper LEFT thigh, with additional soft tissue gas extending from the base of the scrotum into the LEFT perineum, overall findings highly suggestive of Fournier's gangrene. No circumscribed abscess collection identified within the superficial soft tissues. No intrapelvic (intraperitoneal) extension. 2. No acute findings within the chest. No pulmonary embolism identified, with mild study limitations detailed above. No pneumonia or pulmonary edema. 3. Pleural based nodule within the LEFT lower lobe, measuring 9 mm, not significantly changed compared to previous CT of 09/07/2016. Given the short-term stability, follow-up CT at 18-24 months (from today's scan) is considered optional for low-risk patients, but is recommended for high-risk patients. This recommendation follows the consensus statement: Guidelines for Management of Incidental Pulmonary Nodules Detected on CT Images: From the Fleischner Society 2017; Radiology 2017; 284:228-243. 4. Fatty infiltration of  the liver. Electronically Signed   By: Bary Richard M.D.   On: 07/13/2017 14:36   Scheduled Meds: . famotidine  20 mg Oral BID  . insulin aspart  0-20 Units Subcutaneous TID WC  . insulin aspart  0-5 Units Subcutaneous QHS  . insulin aspart  4 Units Subcutaneous TID WC  . insulin detemir  5 Units Subcutaneous QHS   Continuous Infusions: . sodium chloride 75 mL/hr at 07/13/17 2313  . clindamycin (CLEOCIN) IV Stopped (07/14/17 1610)    LOS: 1 day   Merlene Laughter, DO [facility] Pager [phone]  If 7PM-7AM, please contact night-coverage [email or web address] Password [email or web address] 07/14/2017, 12:59 PM

## 2017-07-14 NOTE — Progress Notes (Signed)
Inpatient Diabetes Program Recommendations  AACE/ADA: New Consensus Statement on Inpatient Glycemic Control (2015)  Target Ranges:  Prepandial:   less than 140 mg/dL      Peak postprandial:   less than 180 mg/dL (1-2 hours)      Critically ill patients:  140 - 180 mg/dL   Lab Results  Component Value Date   GLUCAP 323 (H) 07/14/2017   HGBA1C 14.2 (H) 07/14/2017    Spoke with patient about diabetes and home regimen for diabetes control. Patient reports that he is followed by hid PCP Dr. Kevan Ny for diabetes management and last saw him a couple of months ago. Patient reports that he thinks his A1c was around 9 at that time. Patient reports no medication changes at that time. Patient report being on Metformin for 2 years.  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  Patient drinks water and not sport drinks as hydration.  Patient checks his glucose once a day in the mornings. Discussed A1C results 14.2% this admission. Discussed glucose and A1C goals. Discussed importance of checking CBGs and maintaining good CBG control to prevent long-term and short-term complications. Explained how hyperglycemia leads to damage within blood vessels which lead to the common complications seen with uncontrolled diabetes. Stressed to the patient the importance of improving glycemic control to prevent further complications from uncontrolled diabetes. Discussed impact of nutrition, exercise, stress, sickness, and medications on diabetes control.   Discussed carbohydrates, carbohydrate goals per day and meal, along with portion sizes. Encouraged patient to check glucose 4 times per day (before meals and at bedtime) and to keep a log book of glucose readings and insulin taken which will need to be taken to doctor appointments.   Patient verbalized understanding of information discussed and he states  that he has no further questions at this time related to diabetes.  Patient to follow up with PCP.  Thanks,  Christena Deem RN, MSN, [facility] Inpatient Diabetes Coordinator Team Pager [phone] (8a-5p)

## 2017-07-14 NOTE — Progress Notes (Signed)
Phone call received from representative Sofie Hartigan of [employer]. Representative made aware that permission to discuss medical care would need to be obtained from the patient. Pt is agreeable for RN to discuss medical care. Pt stated that his son was planning to come home if approved.

## 2017-07-14 NOTE — Discharge Instructions (Addendum)
Your Diabetes A1c level is 14.2%. Goal is to be at 7% or lower, which is an average glucose of 150 when you check your fingerstick glucose. You may need to be placed on insulin. [please follow up with your PCP.   Type 2 Diabetes Mellitus, Self Care, Adult When you have type 2 diabetes (type 2 diabetes mellitus), you must keep your blood sugar (glucose) under control. You can do this with:  Nutrition.  Exercise.  Lifestyle changes.  Medicines or insulin, if needed.  Support from your doctors and others.  How do I manage my blood sugar?    Check your blood sugar level every day, as often as told.  Call your doctor if your blood sugar is above your goal numbers for 2 tests in a row.  Have your A1c (hemoglobin A1c) level checked at least two times a year. Have it checked more often if your doctor tells you to. Your doctor will set treatment goals for you. Generally, you should have these blood sugar levels:  Before meals (preprandial): 80-130 mg/dL (4.4-7.2 mmol/L).  After meals (postprandial): lower than 180 mg/dL (10 mmol/L).  A1c level: less than 7%.  What do I need to know about high blood sugar? High blood sugar is called hyperglycemia. Know the signs of high blood sugar. Signs may include:  Feeling: ? Thirsty. ? Hungry. ? Very tired.  Needing to pee (urinate) more than usual.  Blurry vision.  What do I need to know about low blood sugar? Low blood sugar is called hypoglycemia. This is when blood sugar is at or below 70 mg/dL (3.9 mmol/L). Symptoms may include:  Feeling: ? Hungry. ? Worried or nervous (anxious). ? Sweaty and clammy. ? Confused. ? Dizzy. ? Sleepy. ? Sick to your stomach (nauseous).  Having: ? A fast heartbeat (palpitations). ? A headache. ? A change in your vision. ? Jerky movements that you cannot control (seizure). ? Nightmares. ? Tingling or no feeling (numbness) around the mouth, lips, or tongue.  Having trouble  with: ? Talking. ? Paying attention (concentrating). ? Moving (coordination). ? Sleeping.  Shaking.  Passing out (fainting).  Getting upset easily (irritability).  Treating low blood sugar   To treat low blood sugar, eat or drink something sugary right away. If you can think clearly and swallow safely, follow the 15:15 rule:  Take 15 grams of a fast-acting carb (carbohydrate). Some fast-acting carbs are: ? 1 tube of glucose gel. ? 3 sugar tablets (glucose pills). ? 6-8 pieces of hard candy. ? 4 oz (120 mL) of fruit juice. ? 4 oz (120 mL) regular (not diet) soda.  Check your blood sugar 15 minutes after you take the carb.  If your blood sugar is still at or below 70 mg/dL (3.9 mmol/L), take 15 grams of a carb again.  If your blood sugar does not go above 70 mg/dL (3.9 mmol/L) after 3 tries, get help right away.  After your blood sugar goes back to normal, eat a meal or a snack within 1 hour.  Treating very low blood sugar If your blood sugar is at or below 54 mg/dL (3 mmol/L), you have very low blood sugar (severe hypoglycemia). This is an emergency. Do not wait to see if the symptoms will go away. Get medical help right away. Call your local emergency services (911 in the [place] Do not drive yourself to the hospital. If you have very low blood sugar and you cannot eat or drink, you may need a glucagon  shot (injection). A family member or friend should learn how to check your blood sugar and how to give you a glucagon shot. Ask your doctor if you need to have a glucagon shot kit at home. What else is important to manage my diabetes? Medicine Follow these instructions about insulin and diabetes medicines:  Take them as told by your doctor.  Adjust them as told by your doctor.  Do not run out of them.  Having diabetes can raise your risk for other long-term conditions. These include heart or kidney disease. Your doctor may prescribe medicines to help prevent problems  from diabetes. Food    Make healthy food choices. These include: ? Chicken, fish, egg whites, and beans. ? Oats, whole wheat, bulgur, brown rice, quinoa, and millet. ? Fresh fruits and vegetables. ? Low-fat dairy products. ? Nuts, avocado, olive oil, and canola oil.  Make a food plan with a specialist (dietitian).  Follow instructions from your doctor about what you cannot eat or drink.  Drink enough fluid to keep your pee (urine) clear or pale yellow.  Eat healthy snacks between healthy meals.  Keep track of carbs that you eat. Read food labels. Learn food serving sizes.  Follow your sick day plan when you cannot eat or drink normally. Make this plan with your doctor so it is ready to use. Activity  Exercise at least 3 times a week.  Do not go more than 2 days without exercising.  Talk with your doctor before you start a new exercise. Your doctor may need to adjust your insulin, medicines, or food. Lifestyle    Do not use any tobacco products. These include cigarettes, chewing tobacco, and e-cigarettes.If you need help quitting, ask your doctor.  Ask your doctor how much alcohol is safe for you.  Learn to deal with stress. If you need help with this, ask your doctor. Body care  Stay up to date with your shots (immunizations).  Have your eyes and feet checked by a doctor as often as told.  Check your skin and feet every day. Check for cuts, bruises, redness, blisters, or sores.  Brush your teeth and gums two times a day.  Floss at least one time a day.  Go to the dentist least one time every 6 months.  Stay at a healthy weight. General instructions    Take over-the-counter and prescription medicines only as told by your doctor.  Share your diabetes care plan with: ? Your work or school. ? People you live with.  Check your pee (urine) for ketones: ? When you are sick. ? As told by your doctor.  Carry a card or wear jewelry that says that you have  diabetes.  Ask your doctor: ? Do I need to meet with a diabetes educator? ? Where can I find a support group for people with diabetes?  Keep all follow-up visits as told by your doctor. This is important. Where to find more information: To learn more about diabetes, visit:  American Diabetes Association: www.diabetes.org  American Association of Diabetes Educators: www.diabeteseducator.org/patient-resources  This information is not intended to replace advice given to you by your health care provider. Make sure you discuss any questions you have with your health care provider. Document Released: 06/15/2015 Document Revised: 07/30/2015 Document Reviewed: 03/27/2015 Elsevier Interactive Patient Education  [employer].

## 2017-07-17 DIAGNOSIS — Z7984 Long term (current) use of oral hypoglycemic drugs: Secondary | ICD-10-CM | POA: Diagnosis not present

## 2017-07-17 DIAGNOSIS — E119 Type 2 diabetes mellitus without complications: Secondary | ICD-10-CM | POA: Insufficient documentation

## 2017-07-17 DIAGNOSIS — Z79899 Other long term (current) drug therapy: Secondary | ICD-10-CM | POA: Diagnosis not present

## 2017-07-17 DIAGNOSIS — L02214 Cutaneous abscess of groin: Secondary | ICD-10-CM | POA: Insufficient documentation

## 2017-07-17 DIAGNOSIS — Z5189 Encounter for other specified aftercare: Secondary | ICD-10-CM | POA: Diagnosis not present

## 2017-07-17 DIAGNOSIS — Z96641 Presence of right artificial hip joint: Secondary | ICD-10-CM | POA: Diagnosis not present

## 2017-07-17 DIAGNOSIS — Z4801 Encounter for change or removal of surgical wound dressing: Secondary | ICD-10-CM | POA: Diagnosis present

## 2017-07-17 NOTE — Anesthesia Postprocedure Evaluation (Signed)
Anesthesia Post Note  Patient: Chad Rivas  Procedure(s) Performed: INCISION AND DRAINAGE OF GROIN ABSCESS (Right )     Patient location during evaluation: PACU Anesthesia Type: General Level of consciousness: awake and alert Pain management: pain level controlled Vital Signs Assessment: post-procedure vital signs reviewed and stable Respiratory status: spontaneous breathing, nonlabored ventilation, respiratory function stable and patient connected to nasal cannula oxygen Cardiovascular status: blood pressure returned to baseline and stable Postop Assessment: no apparent nausea or vomiting Anesthetic complications: no    Last Vitals:  Vitals:   07/13/17 2110 07/14/17 0353  BP: (!) 154/93 131/84  Pulse: (!) 113 88  Resp: 14 14  Temp: 37.6 C (!) 36.4 C  SpO2: 93% 100%    Last Pain:  Vitals:   07/14/17 0815  TempSrc:   PainSc: Asleep   Pain Goal: Patients Stated Pain Goal: 1 (07/14/17 0550)               Cyree Chuong S

## 2017-07-18 ENCOUNTER — Other Ambulatory Visit: Payer: Self-pay

## 2017-07-18 ENCOUNTER — Encounter (HOSPITAL_COMMUNITY): Payer: Self-pay | Admitting: Emergency Medicine

## 2017-07-18 ENCOUNTER — Emergency Department (HOSPITAL_COMMUNITY)
Admission: EM | Admit: 2017-07-18 | Discharge: 2017-07-18 | Disposition: A | Discharge status: Home / Self Care | Payer: 59 | Attending: Emergency Medicine | Admitting: Emergency Medicine

## 2017-07-18 DIAGNOSIS — Z5189 Encounter for other specified aftercare: Secondary | ICD-10-CM

## 2017-07-18 LAB — CULTURE, BLOOD (ROUTINE X 2)
CULTURE: NO GROWTH
Culture: NO GROWTH
SPECIAL REQUESTS: ADEQUATE

## 2017-07-18 NOTE — ED Provider Notes (Signed)
[facility]-EMERGENCY DEPT Provider Note   CSN: [id number] Arrival date & time: 07/17/17  2319     History   Chief Complaint Chief Complaint  Patient presents with  . Post-op Problem    HPI Chad Rivas is a 52 y.o. male.  HPI  52 year old diabetic male comes in with chief complaint of wound recheck.  Patient is status post urologic procedure for Fournier's gangrene.  It appears that patient was discharged with a Penrose drain in place, and patient states that last night the drain came off of the wound.  The wound site is continued to drain and patient has no fevers, nausea, vomiting or chills.  Patient is supposed to see urologist on Thursday.  Past Medical History:  Diagnosis Date  . Diabetes mellitus without complication (HCC)   . Sleep apnea    pt has CPAP but does not use     Patient Active Problem List   Diagnosis Date Noted  . Abscess of groin, left 07/13/2017  . Type 2 diabetes mellitus with hyperglycemia (HCC) 07/13/2017  . Morbidly obese (HCC) 07/13/2017    Past Surgical History:  Procedure Laterality Date  . IRRIGATION AND DEBRIDEMENT ABSCESS Right 07/13/2017   Procedure: INCISION AND DRAINAGE OF GROIN ABSCESS;  Surgeon: Ihor Gully, MD;  Location: [facility] ORS;  Service: Urology;  Laterality: Right;  . JOINT REPLACEMENT  2009   R hip  . TOE SURGERY    . TOTAL HIP ARTHROPLASTY          Home Medications    Prior to Admission medications   Medication Sig Start Date End Date Taking? Authorizing Provider  alum & mag hydroxide-simeth (MAALOX PLUS) 400-400-40 MG/5ML suspension Take 15 mLs by mouth every 6 (six) hours as needed for indigestion.   Yes [provider]  FERREX 150 150 MG capsule Take 150 mg by mouth daily.  06/27/17  Yes [provider]  fluticasone (FLONASE) 50 MCG/ACT nasal spray Place 1 spray into both nostrils daily as needed for allergies.  06/06/17  Yes [provider]  HYDROcodone-acetaminophen  (NORCO) 10-325 MG tablet Take 1-2 tablets by mouth every 4 (four) hours as needed for moderate pain. Maximum dose per 24 hours - 8 pills 07/14/17  Yes Ihor Gully, MD  loperamide (IMODIUM A-D) 2 MG tablet Take 1 tablet (2 mg total) by mouth 4 (four) times daily as needed for diarrhea or loose stools. 09/07/16  Yes Vanetta Mulders, MD  loratadine (CLARITIN) 10 MG tablet TAKE TWO TABLETS ONCE DAILY. Patient taking differently: Take 10 mg by mouth 2 (two) times daily as needed for allergies.  12/10/14  Yes Kozlow, Alvira Philips, MD  metFORMIN (GLUCOPHAGE) 500 MG tablet Take 500 mg by mouth 2 (two) times daily with a meal.   Yes [provider]  Multiple Vitamins-Minerals (MULTIVITAMIN ADULT) TABS Take 1 tablet by mouth daily.   Yes [provider]  oxymetazoline (AFRIN) 0.05 % nasal spray Place 1 spray into both nostrils 2 (two) times daily as needed for congestion.   Yes [provider]  sulfamethoxazole-trimethoprim (BACTRIM DS,SEPTRA DS) 800-160 MG tablet Take 1 tablet by mouth 2 (two) times daily. 07/14/17  Yes Ihor Gully, MD  diphenhydrAMINE (BENADRYL) 25 MG tablet Take 1 tablet (25 mg total) by mouth every 6 (six) hours as needed for itching (Rash). 10/03/14   Roxy Horseman, PA-C  EPINEPHrine (EPIPEN 2-PAK) 0.3 mg/0.3 mL IJ SOAJ injection Inject 0.3 mLs (0.3 mg total) into the muscle once. 12/10/14  Kozlow, Alvira Philips, MD  famotidine (PEPCID) 20 MG tablet Take 1 tablet (20 mg total) by mouth 2 (two) times daily. Patient taking differently: Take 20 mg by mouth daily as needed for heartburn or indigestion.  05/12/15   Kozlow, Alvira Philips, MD  ibuprofen (ADVIL,MOTRIN) 200 MG tablet Take 800 mg by mouth every 6 (six) hours as needed (for pain.).    [provider]    Family History History reviewed. No pertinent family history.  Social History Social History   Tobacco Use  . Smoking status: Never Smoker  . Smokeless tobacco: Never Used  Substance Use Topics  . Alcohol  use: No  . Drug use: No     Allergies   Penicillins   Review of Systems Review of Systems  Constitutional: Negative for fever.  Gastrointestinal: Negative for nausea and vomiting.  Skin: Positive for wound.  Allergic/Immunologic: Negative for immunocompromised state.     Physical Exam Updated Vital Signs BP 125/79 (BP Location: Left Arm)   Pulse 100   Temp 99.3 F (37.4 C) (Oral)   Resp 18   SpO2 92%   Physical Exam  Constitutional: He is oriented to person, place, and time. He appears well-developed.  HENT:  Head: Atraumatic.  Neck: Neck supple.  Cardiovascular: Normal rate.  Pulmonary/Chest: Effort normal.  Genitourinary:  Genitourinary Comments: Patient has a large surgical wound at the base of his scrotum. There is a Penrose drain in place which appears to have been dislodged.  Neurological: He is alert and oriented to person, place, and time.  Skin: Skin is warm.  Nursing note and vitals reviewed.    ED Treatments / Results  Labs (all labs ordered are listed, but only abnormal results are displayed) Labs Reviewed - No data to display  EKG None  Radiology No results found.  Procedures .Marland KitchenIncision and Drainage Date/Time: 07/18/2017 8:42 AM Performed by: Derwood Kaplan, MD Authorized by: Derwood Kaplan, MD   Consent:    Consent obtained:  Verbal   Consent given by:  Patient Universal protocol:    Procedure explained and questions answered to patient or proxy's satisfaction: yes     Site/side marked: yes     Immediately prior to procedure a time out was called: yes   Location:    Type:  Abscess   Location:  Anogenital   Anogenital location:  Scrotal wall Pre-procedure details:    Skin preparation:  Betadine Anesthesia (see MAR for exact dosages):    Anesthesia method:  None Procedure type:    Complexity:  Simple Procedure details:    Wound management:  Irrigated with saline   Drainage:  Purulent   Wound treatment:  Drain placed    Packing materials:  1/4 in gauze Post-procedure details:    Patient tolerance of procedure:  Tolerated well, no immediate complications   (including critical care time)  Medications Ordered in ED Medications - No data to display   Initial Impression / Assessment and Plan / ED Course  I have reviewed the triage vital signs and the nursing notes.  Pertinent labs & imaging results that were available during my care of the patient were reviewed by me and considered in my medical decision making (see chart for details).     52 year old status post surgical management of Fournier's gangrene comes in with chief complaint of wound recheck.  Patient's Penrose drain has fallen out.  I spoke with the urologist and they recommend that we remove the Penrose and packed the wound and  have patient follow-up in the clinic in 2 days.  Procedure was completed without any complications.   Final Clinical Impressions(s) / ED Diagnoses   Final diagnoses:  Wound check, abscess    ED Discharge Orders    None       Derwood Kaplan, MD 07/18/17 [phone]

## 2017-07-18 NOTE — Discharge Instructions (Addendum)
We saw in the ER after your drain had fallen out. The urologist recommended that we remove the existing drainage and packed the wound. They would like you to be seen in the clinic in 2 days. Continue to take good care of your wound site return to the ER immediately if you start developing fevers or severe pain.

## 2017-07-18 NOTE — ED Notes (Signed)
ED Provider at bedside. NANAVATI 

## 2017-07-18 NOTE — ED Triage Notes (Signed)
Pt states he had a I&D of an abscess to his left groin on 5/9 and the drain that was placed came out tonight

## 2017-07-20 LAB — AEROBIC/ANAEROBIC CULTURE W GRAM STAIN (SURGICAL/DEEP WOUND)

## 2019-05-04 ENCOUNTER — Ambulatory Visit: Payer: 59 | Attending: Internal Medicine

## 2019-05-04 DIAGNOSIS — Z23 Encounter for immunization: Secondary | ICD-10-CM | POA: Insufficient documentation

## 2019-05-04 NOTE — Progress Notes (Signed)
   Covid-19 Vaccination Clinic  Name:  Chad Rivas    MRN: [id number] DOB: August 21, 1965  05/04/2019  Chad Rivas was observed post Covid-19 immunization for 15 minutes without incidence. He was provided with Vaccine Information Sheet and instruction to access the V-Safe system.   Chad Rivas was instructed to call 911 with any severe reactions post vaccine: Marland Kitchen Difficulty breathing  . Swelling of your face and throat  . A fast heartbeat  . A bad rash all over your body  . Dizziness and weakness    Immunizations Administered    Name Date Dose VIS Date Route   Pfizer COVID-19 Vaccine 05/04/2019  6:31 PM 0.3 mL 02/15/2019 Intramuscular   Manufacturer: [employer], [employer]   Lot: [id number]   NDC: [address]-2

## 2019-05-11 ENCOUNTER — Emergency Department (HOSPITAL_BASED_OUTPATIENT_CLINIC_OR_DEPARTMENT_OTHER): Payer: 59

## 2019-05-11 ENCOUNTER — Encounter (HOSPITAL_BASED_OUTPATIENT_CLINIC_OR_DEPARTMENT_OTHER): Payer: Self-pay

## 2019-05-11 ENCOUNTER — Emergency Department (HOSPITAL_BASED_OUTPATIENT_CLINIC_OR_DEPARTMENT_OTHER)
Admission: EM | Admit: 2019-05-11 | Discharge: 2019-05-11 | Disposition: A | Discharge status: Home / Self Care | Payer: 59 | Attending: Emergency Medicine | Admitting: Emergency Medicine

## 2019-05-11 ENCOUNTER — Other Ambulatory Visit: Payer: Self-pay

## 2019-05-11 DIAGNOSIS — Z880 Allergy status to penicillin: Secondary | ICD-10-CM | POA: Diagnosis not present

## 2019-05-11 DIAGNOSIS — R1010 Upper abdominal pain, unspecified: Secondary | ICD-10-CM

## 2019-05-11 DIAGNOSIS — Z79899 Other long term (current) drug therapy: Secondary | ICD-10-CM | POA: Insufficient documentation

## 2019-05-11 DIAGNOSIS — K5289 Other specified noninfective gastroenteritis and colitis: Secondary | ICD-10-CM | POA: Diagnosis not present

## 2019-05-11 DIAGNOSIS — K529 Noninfective gastroenteritis and colitis, unspecified: Secondary | ICD-10-CM

## 2019-05-11 DIAGNOSIS — E119 Type 2 diabetes mellitus without complications: Secondary | ICD-10-CM | POA: Insufficient documentation

## 2019-05-11 DIAGNOSIS — Z7984 Long term (current) use of oral hypoglycemic drugs: Secondary | ICD-10-CM | POA: Diagnosis not present

## 2019-05-11 LAB — CBC WITH DIFFERENTIAL/PLATELET
Abs Immature Granulocytes: 0.02 10*3/uL (ref 0.00–0.07)
Basophils Absolute: 0.1 10*3/uL (ref 0.0–0.1)
Basophils Relative: 1 %
Eosinophils Absolute: 0.3 10*3/uL (ref 0.0–0.5)
Eosinophils Relative: 3 %
HCT: 44.2 % (ref 39.0–52.0)
Hemoglobin: 13.2 g/dL (ref 13.0–17.0)
Immature Granulocytes: 0 %
Lymphocytes Relative: 29 %
Lymphs Abs: 2.8 10*3/uL (ref 0.7–4.0)
MCH: 23.5 pg — ABNORMAL LOW (ref 26.0–34.0)
MCHC: 29.9 g/dL — ABNORMAL LOW (ref 30.0–36.0)
MCV: 78.8 fL — ABNORMAL LOW (ref 80.0–100.0)
Monocytes Absolute: 0.6 10*3/uL (ref 0.1–1.0)
Monocytes Relative: 6 %
Neutro Abs: 6 10*3/uL (ref 1.7–7.7)
Neutrophils Relative %: 61 %
Platelets: 358 10*3/uL (ref 150–400)
RBC: 5.61 MIL/uL (ref 4.22–5.81)
RDW: 15.1 % (ref 11.5–15.5)
WBC: 9.7 10*3/uL (ref 4.0–10.5)
nRBC: 0 % (ref 0.0–0.2)

## 2019-05-11 LAB — COMPREHENSIVE METABOLIC PANEL
ALT: 49 U/L — ABNORMAL HIGH (ref 0–44)
AST: 37 U/L (ref 15–41)
Albumin: 3.5 g/dL (ref 3.5–5.0)
Alkaline Phosphatase: 66 U/L (ref 38–126)
Anion gap: 9 (ref 5–15)
BUN: 13 mg/dL (ref 6–20)
CO2: 28 mmol/L (ref 22–32)
Calcium: 8.7 mg/dL — ABNORMAL LOW (ref 8.9–10.3)
Chloride: 98 mmol/L (ref 98–111)
Creatinine, Ser: 0.97 mg/dL (ref 0.61–1.24)
GFR calc Af Amer: >60 mL/min (ref >60)
GFR calc non Af Amer: >60 mL/min (ref >60)
Glucose, Bld: 186 mg/dL — ABNORMAL HIGH (ref 70–99)
Potassium: 3.6 mmol/L (ref 3.5–5.1)
Sodium: 135 mmol/L (ref 135–145)
Total Bilirubin: 0.8 mg/dL (ref 0.3–1.2)
Total Protein: 8.1 g/dL (ref 6.5–8.1)

## 2019-05-11 LAB — URINALYSIS, ROUTINE W REFLEX MICROSCOPIC
Bilirubin Urine: NEGATIVE
Glucose, UA: 100 mg/dL — AB
Hgb urine dipstick: NEGATIVE
Ketones, ur: NEGATIVE mg/dL
Leukocytes,Ua: NEGATIVE
Nitrite: NEGATIVE
Protein, ur: 30 mg/dL — AB
Specific Gravity, Urine: >1.03 — ABNORMAL HIGH (ref 1.005–1.030)
pH: 6 (ref 5.0–8.0)

## 2019-05-11 LAB — CBG MONITORING, ED: Glucose-Capillary: 178 mg/dL — ABNORMAL HIGH (ref 70–99)

## 2019-05-11 LAB — URINALYSIS, MICROSCOPIC (REFLEX)

## 2019-05-11 LAB — LIPASE, BLOOD: Lipase: 77 U/L — ABNORMAL HIGH (ref 11–51)

## 2019-05-11 MED ORDER — METRONIDAZOLE 500 MG PO TABS: 500.0000 mg | ORAL_TABLET | Freq: Three times a day (TID) | ORAL | 0 refills | Status: DC

## 2019-05-11 MED ORDER — METRONIDAZOLE 500 MG PO TABS
500.0000 mg | ORAL_TABLET | Freq: Once | ORAL | Status: AC
  Administered 2019-05-11: 500 mg via ORAL
  Filled 2019-05-11: qty 1

## 2019-05-11 MED ORDER — IOHEXOL 300 MG/ML  SOLN
100.0000 mL | Freq: Once | INTRAMUSCULAR | Status: AC | PRN
  Administered 2019-05-11: 100 mL via INTRAVENOUS

## 2019-05-11 MED ORDER — CIPROFLOXACIN HCL 500 MG PO TABS: 500.0000 mg | ORAL_TABLET | Freq: Two times a day (BID) | ORAL | 0 refills | Status: DC

## 2019-05-11 MED ORDER — CIPROFLOXACIN HCL 500 MG PO TABS
500.0000 mg | ORAL_TABLET | Freq: Once | ORAL | Status: AC
  Administered 2019-05-11: 500 mg via ORAL
  Filled 2019-05-11: qty 1

## 2019-05-11 MED ORDER — ONDANSETRON 4 MG PO TBDP: 4.0000 mg | ORAL_TABLET | Freq: Three times a day (TID) | ORAL | 0 refills | Status: DC | PRN

## 2019-05-11 NOTE — ED Provider Notes (Signed)
[facility] EMERGENCY DEPARTMENT Provider Note   CSN: [id number] Arrival date & time: 05/11/19  1612     History Chief Complaint  Patient presents with  . Abdominal Pain    Chad Rivas is a 54 y.o. male.  Patient presents to the emergency department with complaint of upper abdominal pain over the past 1 and half weeks.  He states that he feels full after eating a small amount and has had nausea with occasional vomiting.  Occasional diarrhea as well.  He reports constipation has not passed gas recently.  No fevers.  No chest pain or shortness of breath.  Patient has a history of diabetes but has not been taking his diabetes medications because of stomach discomfort.  Patient has a history of Fournier's gangrene 2 years ago.  No urinary symptoms.  No history of abdominal surgeries.        Past Medical History:  Diagnosis Date  . Diabetes mellitus without complication (HCC)   . Sleep apnea    pt has CPAP but does not use     Patient Active Problem List   Diagnosis Date Noted  . Abscess of groin, left 07/13/2017  . Type 2 diabetes mellitus with hyperglycemia (HCC) 07/13/2017  . Morbidly obese (HCC) 07/13/2017    Past Surgical History:  Procedure Laterality Date  . IRRIGATION AND DEBRIDEMENT ABSCESS Right 07/13/2017   Procedure: INCISION AND DRAINAGE OF GROIN ABSCESS;  Surgeon: Ihor Gully, MD;  Location: [facility];  Service: Urology;  Laterality: Right;  . JOINT REPLACEMENT  2009   R hip  . TOE SURGERY    . TOTAL HIP ARTHROPLASTY         No family history on file.  Social History   Tobacco Use  . Smoking status: Never Smoker  . Smokeless tobacco: Never Used  Substance Use Topics  . Alcohol use: No  . Drug use: No    Home Medications Prior to Admission medications   Medication Sig Start Date End Date Taking? Authorizing Provider  alum & mag hydroxide-simeth (MAALOX PLUS) 400-400-40 MG/5ML suspension Take 15 mLs by mouth every 6 (six) hours as needed  for indigestion.    [provider]  diphenhydrAMINE (BENADRYL) 25 MG tablet Take 1 tablet (25 mg total) by mouth every 6 (six) hours as needed for itching (Rash). 10/03/14   Roxy Horseman, PA-C  EPINEPHrine (EPIPEN 2-PAK) 0.3 mg/0.3 mL IJ SOAJ injection Inject 0.3 mLs (0.3 mg total) into the muscle once. Patient taking differently: Inject 0.3 mg into the muscle once as needed (For anaphylaxis.).  12/10/14   Kozlow, Alvira Philips, MD  FERREX 150 150 MG capsule Take 150 mg by mouth daily.  06/27/17   [provider]  fluticasone (FLONASE) 50 MCG/ACT nasal spray Place 1 spray into both nostrils daily as needed for allergies.  06/06/17   [provider]  glimepiride (AMARYL) 2 MG tablet Take 4 mg by mouth 2 (two) times daily. 04/29/19   [provider]  HYDROcodone-acetaminophen (NORCO) 10-325 MG tablet Take 1-2 tablets by mouth every 4 (four) hours as needed for moderate pain. Maximum dose per 24 hours - 8 pills 07/14/17   Ihor Gully, MD  ibuprofen (ADVIL,MOTRIN) 200 MG tablet Take 800 mg by mouth every 6 (six) hours as needed (for pain.).    [provider]  loperamide (IMODIUM A-D) 2 MG tablet Take 1 tablet (2 mg total) by mouth 4 (four) times daily as needed for diarrhea or loose stools. 09/07/16  Vanetta Mulders, MD  loratadine (CLARITIN) 10 MG tablet TAKE TWO TABLETS ONCE DAILY. Patient taking differently: Take 10 mg by mouth 2 (two) times daily as needed for allergies.  12/10/14   Kozlow, Alvira Philips, MD  metFORMIN (GLUCOPHAGE) 500 MG tablet Take 500 mg by mouth 2 (two) times daily with a meal.    [provider]  Multiple Vitamins-Minerals (MULTIVITAMIN ADULT) TABS Take 1 tablet by mouth daily.    [provider]  oxymetazoline (AFRIN) 0.05 % nasal spray Place 1 spray into both nostrils 2 (two) times daily as needed for congestion.    [provider]  sulfamethoxazole-trimethoprim (BACTRIM DS,SEPTRA DS) 800-160 MG tablet Take 1 tablet by  mouth 2 (two) times daily. 07/14/17   Ihor Gully, MD    Allergies    Penicillins  Review of Systems   Review of Systems  Constitutional: Negative for fever.  HENT: Negative for rhinorrhea and sore throat.   Eyes: Negative for redness.  Respiratory: Negative for cough.   Cardiovascular: Negative for chest pain.  Gastrointestinal: Positive for abdominal distention, abdominal pain, constipation, diarrhea, nausea and vomiting. Negative for blood in stool.  Genitourinary: Negative for dysuria.  Musculoskeletal: Negative for myalgias.  Skin: Negative for rash.  Neurological: Negative for headaches.    Physical Exam Updated Vital Signs BP (!) 160/102 (BP Location: Right Wrist)   Pulse 89   Temp 97.6 F (36.4 C) (Oral)   Resp 18   Ht 5\' 11"  (1.803 m)   Wt (!) 158.8 kg   SpO2 95%   BMI 48.82 kg/m   Physical Exam Vitals and nursing note reviewed.  Constitutional:      Appearance: He is well-developed.  HENT:     Head: Normocephalic and atraumatic.  Eyes:     General:        Right eye: No discharge.        Left eye: No discharge.     Conjunctiva/sclera: Conjunctivae normal.  Cardiovascular:     Rate and Rhythm: Normal rate and regular rhythm.     Heart sounds: Normal heart sounds.  Pulmonary:     Effort: Pulmonary effort is normal.     Breath sounds: Normal breath sounds.  Abdominal:     Palpations: Abdomen is soft.     Tenderness: There is abdominal tenderness (Mild, nonfocal, upper abdominal tenderness to palpation) in the right upper quadrant, epigastric area, periumbilical area and left upper quadrant. There is no guarding or rebound.  Musculoskeletal:     Cervical back: Normal range of motion and neck supple.  Skin:    General: Skin is warm and dry.  Neurological:     Mental Status: He is alert.     ED Results / Procedures / Treatments   Labs (all labs ordered are listed, but only abnormal results are displayed) Labs Reviewed  CBC WITH  DIFFERENTIAL/PLATELET - Abnormal; Notable for the following components:      Result Value   MCV 78.8 (*)    MCH 23.5 (*)    MCHC 29.9 (*)    All other components within normal limits  COMPREHENSIVE METABOLIC PANEL - Abnormal; Notable for the following components:   Glucose, Bld 186 (*)    Calcium 8.7 (*)    ALT 49 (*)    All other components within normal limits  LIPASE, BLOOD - Abnormal; Notable for the following components:   Lipase 77 (*)    All other components within normal limits  URINALYSIS, ROUTINE W REFLEX MICROSCOPIC -  Abnormal; Notable for the following components:   Specific Gravity, Urine >1.030 (*)    Glucose, UA 100 (*)    Protein, ur 30 (*)    All other components within normal limits  URINALYSIS, MICROSCOPIC (REFLEX) - Abnormal; Notable for the following components:   Bacteria, UA RARE (*)    All other components within normal limits  CBG MONITORING, ED - Abnormal; Notable for the following components:   Glucose-Capillary 178 (*)    All other components within normal limits    EKG None  Radiology CT ABDOMEN PELVIS W CONTRAST  Result Date: 05/11/2019 CLINICAL DATA:  Abdominal pain and bloating for 2 weeks. Nausea and vomiting. Diarrhea. EXAM: CT ABDOMEN AND PELVIS WITH CONTRAST TECHNIQUE: Multidetector CT imaging of the abdomen and pelvis was performed using the standard protocol following bolus administration of intravenous contrast. CONTRAST:  OMNIPAQUE IOHEXOL 300 MG/ML  SOLN COMPARISON:  07/13/2017 FINDINGS: Lower Chest: No acute findings. Hepatobiliary: Moderate to severe diffuse hepatic steatosis again demonstrated. No hepatic masses identified. Gallbladder is unremarkable. No evidence of biliary ductal dilatation. Pancreas:  No mass or inflammatory changes. Spleen: Within normal limits in size and appearance. Adrenals/Urinary Tract: No masses identified. No evidence of hydronephrosis. Urinary bladder is obscured by severe beam hardening artifact from right  hip prosthesis. Stomach/Bowel: Moderate wall thickening is seen involving a mid small bowel loop in the right abdomen, with adjacent soft tissue stranding in the small bowel mesentery. Sparing of the proximal and distal small bowel is seen. No evidence of bowel obstruction, pneumatosis, free intraperitoneal air, or abscess. Normal appendix visualized. Vascular/Lymphatic: No pathologically enlarged lymph nodes. No abdominal aortic aneurysm. Reproductive:  No mass or other significant abnormality. Other:  None. Musculoskeletal:  No suspicious bone lesions identified. IMPRESSION: 1. Moderate wall thickening involving a mid small bowel loop, consistent with enteritis. No evidence of abscess, bowel obstruction, or other complication. 2. Moderate to severe hepatic steatosis. Electronically Signed   By: Danae Orleans M.D.   On: 05/11/2019 17:59    Procedures Procedures (including critical care time)  Medications Ordered in ED Medications  iohexol (OMNIPAQUE) 300 MG/ML solution 100 mL (100 mLs Intravenous Contrast Given 05/11/19 1741)  ciprofloxacin (CIPRO) tablet 500 mg (500 mg Oral Given 05/11/19 1839)  metroNIDAZOLE (FLAGYL) tablet 500 mg (500 mg Oral Given 05/11/19 1839)    ED Course  I have reviewed the triage vital signs and the nursing notes.  Pertinent labs & imaging results that were available during my care of the patient were reviewed by me and considered in my medical decision making (see chart for details).  Patient seen and examined. Labs ordered, may need CT.   Vital signs reviewed and are as follows: BP 138/85 (BP Location: Right Arm)   Pulse 77   Temp 97.6 F (36.4 C) (Oral)   Resp 16   Ht 5\' 11"  (1.803 m)   Wt (!) 158.8 kg   SpO2 96%   BMI 48.82 kg/m   CT ordered. Pt updated.   CT personally reviewed.  CT shows evidence of enteritis.  No signs of obstruction or more serious infection.  Patient appears comfortable, exam unchanged.  Given duration of symptoms, now approaching 2  weeks, will give course of antibiotics.  Patient started on Cipro and Flagyl.  Counseled on use of probiotics.  Patient has a penicillin allergy.  Discussed that when he restarts his glipizide, the Cipro may cause his blood sugars to be low and he needs to monitor this closely.  He states that he has upcoming PCP follow-up.  Encouraged him to follow-up to be rechecked to make sure symptoms are improving.  6:46 PM The patient was urged to return to the Emergency Department immediately with worsening of current symptoms, worsening abdominal pain, persistent vomiting, blood noted in stools, fever, or any other concerns. The patient verbalized understanding.       MDM Rules/Calculators/A&P                      Patient with abdominal pain, appetite change, nausea, vomiting, diarrhea. Vitals are stable, no fever. Labs with slightly elevated lipase, however symptoms are not consistent with pancreatitis.  Normal white blood cell count. Imaging CT demonstrates thickened loop of small bowel consistent with enteritis. No signs of dehydration, patient is tolerating PO's. Lungs are clear and no signs suggestive of PNA. Low concern for appendicitis, cholecystitis, pancreatitis, ruptured viscus, UTI, kidney stone, aortic dissection, aortic aneurysm or other emergent abdominal etiology.  Patient looks well.  Supportive therapy indicated with return if symptoms worsen.   Final Clinical Impression(s) / ED Diagnoses Final diagnoses:  Enteritis  Pain of upper abdomen    Rx / DC Orders ED Discharge Orders    None       Carlisle Cater, PA-C 05/11/19 1847    Fredia Sorrow, MD 05/12/19 [phone]

## 2019-05-11 NOTE — ED Triage Notes (Signed)
Pt arrives ambulatory to ED with c/o epigastric pain X2 weeks. Pt reports only being able to eat a bland diet. Pt has vomited 2X with some diarrhea. Pt reports he feels "like it is just built up".

## 2019-05-11 NOTE — Discharge Instructions (Signed)
Please read and follow all provided instructions.  Your diagnoses today include:  1. Enteritis   2. Pain of upper abdomen     Tests performed today include:  Blood counts and electrolytes - normal infection cells  Blood tests to check liver and kidney function  Blood tests to check pancreas function - very slightly high  Urine test to look for infection  CT scan - shows an inflamed loop of small intestine  Vital signs. See below for your results today.   Medications prescribed:   Ciprofloxacin - antibiotic  You have been prescribed an antibiotic medicine: take the entire course of medicine even if you are feeling better. Stopping early can cause the antibiotic not to work.   Metronidazole - antibiotic  You have been prescribed an antibiotic medicine: take the entire course of medicine even if you are feeling better. Stopping early can cause the antibiotic not to work. Do not drink alcohol when taking this medication.    Zofran (ondansetron) - for nausea and vomiting  Take any prescribed medications only as directed.  Home care instructions:   Follow any educational materials contained in this packet.  Follow-up instructions: Please follow-up with your primary care provider in the next 3 days for further evaluation of your symptoms.    Return instructions:  SEEK IMMEDIATE MEDICAL ATTENTION IF:  The pain does not go away or becomes severe   A temperature above 101F develops   Repeated vomiting occurs (multiple episodes)   The pain becomes localized to portions of the abdomen. The right side could possibly be appendicitis. In an adult, the left lower portion of the abdomen could be colitis or diverticulitis.   Blood is being passed in stools or vomit (bright red or black tarry stools)   You develop chest pain, difficulty breathing, dizziness or fainting, or become confused, poorly responsive, or inconsolable (young children)  If you have any other emergent  concerns regarding your health  Additional Information: Abdominal (belly) pain can be caused by many things. Your caregiver performed an examination and possibly ordered blood/urine tests and imaging (CT scan, x-rays, ultrasound). Many cases can be observed and treated at home after initial evaluation in the emergency department. Even though you are being discharged home, abdominal pain can be unpredictable. Therefore, you need a repeated exam if your pain does not resolve, returns, or worsens. Most patients with abdominal pain don't have to be admitted to the hospital or have surgery, but serious problems like appendicitis and gallbladder attacks can start out as nonspecific pain. Many abdominal conditions cannot be diagnosed in one visit, so follow-up evaluations are very important.  Your vital signs today were: BP (!) 160/102 (BP Location: Right Wrist)   Pulse 89   Temp 97.6 F (36.4 C) (Oral)   Resp 18   Ht 5\' 11"  (1.803 m)   Wt (!) 158.8 kg   SpO2 95%   BMI 48.82 kg/m  If your blood pressure (bp) was elevated above 135/85 this visit, please have this repeated by your doctor within one month. --------------

## 2019-05-11 NOTE — ED Triage Notes (Addendum)
Pt also reports he has been unable to take his diabetes medication as well. CBG in triage 178

## 2019-05-20 IMAGING — CT CT ABD-PELV W/ CM
3 of 15 series · 11 of 46 positions shown, 16 images · IV contrast (iopamidol)
Comparison: CT abdomen dated 09/07/2016.

CLINICAL DATA: Chest pain and shortness of breath, LEFT groin
swelling, elevated white blood cell count, tachycardia, elevated
D-dimer.

EXAM:
CT ANGIOGRAPHY CHEST
CT ABDOMEN AND PELVIS WITH CONTRAST
TECHNIQUE: Multidetector CT imaging of the chest was performed using the
standard protocol during bolus administration of intravenous
contrast. Multiplanar CT image reconstructions and MIPs were
obtained to evaluate the vascular anatomy. Multidetector CT imaging
of the abdomen and pelvis was performed using the standard protocol
during bolus administration of intravenous contrast.
CONTRAST:  100mL PAQDM8-TSW IOPAMIDOL (PAQDM8-TSW) INJECTION 76%

[Series 7: pe thins · axial · 0.85mm/px · z∈[+942,+1156]mm · 7 of 311 slices shown]
[im 24/311  soft-tissue]
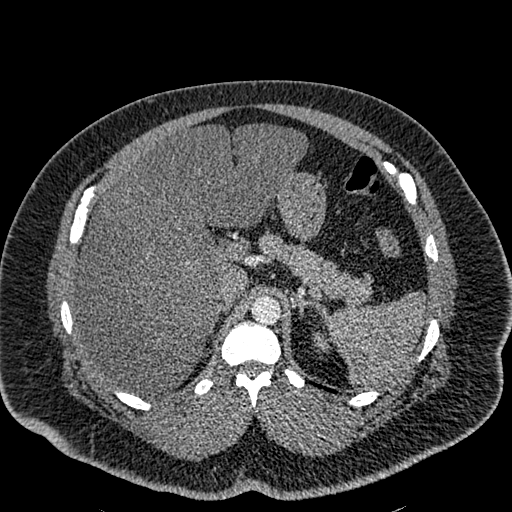
[im 72/311  soft-tissue]
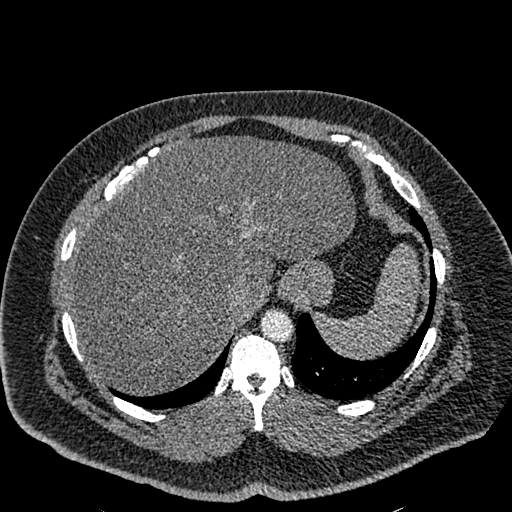
[im 96/311  soft-tissue]
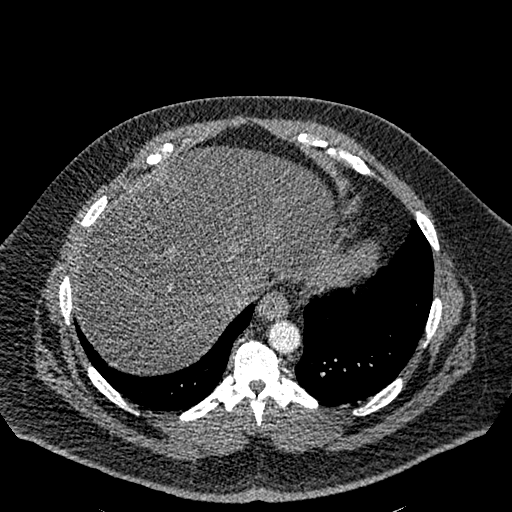
[im 144/311  soft-tissue]
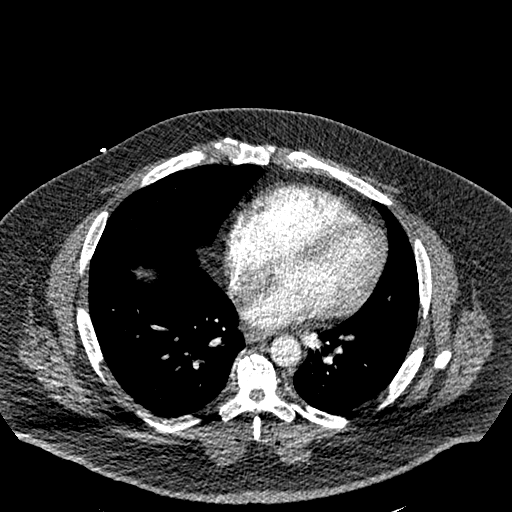
[im 167/311  soft-tissue]
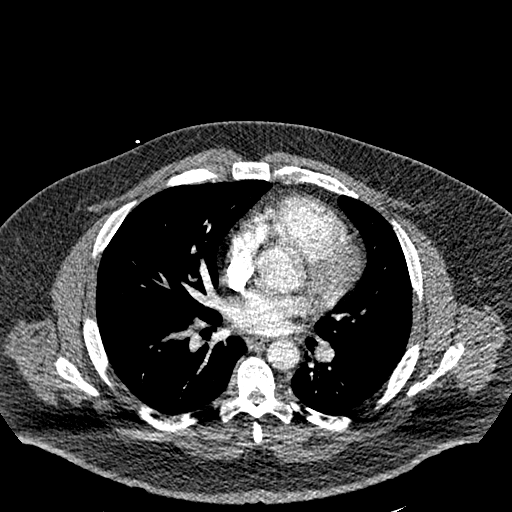
[im 215/311  soft-tissue]
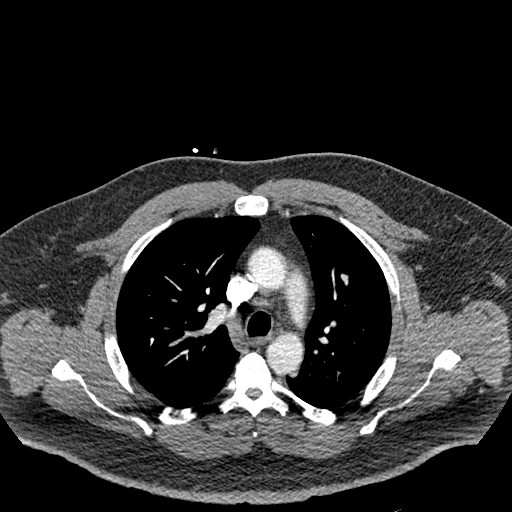
[im 239/311  soft-tissue]
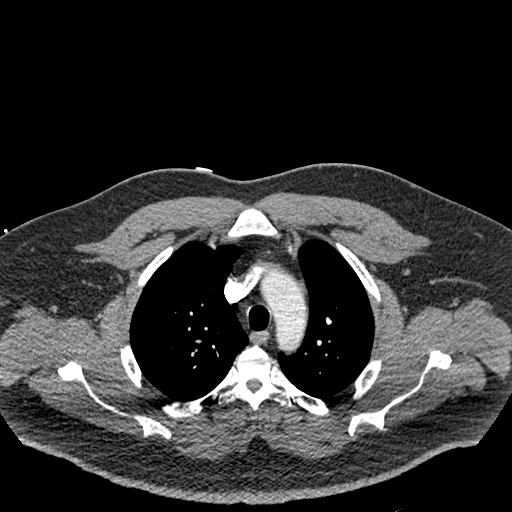

[Series 8: pe coronal mpr · coronal · 0.62mm/px · 1 of 157 slices shown, 2 images]
[im 79/157  soft-tissue]
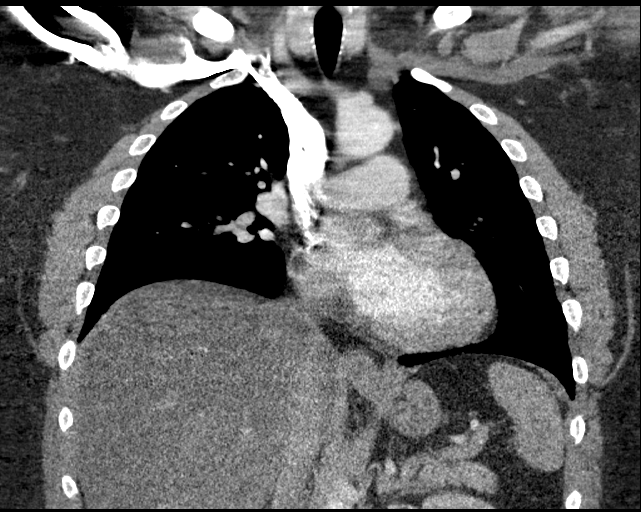
[im 79/157  bone]
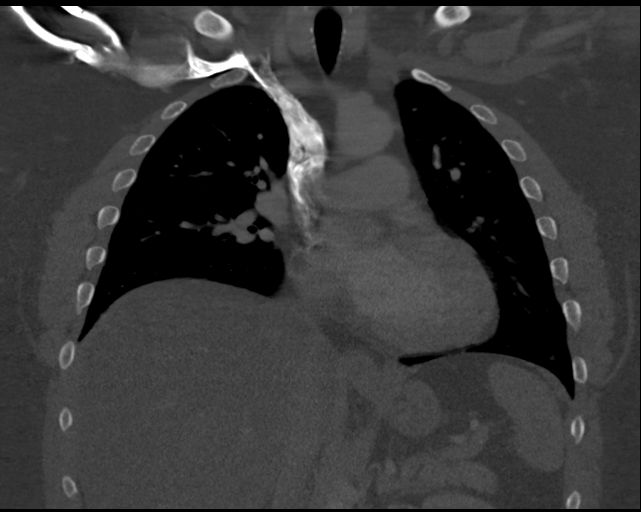

[Series 12: axial st · axial · 0.98mm/px · z∈[+620,+920]mm · 3 of 120 slices shown, 7 images]
[im 30/120  soft-tissue]
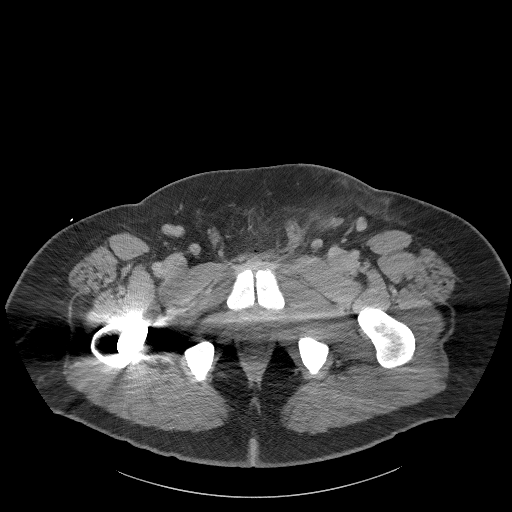
[im 30/120  lung]
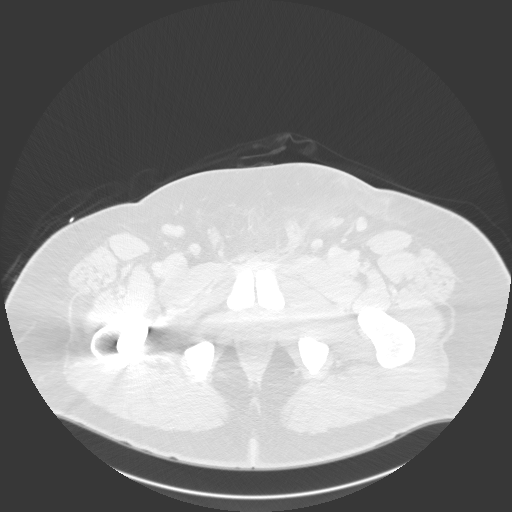
[im 30/120  bone]
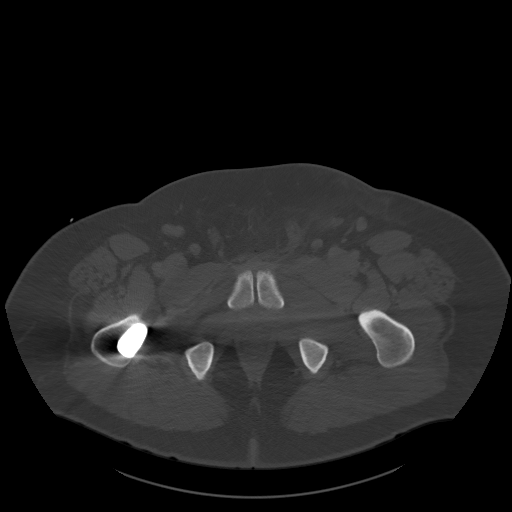
[im 60/120  soft-tissue]
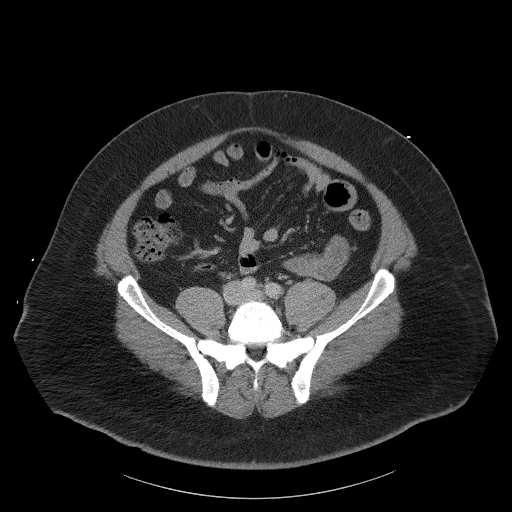
[im 60/120  lung]
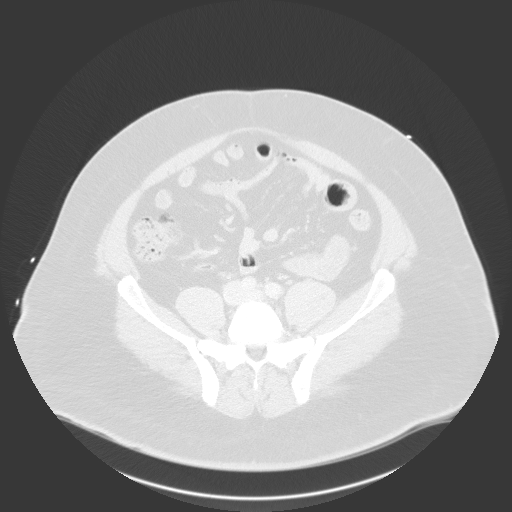
[im 90/120  soft-tissue]
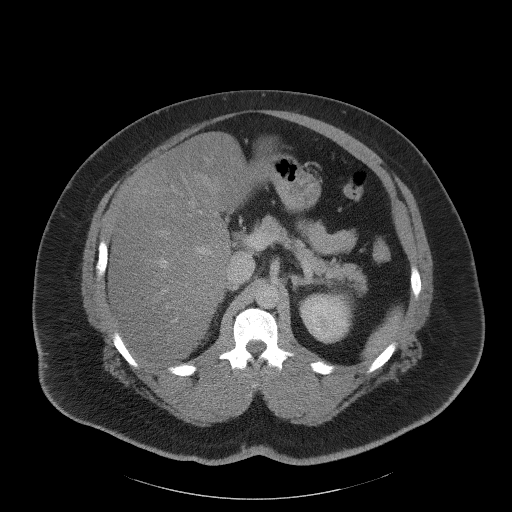
[im 90/120  lung]
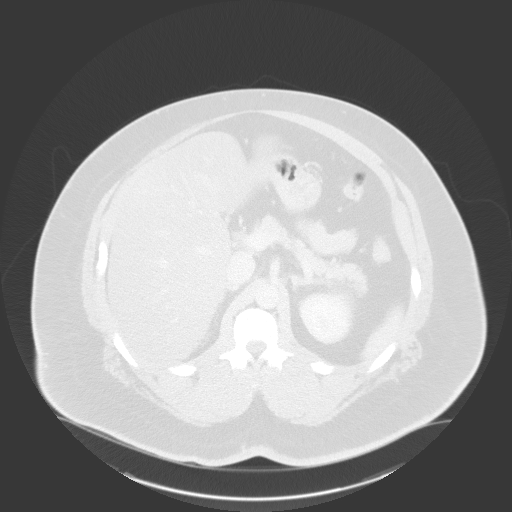

[11 of 46 positions shown; findings below may reference images not displayed]

FINDINGS: CTA CHEST FINDINGS

Cardiovascular: Beam hardening artifact limits characterization of
the most peripheral segmental and subsegmental pulmonary arteries to
each lung, however, there is no obstructing pulmonary embolism
identified within the main, lobar or segmental pulmonary arteries
bilaterally.

No thoracic aortic aneurysm or evidence of aortic dissection. Heart
size is normal. No pericardial effusion.

Mediastinum/Nodes: No mass or enlarged lymph nodes within the
mediastinum or perihilar regions. Esophagus appears normal. Trachea
and central bronchi are unremarkable.

Lungs/Pleura: Single pleural based nodule at the LEFT lung base,
measuring 9 mm (series 6, image 58), not significantly changed
compared to the previous chest CT of 09/07/2016. Lungs otherwise
clear. No pleural effusion or pneumothorax.

Musculoskeletal: No acute or suspicious osseous finding

Review of the MIP images confirms the above findings.

CT ABDOMEN and PELVIS FINDINGS

Hepatobiliary: Liver is diffusely low in density indicating fatty
infiltration. No focal mass or lesion identified in the liver.
Gallbladder appears normal. No bile duct dilatation seen.

Pancreas: Unremarkable. No pancreatic ductal dilatation or
surrounding inflammatory changes.

Spleen: Normal in size without focal abnormality.

Adrenals/Urinary Tract: Adrenal glands appear normal. Kidneys appear
normal without mass, stone or hydronephrosis. No perinephric
inflammation. No ureteral or bladder calculi identified. Bladder is
unremarkable, although portion of the bladder and of the adjacent
distal RIGHT ureter are obscured by metallic artifact emanating from
patient's RIGHT hip arthroplasty hardware.

Stomach/Bowel: Bowel is normal in caliber. No bowel wall thickening
or evidence of bowel wall inflammation seen. Appendix is normal.
Stomach is unremarkable, decompressed.

Vascular/Lymphatic: No significant vascular findings are present. No
enlarged abdominal or pelvic lymph nodes. Presumably reactive lymph
nodes in the LEFT groin.

Reproductive: Extensive fluid/edema within the scrotal walls
bilaterally. Extensive soft tissue gas extending from the base of
the scrotum to the LEFT perineum suggesting Moriya gangrene. No
intrapelvic extension.

Other: No free fluid or abscess collection within the
intraperitoneal abdomen or pelvis. No free intraperitoneal air.

Musculoskeletal: No acute or suspicious osseous finding.

Review of the MIP images confirms the above findings.
IMPRESSION: 1. Extensive soft tissue edema/fluid throughout the scrotal walls
bilaterally, with associated edema extending into the subcutaneous
soft tissues of the upper LEFT thigh, with additional soft tissue
gas extending from the base of the scrotum into the LEFT perineum,
overall findings highly suggestive of Moriya gangrene. No
circumscribed abscess collection identified within the superficial
soft tissues. No intrapelvic (intraperitoneal) extension.
2. No acute findings within the chest. No pulmonary embolism
identified, with mild study limitations detailed above. No pneumonia
or pulmonary edema.
3. Pleural based nodule within the LEFT lower lobe, measuring 9 mm,
not significantly changed compared to previous CT of 09/07/2016.
Given the short-term stability, follow-up CT at 18-24 months (from
today's scan) is considered optional for low-risk patients, but is
recommended for high-risk patients. This recommendation follows the
consensus statement: Guidelines for Management of Incidental
Pulmonary Nodules Detected on CT Images: From the [HOSPITAL]
4. Fatty infiltration of the liver.

## 2019-05-25 ENCOUNTER — Ambulatory Visit: Payer: 59 | Attending: Internal Medicine

## 2019-05-25 DIAGNOSIS — Z23 Encounter for immunization: Secondary | ICD-10-CM

## 2019-05-25 NOTE — Progress Notes (Signed)
   Covid-19 Vaccination Clinic  Name:  Chad Rivas    MRN: [id number] DOB: [birth year]  05/25/2019  [name] was observed post Covid-19 immunization for 15 minutes without incident. He was provided with Vaccine Information Sheet and instruction to access the V-Safe system.   [name] was instructed to call 911 with any severe reactions post vaccine: Marland Kitchen Difficulty breathing  . Swelling of face and throat  . A fast heartbeat  . A bad rash all over body  . Dizziness and weakness   Immunizations Administered    Name Date Dose VIS Date Route   Pfizer COVID-19 Vaccine 05/25/2019  9:26 AM 0.3 mL 02/15/2019 Intramuscular   Manufacturer: [employer], [employer]   Lot: [id number]   NDC: [address]-6

## 2019-06-05 ENCOUNTER — Other Ambulatory Visit: Payer: Self-pay

## 2019-06-05 ENCOUNTER — Encounter: Payer: Self-pay | Admitting: Dietician

## 2019-06-05 ENCOUNTER — Encounter: Payer: 59 | Attending: Internal Medicine | Admitting: Dietician

## 2019-06-05 DIAGNOSIS — E1165 Type 2 diabetes mellitus with hyperglycemia: Secondary | ICD-10-CM | POA: Diagnosis not present

## 2019-06-05 NOTE — Progress Notes (Signed)
Diabetes Self-Management Education  Visit Type: First/Initial  06/05/2019  [name], identified by name and date of birth, is a 54 y.o. male with a diagnosis of Diabetes: Type 2.   ASSESSMENT  Weight (!) 347 lb 11.2 oz (157.7 kg). Body mass index is 48.49 kg/m.   Patient states he checks his blood sugar daily, fasting. Average readings are 160-168. States that, prior to having his A1c checked 2 months ago, he would eat "just about anything." Now, states he is avoiding bread and "white" foods as much as possible. Enjoys vegetables, and has been purchasing frozen vegetables to steam them. Will typically bake meats vs. fry them. Tries to avoid snacking, formerly would snack on things such as cookies.   Diabetes Self-Management Education - 06/05/19 0743      Visit Information   Visit Type  First/Initial      Initial Visit   Diabetes Type  Type 2    Are you currently following a meal plan?  No    Are you taking your medications as prescribed?  Yes    Date Diagnosed  3 years ago (2018)      Health Coping   How would you rate your overall health?  Fair      Psychosocial Assessment   Patient Belief/Attitude about Diabetes  Motivated to manage diabetes    Self-care barriers  None    Self-management support  Doctor's office;Family    Other persons present  Patient    Patient Concerns  Nutrition/Meal planning    Special Needs  None    Preferred Learning Style  No preference indicated    Learning Readiness  Ready    How often do you need to have someone help you when you read instructions, pamphlets, or other written materials from your doctor or pharmacy?  1 - Never    What is the last grade level you completed in school?  4 years college      Complications   Last HgB A1C per patient/outside source  10.7 %   03/21/2019   How often do you check your blood sugar?  1-2 times/day    Fasting Blood glucose range (mg/dL)  130-179    Have you had a dilated eye exam in the past 12  months?  Yes    Have you had a dental exam in the past 12 months?  No    Are you checking your feet?  Yes    How many days per week are you checking your feet?  7      Dietary Intake   Breakfast  scrambled egg + grilled chicken + grits + cheese    Lunch  broccoli & cheese soup    Snack (afternoon)  popcorn    Dinner  baked chicken + mashed potatoes + broccoli   or meatloaf + sweet potato + green beans   Beverage(s)  water, rarely lemonade or soda      Exercise   Exercise Type  ADL's;Light (walking / raking leaves)    How many days per week to you exercise?  0    How many minutes per day do you exercise?  0    Total minutes per week of exercise  0      Patient Education   Previous Diabetes Education  Yes (please comment)    Disease state   Definition of diabetes, type 1 and 2, and the diagnosis of diabetes;Explored patient's options for treatment of their diabetes;Factors that contribute to  the development of diabetes    Nutrition management   Role of diet in the treatment of diabetes and the relationship between the three main macronutrients and blood glucose level;Food label reading, portion sizes and measuring food.;Carbohydrate counting;Reviewed blood glucose goals for pre and post meals and how to evaluate the patients' food intake on their blood glucose level.;Meal options for control of blood glucose level and chronic complications.    Physical activity and exercise   Role of exercise on diabetes management, blood pressure control and cardiac health.    Monitoring  Purpose and frequency of SMBG.;Taught/discussed recording of test results and interpretation of SMBG.;Interpreting lab values - A1C, lipid, urine microalbumina.;Identified appropriate SMBG and/or A1C goals.;Daily foot exams;Yearly dilated eye exam    Acute complications  Discussed and identified patients' treatment of hyperglycemia.;Taught treatment of hypoglycemia - the 15 rule.    Chronic complications  Relationship  between chronic complications and blood glucose control;Assessed and discussed foot care and prevention of foot problems      Individualized Goals (developed by patient)   Nutrition  Follow meal plan discussed    Medications  take my medication as prescribed    Monitoring   test my blood glucose as discussed      Outcomes   Expected Outcomes  Demonstrated interest in learning. Expect positive outcomes    Future DMSE  2 months       Individualized Plan for Diabetes Self-Management Training:  Learning Objective:  Patient will have a greater understanding of diabetes self-management. Patient education plan is to attend individual and/or group sessions per assessed needs and concerns.   Plan:  Patient Instructions   Aim for 2-4 servings of carbohydrates per meal.   Aim for 0-2 servings of carbohydrates per snack.   Always include a source of protein at meals/snacks.    Expected Outcomes:  Demonstrated interest in learning. Expect positive outcomes  Education material provided: ADA - How to Thrive: A Guide for Your Journey with Diabetes, A1C conversion sheet, Meal plan card, My Plate, Snack sheet and Carbohydrate counting sheet, Meal Ideas sheet  If problems or questions, patient to contact team via:  Phone and Email  Future DSME appointment: 2 months

## 2019-06-05 NOTE — Patient Instructions (Signed)
   Aim for 2-4 servings of carbohydrates per meal.   Aim for 0-2 servings of carbohydrates per snack.   Always include a source of protein at meals/snacks.

## 2019-07-30 ENCOUNTER — Ambulatory Visit: Payer: 59 | Admitting: Dietician

## 2020-03-30 ENCOUNTER — Encounter (HOSPITAL_BASED_OUTPATIENT_CLINIC_OR_DEPARTMENT_OTHER): Payer: Self-pay | Admitting: *Deleted

## 2020-03-30 ENCOUNTER — Emergency Department (HOSPITAL_BASED_OUTPATIENT_CLINIC_OR_DEPARTMENT_OTHER)
Admission: EM | Admit: 2020-03-30 | Discharge: 2020-03-30 | Disposition: A | Discharge status: Home / Self Care | Payer: 59 | Attending: Emergency Medicine | Admitting: Emergency Medicine

## 2020-03-30 ENCOUNTER — Other Ambulatory Visit: Payer: Self-pay

## 2020-03-30 DIAGNOSIS — L02214 Cutaneous abscess of groin: Secondary | ICD-10-CM | POA: Diagnosis present

## 2020-03-30 DIAGNOSIS — Z5321 Procedure and treatment not carried out due to patient leaving prior to being seen by health care provider: Secondary | ICD-10-CM | POA: Diagnosis not present

## 2020-03-30 NOTE — ED Triage Notes (Signed)
Abscess to his right groin. States his MD started him on antibiotics 3 days ago and the abscess opened and drained this am.

## 2020-03-30 NOTE — ED Notes (Signed)
Pt called from lobby with no answer. 

## 2020-08-05 DIAGNOSIS — I82402 Acute embolism and thrombosis of unspecified deep veins of left lower extremity: Secondary | ICD-10-CM

## 2020-08-05 HISTORY — DX: Acute embolism and thrombosis of unspecified deep veins of left lower extremity: I82.402

## 2020-08-10 ENCOUNTER — Inpatient Hospital Stay (HOSPITAL_COMMUNITY)
Admission: EM | Admit: 2020-08-10 | Discharge: 2020-08-13 | DRG: 175 | Disposition: A | Discharge status: Home / Self Care | Payer: 59 | Attending: Internal Medicine | Admitting: Internal Medicine

## 2020-08-10 ENCOUNTER — Other Ambulatory Visit: Payer: Self-pay

## 2020-08-10 ENCOUNTER — Encounter: Payer: Self-pay | Admitting: Emergency Medicine

## 2020-08-10 ENCOUNTER — Emergency Department (HOSPITAL_COMMUNITY): Payer: 59

## 2020-08-10 ENCOUNTER — Ambulatory Visit
Admission: EM | Admit: 2020-08-10 | Discharge: 2020-08-10 | Disposition: A | Discharge status: Other Acute Inpatient Hospital | Payer: 59

## 2020-08-10 ENCOUNTER — Encounter (HOSPITAL_COMMUNITY): Payer: Self-pay

## 2020-08-10 DIAGNOSIS — R42 Dizziness and giddiness: Secondary | ICD-10-CM | POA: Diagnosis present

## 2020-08-10 DIAGNOSIS — K760 Fatty (change of) liver, not elsewhere classified: Secondary | ICD-10-CM | POA: Diagnosis present

## 2020-08-10 DIAGNOSIS — Z79899 Other long term (current) drug therapy: Secondary | ICD-10-CM

## 2020-08-10 DIAGNOSIS — T7840XS Allergy, unspecified, sequela: Secondary | ICD-10-CM

## 2020-08-10 DIAGNOSIS — Z751 Person awaiting admission to adequate facility elsewhere: Secondary | ICD-10-CM

## 2020-08-10 DIAGNOSIS — Z20822 Contact with and (suspected) exposure to covid-19: Secondary | ICD-10-CM | POA: Diagnosis present

## 2020-08-10 DIAGNOSIS — E119 Type 2 diabetes mellitus without complications: Secondary | ICD-10-CM | POA: Diagnosis present

## 2020-08-10 DIAGNOSIS — Z9119 Patient's noncompliance with other medical treatment and regimen: Secondary | ICD-10-CM

## 2020-08-10 DIAGNOSIS — G4733 Obstructive sleep apnea (adult) (pediatric): Secondary | ICD-10-CM | POA: Diagnosis present

## 2020-08-10 DIAGNOSIS — I82452 Acute embolism and thrombosis of left peroneal vein: Secondary | ICD-10-CM | POA: Diagnosis present

## 2020-08-10 DIAGNOSIS — Z86718 Personal history of other venous thrombosis and embolism: Secondary | ICD-10-CM

## 2020-08-10 DIAGNOSIS — I2609 Other pulmonary embolism with acute cor pulmonale: Secondary | ICD-10-CM | POA: Diagnosis not present

## 2020-08-10 DIAGNOSIS — Z96641 Presence of right artificial hip joint: Secondary | ICD-10-CM | POA: Diagnosis present

## 2020-08-10 DIAGNOSIS — Z7984 Long term (current) use of oral hypoglycemic drugs: Secondary | ICD-10-CM

## 2020-08-10 DIAGNOSIS — I2699 Other pulmonary embolism without acute cor pulmonale: Secondary | ICD-10-CM | POA: Diagnosis not present

## 2020-08-10 DIAGNOSIS — I5032 Chronic diastolic (congestive) heart failure: Secondary | ICD-10-CM | POA: Diagnosis present

## 2020-08-10 DIAGNOSIS — I2692 Saddle embolus of pulmonary artery without acute cor pulmonale: Secondary | ICD-10-CM | POA: Diagnosis not present

## 2020-08-10 DIAGNOSIS — Z6841 Body Mass Index (BMI) 40.0 and over, adult: Secondary | ICD-10-CM | POA: Diagnosis not present

## 2020-08-10 DIAGNOSIS — J9601 Acute respiratory failure with hypoxia: Secondary | ICD-10-CM | POA: Diagnosis present

## 2020-08-10 DIAGNOSIS — Z880 Allergy status to penicillin: Secondary | ICD-10-CM

## 2020-08-10 DIAGNOSIS — E1165 Type 2 diabetes mellitus with hyperglycemia: Secondary | ICD-10-CM | POA: Diagnosis present

## 2020-08-10 LAB — BASIC METABOLIC PANEL
Anion gap: 10 (ref 5–15)
BUN: 13 mg/dL (ref 6–20)
CO2: 25 mmol/L (ref 22–32)
Calcium: 9 mg/dL (ref 8.9–10.3)
Chloride: 98 mmol/L (ref 98–111)
Creatinine, Ser: 1.11 mg/dL (ref 0.61–1.24)
GFR, Estimated: >60 mL/min (ref >60)
Glucose, Bld: 373 mg/dL — ABNORMAL HIGH (ref 70–99)
Potassium: 3.9 mmol/L (ref 3.5–5.1)
Sodium: 133 mmol/L — ABNORMAL LOW (ref 135–145)

## 2020-08-10 LAB — CBC WITH DIFFERENTIAL/PLATELET
Abs Immature Granulocytes: 0.03 10*3/uL (ref 0.00–0.07)
Basophils Absolute: 0 10*3/uL (ref 0.0–0.1)
Basophils Relative: 0 %
Eosinophils Absolute: 0.3 10*3/uL (ref 0.0–0.5)
Eosinophils Relative: 3 %
HCT: 43.5 % (ref 39.0–52.0)
Hemoglobin: 13 g/dL (ref 13.0–17.0)
Immature Granulocytes: 0 %
Lymphocytes Relative: 26 %
Lymphs Abs: 2.7 10*3/uL (ref 0.7–4.0)
MCH: 23.9 pg — ABNORMAL LOW (ref 26.0–34.0)
MCHC: 29.9 g/dL — ABNORMAL LOW (ref 30.0–36.0)
MCV: 79.8 fL — ABNORMAL LOW (ref 80.0–100.0)
Monocytes Absolute: 0.6 10*3/uL (ref 0.1–1.0)
Monocytes Relative: 6 %
Neutro Abs: 6.9 10*3/uL (ref 1.7–7.7)
Neutrophils Relative %: 65 %
Platelets: 345 10*3/uL (ref 150–400)
RBC: 5.45 MIL/uL (ref 4.22–5.81)
RDW: 15 % (ref 11.5–15.5)
WBC: 10.6 10*3/uL — ABNORMAL HIGH (ref 4.0–10.5)
nRBC: 0 % (ref 0.0–0.2)

## 2020-08-10 LAB — I-STAT VENOUS BLOOD GAS, ED
Acid-Base Excess: 7 mmol/L — ABNORMAL HIGH (ref 0.0–2.0)
Bicarbonate: 30.5 mmol/L — ABNORMAL HIGH (ref 20.0–28.0)
Calcium, Ion: 1.02 mmol/L — ABNORMAL LOW (ref 1.15–1.40)
HCT: 44 % (ref 39.0–52.0)
Hemoglobin: 15 g/dL (ref 13.0–17.0)
O2 Saturation: 99 %
Potassium: 3.9 mmol/L (ref 3.5–5.1)
Sodium: 136 mmol/L (ref 135–145)
TCO2: 32 mmol/L (ref 22–32)
pCO2, Ven: 38.8 mmHg — ABNORMAL LOW (ref 44.0–60.0)
pH, Ven: 7.504 — ABNORMAL HIGH (ref 7.250–7.430)
pO2, Ven: 119 mmHg — ABNORMAL HIGH (ref 32.0–45.0)

## 2020-08-10 LAB — RESP PANEL BY RT-PCR (FLU A&B, COVID) ARPGX2
Influenza A by PCR: NEGATIVE
Influenza B by PCR: NEGATIVE
SARS Coronavirus 2 by RT PCR: NEGATIVE

## 2020-08-10 LAB — D-DIMER, QUANTITATIVE: D-Dimer, Quant: 3.36 ug/mL-FEU — ABNORMAL HIGH (ref 0.00–0.50)

## 2020-08-10 LAB — TROPONIN I (HIGH SENSITIVITY)
Troponin I (High Sensitivity): 11 ng/L (ref <18)
Troponin I (High Sensitivity): 9 ng/L (ref <18)

## 2020-08-10 LAB — BRAIN NATRIURETIC PEPTIDE: B Natriuretic Peptide: 26.9 pg/mL (ref 0.0–100.0)

## 2020-08-10 MED ORDER — INSULIN ASPART 100 UNIT/ML IJ SOLN
0.0000 [IU] | Freq: Three times a day (TID) | INTRAMUSCULAR | Status: DC
  Administered 2020-08-11: 9 [IU] via SUBCUTANEOUS
  Administered 2020-08-11: 5 [IU] via SUBCUTANEOUS
  Administered 2020-08-11: 7 [IU] via SUBCUTANEOUS
  Administered 2020-08-12 (×3): 5 [IU] via SUBCUTANEOUS
  Administered 2020-08-13: 3 [IU] via SUBCUTANEOUS

## 2020-08-10 MED ORDER — ONDANSETRON HCL 4 MG/2ML IJ SOLN: 4.0000 mg | Freq: Four times a day (QID) | INTRAMUSCULAR | Status: DC | PRN

## 2020-08-10 MED ORDER — IOHEXOL 350 MG/ML SOLN
50.0000 mL | Freq: Once | INTRAVENOUS | Status: AC | PRN
  Administered 2020-08-10: 50 mL via INTRAVENOUS

## 2020-08-10 MED ORDER — HEPARIN BOLUS VIA INFUSION: 4000.0000 [IU] | Freq: Once | INTRAVENOUS | Status: DC

## 2020-08-10 MED ORDER — POLYSACCHARIDE IRON COMPLEX 150 MG PO CAPS
150.0000 mg | ORAL_CAPSULE | Freq: Every day | ORAL | Status: DC
  Administered 2020-08-11 – 2020-08-13 (×3): 150 mg via ORAL
  Filled 2020-08-10 (×4): qty 1

## 2020-08-10 MED ORDER — HEPARIN (PORCINE) 25000 UT/250ML-% IV SOLN
3000.0000 [IU]/h | INTRAVENOUS | Status: DC
  Administered 2020-08-10 – 2020-08-11 (×2): 2000 [IU]/h via INTRAVENOUS
  Administered 2020-08-11: 2400 [IU]/h via INTRAVENOUS
  Administered 2020-08-12: 2650 [IU]/h via INTRAVENOUS
  Filled 2020-08-10 (×4): qty 250

## 2020-08-10 MED ORDER — HEPARIN BOLUS VIA INFUSION
6000.0000 [IU] | Freq: Once | INTRAVENOUS | Status: AC
  Administered 2020-08-10: 6000 [IU] via INTRAVENOUS
  Filled 2020-08-10: qty 6000

## 2020-08-10 MED ORDER — MELATONIN 3 MG PO TABS: 3.0000 mg | ORAL_TABLET | Freq: Every evening | ORAL | Status: DC | PRN

## 2020-08-10 MED ORDER — ACETAMINOPHEN 325 MG PO TABS
650.0000 mg | ORAL_TABLET | Freq: Four times a day (QID) | ORAL | Status: DC | PRN
  Administered 2020-08-11 – 2020-08-13 (×4): 650 mg via ORAL
  Filled 2020-08-10 (×4): qty 2

## 2020-08-10 MED ORDER — INSULIN ASPART 100 UNIT/ML IJ SOLN
0.0000 [IU] | Freq: Every day | INTRAMUSCULAR | Status: DC
  Administered 2020-08-11 (×2): 4 [IU] via SUBCUTANEOUS
  Administered 2020-08-12: 2 [IU] via SUBCUTANEOUS

## 2020-08-10 NOTE — ED Notes (Signed)
EMS called to transport pt to hospital 

## 2020-08-10 NOTE — ED Provider Notes (Signed)
[facility] EMERGENCY DEPARTMENT Provider Note   CSN: [id number] Arrival date & time: 08/10/20  1758     History No chief complaint on file.   Chad Rivas is a 55 y.o. male.  Patient presents ER chief complaint of shortness of breath lightheadedness and some chest tightness.  Symptoms been ongoing for about 2 days.  He denies loss of consciousness or fevers or cough.  He states symptoms have not really improved but are worse when he walks across the room or does any exertion of activity.  Denies any recent contacts, otherwise previously vaccinated for COVID.        Past Medical History:  Diagnosis Date  . Diabetes mellitus without complication (HCC)   . Sleep apnea    pt has CPAP but does not use     Patient Active Problem List   Diagnosis Date Noted  . Abscess of groin, left 07/13/2017  . Type 2 diabetes mellitus with hyperglycemia, without long-term current use of insulin (HCC) 07/13/2017  . Morbidly obese (HCC) 07/13/2017    Past Surgical History:  Procedure Laterality Date  . IRRIGATION AND DEBRIDEMENT ABSCESS Right 07/13/2017   Procedure: INCISION AND DRAINAGE OF GROIN ABSCESS;  Surgeon: Ihor Gully, MD;  Location: [facility]S;  Service: Urology;  Laterality: Right;  . JOINT REPLACEMENT  2009   R hip  . TOE SURGERY    . TOTAL HIP ARTHROPLASTY         No family history on file.  Social History   Tobacco Use  . Smoking status: Never Smoker  . Smokeless tobacco: Never Used  Vaping Use  . Vaping Use: Never used  Substance Use Topics  . Alcohol use: No  . Drug use: No    Home Medications Prior to Admission medications   Medication Sig Start Date End Date Taking? Authorizing Provider  alum & mag hydroxide-simeth (MAALOX PLUS) 400-400-40 MG/5ML suspension Take 15 mLs by mouth every 6 (six) hours as needed for indigestion.    [provider]  ciprofloxacin (CIPRO) 500 MG tablet Take 1 tablet (500 mg total) by mouth 2 (two) times  daily. 05/11/19   Renne Crigler, PA-C  diphenhydrAMINE (BENADRYL) 25 MG tablet Take 1 tablet (25 mg total) by mouth every 6 (six) hours as needed for itching (Rash). 10/03/14   Roxy Horseman, PA-C  EPINEPHrine (EPIPEN 2-PAK) 0.3 mg/0.3 mL IJ SOAJ injection Inject 0.3 mLs (0.3 mg total) into the muscle once. Patient taking differently: Inject 0.3 mg into the muscle once as needed (For anaphylaxis.).  12/10/14   Kozlow, Alvira Philips, MD  FERREX 150 150 MG capsule Take 150 mg by mouth daily.  06/27/17   [provider]  fluticasone (FLONASE) 50 MCG/ACT nasal spray Place 1 spray into both nostrils daily as needed for allergies.  06/06/17   [provider]  glimepiride (AMARYL) 2 MG tablet Take 4 mg by mouth 2 (two) times daily. 04/29/19   [provider]  HYDROcodone-acetaminophen (NORCO) 10-325 MG tablet Take 1-2 tablets by mouth every 4 (four) hours as needed for moderate pain. Maximum dose per 24 hours - 8 pills 07/14/17   Ihor Gully, MD  ibuprofen (ADVIL,MOTRIN) 200 MG tablet Take 800 mg by mouth every 6 (six) hours as needed (for pain.).    [provider]  loperamide (IMODIUM A-D) 2 MG tablet Take 1 tablet (2 mg total) by mouth 4 (four) times daily as needed for diarrhea or loose stools. 09/07/16   Vanetta Mulders,  MD  loratadine (CLARITIN) 10 MG tablet TAKE TWO TABLETS ONCE DAILY. Patient taking differently: Take 10 mg by mouth 2 (two) times daily as needed for allergies.  12/10/14   Kozlow, Alvira Philips, MD  metFORMIN (GLUCOPHAGE) 500 MG tablet Take 500 mg by mouth 2 (two) times daily with a meal.    [provider]  metroNIDAZOLE (FLAGYL) 500 MG tablet Take 1 tablet (500 mg total) by mouth 3 (three) times daily. 05/11/19   Renne Crigler, PA-C  Multiple Vitamins-Minerals (MULTIVITAMIN ADULT) TABS Take 1 tablet by mouth daily.    [provider]  ondansetron (ZOFRAN ODT) 4 MG disintegrating tablet Take 1 tablet (4 mg total) by mouth every 8 (eight) hours as  needed for nausea or vomiting. 05/11/19   Renne Crigler, PA-C  oxymetazoline (AFRIN) 0.05 % nasal spray Place 1 spray into both nostrils 2 (two) times daily as needed for congestion.    [provider]  sulfamethoxazole-trimethoprim (BACTRIM DS,SEPTRA DS) 800-160 MG tablet Take 1 tablet by mouth 2 (two) times daily. 07/14/17   Ihor Gully, MD    Allergies    Penicillins  Review of Systems   Review of Systems  Constitutional: Negative for fever.  HENT: Negative for ear pain and sore throat.   Eyes: Negative for pain.  Respiratory: Positive for shortness of breath. Negative for cough.   Cardiovascular: Positive for chest pain.  Gastrointestinal: Negative for abdominal pain.  Genitourinary: Negative for flank pain.  Musculoskeletal: Negative for back pain.  Skin: Negative for color change and rash.  Neurological: Negative for syncope.  All other systems reviewed and are negative.   Physical Exam Updated Vital Signs BP 119/77 (BP Location: Left Arm)   Pulse 90   Temp 98.6 F (37 C) (Oral)   Resp 16   Ht 5\' 10"  (1.778 m)   Wt (!) 160.1 kg   SpO2 94%   BMI 50.65 kg/m   Physical Exam Constitutional:      General: He is not in acute distress.    Appearance: He is well-developed.  HENT:     Head: Normocephalic.     Nose: Nose normal.  Eyes:     Extraocular Movements: Extraocular movements intact.  Cardiovascular:     Rate and Rhythm: Tachycardia present.  Pulmonary:     Effort: Pulmonary effort is normal.  Skin:    Coloration: Skin is not jaundiced.  Neurological:     Mental Status: He is alert. Mental status is at baseline.     ED Results / Procedures / Treatments   Labs (all labs ordered are listed, but only abnormal results are displayed) Labs Reviewed  BASIC METABOLIC PANEL - Abnormal; Notable for the following components:      Result Value   Sodium 133 (*)    Glucose, Bld 373 (*)    All other components within normal limits  CBC WITH  DIFFERENTIAL/PLATELET - Abnormal; Notable for the following components:   WBC 10.6 (*)    MCV 79.8 (*)    MCH 23.9 (*)    MCHC 29.9 (*)    All other components within normal limits  D-DIMER, QUANTITATIVE - Abnormal; Notable for the following components:   D-Dimer, Quant 3.36 (*)    All other components within normal limits  I-STAT VENOUS BLOOD GAS, ED - Abnormal; Notable for the following components:   pH, Ven 7.504 (*)    pCO2, Ven 38.8 (*)    pO2, Ven 119.0 (*)    Bicarbonate 30.5 (*)  Acid-Base Excess 7.0 (*)    Calcium, Ion 1.02 (*)    All other components within normal limits  RESP PANEL BY RT-PCR (FLU A&B, COVID) ARPGX2  BRAIN NATRIURETIC PEPTIDE  BLOOD GAS, VENOUS  PROTIME-INR  HEPARIN LEVEL (UNFRACTIONATED)  CBC  TROPONIN I (HIGH SENSITIVITY)  TROPONIN I (HIGH SENSITIVITY)    EKG None  Radiology DG Chest 2 View  Result Date: 08/10/2020 CLINICAL DATA:  Shortness of breath, hypoxia EXAM: CHEST - 2 VIEW COMPARISON:  07/13/2017 FINDINGS: The heart size and mediastinal contours are within normal limits. Diffuse bilateral interstitial prominence. No focal airspace consolidation, pleural effusion, or pneumothorax. The visualized skeletal structures are unremarkable. IMPRESSION: Diffuse bilateral interstitial prominence, which may reflect bronchitic type lung changes versus mild interstitial edema. Electronically Signed   By: Duanne GuessNicholas  Plundo D.O.   On: 08/10/2020 19:35   CT Angio Chest PE W and/or Wo Contrast  Result Date: 08/10/2020 CLINICAL DATA:  Shortness of breath. Chest pain. Elevated D-dimer. Suspected pulmonary embolus. EXAM: CT ANGIOGRAPHY CHEST WITH CONTRAST TECHNIQUE: Multidetector CT imaging of the chest was performed using the standard protocol during bolus administration of intravenous contrast. Multiplanar CT image reconstructions and MIPs were obtained to evaluate the vascular anatomy. CONTRAST:  50mL OMNIPAQUE IOHEXOL 350 MG/ML SOLN COMPARISON:  CT angiography  chest 07/13/2017 FINDINGS: Cardiovascular: Poor opacification of the pulmonary arteries to the proximal segmental level due to timing of contrast and respiratory motion artifact. Distal central right main pulmonary artery filling defect extending to the segmental and subsegmental levels of all 3 right lobes. Segmental and subsegmental left upper lobe pulmonary artery filling defects. Cleary subsegmental left lower lobe pulmonary artery filling defects. No evidence of pulmonary embolism. Enlarged right to left ventricular ratio. No pericardial effusion. Mediastinum/Nodes: No enlarged mediastinal, hilar, or axillary lymph nodes. Thyroid gland, trachea, and esophagus demonstrate no significant findings. Lungs/Pleura: Stable subpleural left lower lobe nodules (6:87, 99) measuring 8 mm and 6 mm. Lungs are clear. No pleural effusion or pneumothorax. Upper Abdomen: Hepatic steatosis.  Otherwise no acute abnormality. Musculoskeletal: No chest wall abnormality. No acute or significant osseous findings. Review of the MIP images confirms the above findings. IMPRESSION: 1. Distal central right pulmonary embolus extending to the segmental and subsegmental levels of all three right lobes. Segmental and subsegmental left upper lobe pulmonary embolus. Associated right heart strain. No pulmonary infarction. 2. Hepatic steatosis. These results were called by telephone at the time of interpretation on 08/10/2020 at 10:11 pm to provider Dr. Jacqulyn BathLong , who verbally acknowledged these results and will relay the information to Dr. Audley HoseHong. Electronically Signed   By: Tish FredericksonMorgane  Naveau M.D.   On: 08/10/2020 22:13    Procedures .Critical Care Performed by: Cheryll CockayneHong, Caetano Oberhaus S, MD Authorized by: Cheryll CockayneHong, Ismar Yabut S, MD   Critical care provider statement:    Critical care time (minutes):  40   Critical care time was exclusive of:  Separately billable procedures and treating other patients   Critical care was necessary to treat or prevent imminent or  life-threatening deterioration of the following conditions:  Cardiac failure and circulatory failure     Medications Ordered in ED Medications  heparin bolus via infusion 6,000 Units (has no administration in time range)  heparin ADULT infusion 100 units/mL (25000 units/21450mL) (has no administration in time range)  iohexol (OMNIPAQUE) 350 MG/ML injection 50 mL (50 mLs Intravenous Contrast Given 08/10/20 2153)    ED Course  I have reviewed the triage vital signs and the nursing notes.  Pertinent labs &  imaging results that were available during my care of the patient were reviewed by me and considered in my medical decision making (see chart for details).    MDM Rules/Calculators/A&P                          Work-up is positive on CT angio for right-sided pulmonary emboli.  There is some evidence of right heart strain on the CT image.  Case discussed with pulmonary care physician, recommending IV heparin for now.  Will be admitted to the medical team.  Final Clinical Impression(s) / ED Diagnoses Final diagnoses:  Other acute pulmonary embolism, unspecified whether acute cor pulmonale present [facility])    Rx / DC Orders ED Discharge Orders    None       Cheryll Cockayne, MD 08/10/20 2238

## 2020-08-10 NOTE — Consult Note (Signed)
NAME:  Chad Rivas, MRN:  [id number], DOB:  May 04, 1965, LOS: 0 ADMISSION DATE:  08/10/2020, CONSULTATION DATE:  08/10/20 REFERRING MD:  Audley Hose, CHIEF COMPLAINT:  SOB   History of Present Illness:  Chad Rivas is a 55 year old man presenting with progressive SOB.  PMX notable for DM2, OSA, morbid obesity. Hx of blood clot 15 years ago post hip surgery. Currently employed as a Merchandiser, retail. States that he has been sitting a lot lately. States that he is up to date on his colonoscopies and is having his PSA checked. Denies smoking and family history of blood clots.  Over the past 2-3 days the patient became progressively SOB. On 6/6 he presented to urgent care and was Found to be hypoxemic. He was sent to the [facility] ED.  ED workup was notable for a CTA showing PE. BNP and troponin WNL. D dimer elevated in ER leading to CTA showing PE.   PCCM consulted to assist with potential RH strain.  Pertinent  Medical History  Type 2 DM, OSA, Morbid obesity  Significant Hospital Events: Including procedures, antibiotic start and stop dates in addition to other pertinent events   . 6/6 Presented to urgent care with SOB, transferred to [facility]. CTA showed PE. Heparin drip started.  Interim History / Subjective:  See above  Subjective: endorses SOB and dizziness. Endorses one episode of hemoptysis after coughing earlier in day. Denies chest pain.   Objective   Blood pressure 119/77, pulse 90, temperature 98.6 F (37 C), temperature source Oral, resp. rate 16, SpO2 94 %. 1LNC       No intake or output data in the 24 hours ending 08/10/20 2224 There were no vitals filed for this visit.  Examination: General:  In bed, no acute distress, obese HEENT: MM pink/moist, anicteric, atraumatic  Neuro: GCS 15, RASS 0, PERRL 35mm CV: S1S2, NSR, no m/r/g appreciated PULM: clear  in the upper lobes, clear in the lower lobes, chest expansion symmetric, trachea midline  GI: soft, bsx4 active, nondistended Extremities:  warm/dry, no pretibial edema, capillary refill less than 3 seconds  Skin: no rashes or lesions   Labs/imaging that I havepersonally reviewed  (right click and "Reselect all SmartList Selections" daily)  BNP 36.9 Trop <0.03 ABG 7.5/38.8/119/30  BMP-Hyperglcemia CBC- slight leukocytosis Ddimer 3.36 CXR- No pneumo of effusion CTA chest- Poorly timed contrast, +PE 12 lead- ST, no st chages Resolved Hospital Problem list     Assessment & Plan:  Pulmonary Embolism-Unprovoked Acute Respiratory Failure with Hypoxia-Secondary to PE Hx OSA Hx DVT- 15 years ago CTA chest +for PE, D dimer 3.36. Trop and BNP WNL. CXR no pneumothorax or effusion. History of provoked blood clot 15 years ago. Obese. States he has been sedentary at job recently. CT PE contrast poorly timed- difficult to evaluate RV strain. On 1 L [place]. Complaints of dizziness that has improved since presentation.  PESI score 62- Class 1, very low risk. -Continue heparin drip. Goal to transition to a DOAC in the AM. Second blood clot. Will likely need lifelong AC. -Obtain ECHO to evaluate for RV strain. -Obtain LE duplex to assess for DVT -Obtain walking pulse oxemetry in AM. -Dr. Francine Graven will follow up in AM to review ECHO and discuss with patient. -If home O2 is needed, needs outpatient follow up with Pulmonary in 4 weeks. -CPAP at night for OSA -Goal Spo2 92-98. Wean O2 to goal.  Hx DM -Start SSI, novolog sensitive -Blood Glucose goal 140-180.  Hepatic Steatosis  On 6/6 CTA -Follow up outpatient with PCP   Labs   CBC: Recent Labs  Lab 08/10/20 1819 08/10/20 1842  WBC 10.6*  --   NEUTROABS 6.9  --   HGB 13.0 15.0  HCT 43.5 44.0  MCV 79.8*  --   PLT 345  --     Basic Metabolic Panel: Recent Labs  Lab 08/10/20 1819 08/10/20 1842  NA 133* 136  K 3.9 3.9  CL 98  --   CO2 25  --   GLUCOSE 373*  --   BUN 13  --   CREATININE 1.11  --   CALCIUM 9.0  --    GFR: CrCl cannot be calculated (Unknown ideal  weight.). Recent Labs  Lab 08/10/20 1819  WBC 10.6*    Liver Function Tests: No results for input(s): AST, ALT, ALKPHOS, BILITOT, PROT, ALBUMIN in the last 168 hours. No results for input(s): LIPASE, AMYLASE in the last 168 hours. No results for input(s): AMMONIA in the last 168 hours.  ABG    Component Value Date/Time   HCO3 30.5 (H) 08/10/2020 1842   TCO2 32 08/10/2020 1842   O2SAT 99.0 08/10/2020 1842     Coagulation Profile: No results for input(s): INR, PROTIME in the last 168 hours.  Cardiac Enzymes: No results for input(s): CKTOTAL, CKMB, CKMBINDEX, TROPONINI in the last 168 hours.  HbA1C: Hgb A1c MFr Bld  Date/Time Value Ref Range Status  07/14/2017 05:30 AM 14.2 (H) 4.8 - 5.6 % Final    Comment:    (NOTE) Pre diabetes:          5.7%-6.4% Diabetes:              >6.4% Glycemic control for   <7.0% adults with diabetes     CBG: No results for input(s): GLUCAP in the last 168 hours.  Review of Systems:   Positives in bold  Gen: fever, chills, 10 pound weight loss, fatigue, night sweats HEENT:  blurred vision, double vision, hearing loss, tinnitus, sinus congestion, rhinorrhea, sore throat, neck stiffness, dysphagia PULM:  shortness of breath, cough, sputum production, hemoptysis, wheezing CV: chest pain, edema, orthopnea, paroxysmal nocturnal dyspnea, palpitations GI:  abdominal pain, nausea, vomiting, diarrhea, hematochezia, melena, constipation, change in bowel habits GU: dysuria, hematuria, polyuria, oliguria, urethral discharge Endocrine: hot or cold intolerance, polyuria, polyphagia or appetite change Derm: rash, dry skin, scaling or peeling skin change Heme: easy bruising, bleeding, bleeding gums Neuro: headache, numbness, weakness, slurred speech, loss of memory or consciousness, Dizziness   Past Medical History:  He,  has a past medical history of Diabetes mellitus without complication (HCC) and Sleep apnea.   Surgical History:   Past Surgical  History:  Procedure Laterality Date  . IRRIGATION AND DEBRIDEMENT ABSCESS Right 07/13/2017   Procedure: INCISION AND DRAINAGE OF GROIN ABSCESS;  Surgeon: Ihor Gully, MD;  Location: [facility]S;  Service: Urology;  Laterality: Right;  . JOINT REPLACEMENT  2009   R hip  . TOE SURGERY    . TOTAL HIP ARTHROPLASTY       Social History:   reports that he has never smoked. He has never used smokeless tobacco. He reports that he does not drink alcohol and does not use drugs.   Family History:  His family history is not on file.   Allergies Allergies  Allergen Reactions  . Penicillins Anaphylaxis and Other (See Comments)    Has patient had a PCN reaction causing immediate rash, facial/tongue/throat swelling, SOB or lightheadedness with hypotension:  Y Has patient had a PCN reaction causing severe rash involving mucus membranes or skin necrosis: Y Has patient had a PCN reaction that required hospitalization: Y Has patient had a PCN reaction occurring within the last 10 years: N If all of the above answers are "NO", then may proceed with Cephalosporin use.      Home Medications  Prior to Admission medications   Medication Sig Start Date End Date Taking? Authorizing Provider  alum & mag hydroxide-simeth (MAALOX PLUS) 400-400-40 MG/5ML suspension Take 15 mLs by mouth every 6 (six) hours as needed for indigestion.    [provider]  ciprofloxacin (CIPRO) 500 MG tablet Take 1 tablet (500 mg total) by mouth 2 (two) times daily. 05/11/19   Renne Crigler, PA-C  diphenhydrAMINE (BENADRYL) 25 MG tablet Take 1 tablet (25 mg total) by mouth every 6 (six) hours as needed for itching (Rash). 10/03/14   Roxy Horseman, PA-C  EPINEPHrine (EPIPEN 2-PAK) 0.3 mg/0.3 mL IJ SOAJ injection Inject 0.3 mLs (0.3 mg total) into the muscle once. Patient taking differently: Inject 0.3 mg into the muscle once as needed (For anaphylaxis.).  12/10/14   Kozlow, Alvira Philips, MD  FERREX 150 150 MG capsule Take 150 mg by  mouth daily.  06/27/17   [provider]  fluticasone (FLONASE) 50 MCG/ACT nasal spray Place 1 spray into both nostrils daily as needed for allergies.  06/06/17   [provider]  glimepiride (AMARYL) 2 MG tablet Take 4 mg by mouth 2 (two) times daily. 04/29/19   [provider]  HYDROcodone-acetaminophen (NORCO) 10-325 MG tablet Take 1-2 tablets by mouth every 4 (four) hours as needed for moderate pain. Maximum dose per 24 hours - 8 pills 07/14/17   Ihor Gully, MD  ibuprofen (ADVIL,MOTRIN) 200 MG tablet Take 800 mg by mouth every 6 (six) hours as needed (for pain.).    [provider]  loperamide (IMODIUM A-D) 2 MG tablet Take 1 tablet (2 mg total) by mouth 4 (four) times daily as needed for diarrhea or loose stools. 09/07/16   Vanetta Mulders, MD  loratadine (CLARITIN) 10 MG tablet TAKE TWO TABLETS ONCE DAILY. Patient taking differently: Take 10 mg by mouth 2 (two) times daily as needed for allergies.  12/10/14   Kozlow, Alvira Philips, MD  metFORMIN (GLUCOPHAGE) 500 MG tablet Take 500 mg by mouth 2 (two) times daily with a meal.    [provider]  metroNIDAZOLE (FLAGYL) 500 MG tablet Take 1 tablet (500 mg total) by mouth 3 (three) times daily. 05/11/19   Renne Crigler, PA-C  Multiple Vitamins-Minerals (MULTIVITAMIN ADULT) TABS Take 1 tablet by mouth daily.    [provider]  ondansetron (ZOFRAN ODT) 4 MG disintegrating tablet Take 1 tablet (4 mg total) by mouth every 8 (eight) hours as needed for nausea or vomiting. 05/11/19   Renne Crigler, PA-C  oxymetazoline (AFRIN) 0.05 % nasal spray Place 1 spray into both nostrils 2 (two) times daily as needed for congestion.    [provider]  sulfamethoxazole-trimethoprim (BACTRIM DS,SEPTRA DS) 800-160 MG tablet Take 1 tablet by mouth 2 (two) times daily. 07/14/17   Ihor Gully, MD     Critical care time: N/A    Gershon Mussel., MSN, APRN, AGACNP-BC [facility]   08/11/2020 , 12:00 AM  Please see [email or web address] for pager details  If no response, please call [phone] After hours, please call Elink at [phone]

## 2020-08-10 NOTE — Progress Notes (Signed)
ANTICOAGULATION CONSULT NOTE - Initial Consult  Pharmacy Consult for heparin Indication: pulmonary embolus  Allergies  Allergen Reactions  . Penicillins Anaphylaxis and Other (See Comments)    Has patient had a PCN reaction causing immediate rash, facial/tongue/throat swelling, SOB or lightheadedness with hypotension: Y Has patient had a PCN reaction causing severe rash involving mucus membranes or skin necrosis: Y Has patient had a PCN reaction that required hospitalization: Y Has patient had a PCN reaction occurring within the last 10 years: N If all of the above answers are "NO", then may proceed with Cephalosporin use.     Patient Measurements: Height: 5\' 10"  (177.8 cm) Weight: (!) 160.1 kg (353 lb) IBW/kg (Calculated) : 73  Vital Signs: Temp: 98.6 F (37 C) (06/06 1919) Temp Source: Oral (06/06 1919) BP: 119/77 (06/06 2223) Pulse Rate: 90 (06/06 2223)  Labs: Recent Labs    08/10/20 1819 08/10/20 1842  HGB 13.0 15.0  HCT 43.5 44.0  PLT 345  --   CREATININE 1.11  --   TROPONINIHS 11  --     Estimated Creatinine Clearance: 118.7 mL/min (by C-G formula based on SCr of 1.11 mg/dL).   Medical History: Past Medical History:  Diagnosis Date  . Diabetes mellitus without complication (HCC)   . Sleep apnea    pt has CPAP but does not use     Medications:  (Not in a hospital admission)   Assessment: 63 YOM with acute bilateral PE and associated heart strain to start IV heparin.   H/H and Plt wnl. SCr wnl.   Goal of Therapy:  Heparin level 0.3-0.7 units/ml Monitor platelets by anticoagulation protocol: Yes   Plan:  -Heparin 6000 units IV bolus followed by heparin infusion at 2000 units/hr -F/u 6 hr HL -Monitor daily HL, CBC and s/s of bleeding  46, PharmD., BCPS, BCCCP Clinical Pharmacist Please refer to [facility] for unit-specific pharmacist

## 2020-08-10 NOTE — ED Triage Notes (Signed)
Patient arrived by [facility] with complaint of SOB with mild chest pressure and dizziness x 2 days. EMS reports BS greater than 400 and sats 80s on RA. Ems placed patient on oxygen at 4l and sats 96. Patient alert and oriented, denies fever, no chills NAD

## 2020-08-10 NOTE — ED Notes (Signed)
Pt is ambulatory to the bathroom without wearing his [place]. Pt was initially on 4L [place] due to RA sats at 86%. Pt stated that he felt fine when walking to the bathroom and did not feel SOB when ambulating. Pt's sats on RA were 88% as he laid in bed. 2L [place] was given to pt and sats were maintaining at 95%.

## 2020-08-10 NOTE — ED Triage Notes (Signed)
Pt here for SOB and CP worse with inspiration and cough starting yesterday; pt noted 82% of RA with no cardiac or lung hx; pt sts was SOB with small exertion yesterday and had some blood tinged sputum with coughing today; pt is diaphoretic but sts is norm; placed pt on 4L

## 2020-08-10 NOTE — ED Provider Notes (Signed)
Emergency Medicine Provider Triage Evaluation Note  Chad Rivas , a 55 y.o. male  was evaluated in triage.  Pt complains of he was a self-described "otherwise healthy big guy."  Sent in from [facility] urgent care for hypoxia.  Several days of worsening shortness of breath, orthopnea, PND, cough.  No history of the same, no noticeable swelling in the lower extremities.  Hypoxic to 85% on room air.  No recent fevers or chills  Review of Systems  Positive: sob Negative: fever  Physical Exam  There were no vitals taken for this visit. Gen:   Awake, no distress   Resp:  Normal effort  MSK:   Moves extremities without difficulty  Other:  No resp distress  Medical Decision Making  Medically screening exam initiated at 6:17 PM.  Appropriate orders placed.  Chad Rivas was informed that the remainder of the evaluation will be completed by another provider, this initial triage assessment does not replace that evaluation, and the importance of remaining in the ED until their evaluation is complete.  SOB- work up initiated   Arthor Captain, PA-C 08/10/20 1820    Maia Plan, MD 08/10/20 2016

## 2020-08-10 NOTE — H&P (Signed)
History and Physical  Chad FergusonLewis S Balaguer WUJ:[id number] DOB: 06/01/1965 DOA: 08/10/2020  Referring physician: Dr. Audley HoseHong, EDP. PCP: Marden NobleGates, Robert, MD  Outpatient Specialists: Allergy and immunology. Patient coming from: Home.  Chief Complaint: Shortness of breath, chest discomfort.  HPI: Chad Rivas is a 55 y.o. male with medical history significant for penicillin allergy, type 2 diabetes, morbid obesity, OSA noncompliant with CPAP, previous DVT after right hip replacement 15 years ago, who presented to [facility] ED from home due to worsening shortness of breath and chest discomfort of 2 days duration.  Associated with orthopnea, PND and nonproductive cough with occasional mild hemoptysis.  He initially went to urgent care, there he was found to have new hypoxia with O2 saturation of 80% on room air.  Was [facility] for further evaluation.  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  He denies any constitutional symptoms.  No recent lengthy trips.  A D-dimer was obtained in the ED and was elevated, prompting CT angiogram of the chest which revealed right-sided pulmonary embolism, segmental and subsegmental left upper lobe pulmonary embolus with associated right heart strain.  No pulmonary infarction.  Troponin and BNP negative.  Started on heparin drip in the ED.  TRH, hospitalist team, asked to admit.  ED Course: Temperature 98.6.  BP 145/85, pulse 94, respiratory 29, O2 saturation 91% on 1.5 L.  Lab studies remarkable for D-dimer 3.36.  BNP 26, troponin 11.  Serum sodium 136, potassium 3.9, BUN 13, creatinine 1.11 with GFR greater than 60.  WBC 10.6, hemoglobin 15, platelet 345.  Review of Systems: Review of systems as noted in the HPI. All other systems reviewed and are negative.   Past Medical History:  Diagnosis Date  . Diabetes mellitus without complication (HCC)   . Sleep apnea    pt has CPAP but does not use    Past Surgical History:  Procedure  Laterality Date  . IRRIGATION AND DEBRIDEMENT ABSCESS Right 07/13/2017   Procedure: INCISION AND DRAINAGE OF GROIN ABSCESS;  Surgeon: Ihor Gullyttelin, Mark, MD;  Location: [facility] ORS;  Service: Urology;  Laterality: Right;  . JOINT REPLACEMENT  2009   R hip  . TOE SURGERY    . TOTAL HIP ARTHROPLASTY      Social History:  reports that he has never smoked. He has never used smokeless tobacco. He reports that he does not drink alcohol and does not use drugs.   Allergies  Allergen Reactions  . Penicillins Anaphylaxis and Other (See Comments)    Has patient had a PCN reaction causing immediate rash, facial/tongue/throat swelling, SOB or lightheadedness with hypotension: Y Has patient had a PCN reaction causing severe rash involving mucus membranes or skin necrosis: Y Has patient had a PCN reaction that required hospitalization: Y Has patient had a PCN reaction occurring within the last 10 years: N If all of the above answers are "NO", then may proceed with Cephalosporin use.     Family history: Dad deceased at the age of 53 with leukemia and Hodgkin lymphoma.  Prior to Admission medications   Medication Sig Start Date End Date Taking? Authorizing Provider  alum & mag hydroxide-simeth (MAALOX PLUS) 400-400-40 MG/5ML suspension Take 15 mLs by mouth every 6 (six) hours as needed for indigestion.    [provider]  ciprofloxacin (CIPRO) 500 MG tablet Take 1 tablet (500 mg total) by mouth 2 (two) times daily. 05/11/19   Renne CriglerGeiple, Joshua, PA-C  diphenhydrAMINE (  BENADRYL) 25 MG tablet Take 1 tablet (25 mg total) by mouth every 6 (six) hours as needed for itching (Rash). 10/03/14   Roxy Horseman, PA-C  EPINEPHrine (EPIPEN 2-PAK) 0.3 mg/0.3 mL IJ SOAJ injection Inject 0.3 mLs (0.3 mg total) into the muscle once. Patient taking differently: Inject 0.3 mg into the muscle once as needed (For anaphylaxis.).  12/10/14   Kozlow, Alvira Philips, MD  FERREX 150 150 MG capsule Take 150 mg by mouth daily.  06/27/17    [provider]  fluticasone (FLONASE) 50 MCG/ACT nasal spray Place 1 spray into both nostrils daily as needed for allergies.  06/06/17   [provider]  glimepiride (AMARYL) 2 MG tablet Take 4 mg by mouth 2 (two) times daily. 04/29/19   [provider]  HYDROcodone-acetaminophen (NORCO) 10-325 MG tablet Take 1-2 tablets by mouth every 4 (four) hours as needed for moderate pain. Maximum dose per 24 hours - 8 pills 07/14/17   Ihor Gully, MD  ibuprofen (ADVIL,MOTRIN) 200 MG tablet Take 800 mg by mouth every 6 (six) hours as needed (for pain.).    [provider]  loperamide (IMODIUM A-D) 2 MG tablet Take 1 tablet (2 mg total) by mouth 4 (four) times daily as needed for diarrhea or loose stools. 09/07/16   Vanetta Mulders, MD  loratadine (CLARITIN) 10 MG tablet TAKE TWO TABLETS ONCE DAILY. Patient taking differently: Take 10 mg by mouth 2 (two) times daily as needed for allergies.  12/10/14   Kozlow, Alvira Philips, MD  metFORMIN (GLUCOPHAGE) 500 MG tablet Take 500 mg by mouth 2 (two) times daily with a meal.    [provider]  metroNIDAZOLE (FLAGYL) 500 MG tablet Take 1 tablet (500 mg total) by mouth 3 (three) times daily. 05/11/19   Renne Crigler, PA-C  Multiple Vitamins-Minerals (MULTIVITAMIN ADULT) TABS Take 1 tablet by mouth daily.    [provider]  ondansetron (ZOFRAN ODT) 4 MG disintegrating tablet Take 1 tablet (4 mg total) by mouth every 8 (eight) hours as needed for nausea or vomiting. 05/11/19   Renne Crigler, PA-C  oxymetazoline (AFRIN) 0.05 % nasal spray Place 1 spray into both nostrils 2 (two) times daily as needed for congestion.    [provider]  sulfamethoxazole-trimethoprim (BACTRIM DS,SEPTRA DS) 800-160 MG tablet Take 1 tablet by mouth 2 (two) times daily. 07/14/17   Ihor Gully, MD    Physical Exam: BP 119/77 (BP Location: Left Arm)   Pulse 90   Temp 98.6 F (37 C) (Oral)   Resp 16   Ht 5\' 10"  (1.778 m)   Wt (!) 160.1 kg    SpO2 94%   BMI 50.65 kg/m   . General: 55 y.o. year-old male well developed well nourished in no acute distress.  Alert and oriented x3.  Mild conversational dyspnea. . Cardiovascular: Regular rate and rhythm with no rubs or gallops.  No thyromegaly or JVD noted.  No lower extremity edema. 2/4 pulses in all 4 extremities. 44 Respiratory: Clear to auscultation with no wheezes or rales. Good inspiratory effort. . Abdomen: Soft nontender nondistended with normal bowel sounds x4 quadrants. . Muskuloskeletal: No cyanosis, clubbing or edema noted bilaterally . Neuro: CN II-XII intact, strength, sensation, reflexes . Skin: No ulcerative lesions noted or rashes . Psychiatry: Judgement and insight appear normal. Mood is appropriate for condition and setting          Labs on Admission:  Basic Metabolic Panel: Recent Labs  Lab 08/10/20 1819 08/10/20 1842  NA 133* 136  K 3.9 3.9  CL 98  --   CO2 25  --   GLUCOSE 373*  --   BUN 13  --   CREATININE 1.11  --   CALCIUM 9.0  --    Liver Function Tests: No results for input(s): AST, ALT, ALKPHOS, BILITOT, PROT, ALBUMIN in the last 168 hours. No results for input(s): LIPASE, AMYLASE in the last 168 hours. No results for input(s): AMMONIA in the last 168 hours. CBC: Recent Labs  Lab 08/10/20 1819 08/10/20 1842  WBC 10.6*  --   NEUTROABS 6.9  --   HGB 13.0 15.0  HCT 43.5 44.0  MCV 79.8*  --   PLT 345  --    Cardiac Enzymes: No results for input(s): CKTOTAL, CKMB, CKMBINDEX, TROPONINI in the last 168 hours.  BNP (last 3 results) Recent Labs    08/10/20 1819  BNP 26.9    ProBNP (last 3 results) No results for input(s): PROBNP in the last 8760 hours.  CBG: No results for input(s): GLUCAP in the last 168 hours.  Radiological Exams on Admission: DG Chest 2 View  Result Date: 08/10/2020 CLINICAL DATA:  Shortness of breath, hypoxia EXAM: CHEST - 2 VIEW COMPARISON:  07/13/2017 FINDINGS: The heart size and mediastinal contours are  within normal limits. Diffuse bilateral interstitial prominence. No focal airspace consolidation, pleural effusion, or pneumothorax. The visualized skeletal structures are unremarkable. IMPRESSION: Diffuse bilateral interstitial prominence, which may reflect bronchitic type lung changes versus mild interstitial edema. Electronically Signed   By: Duanne Guess D.O.   On: 08/10/2020 19:35   CT Angio Chest PE W and/or Wo Contrast  Result Date: 08/10/2020 CLINICAL DATA:  Shortness of breath. Chest pain. Elevated D-dimer. Suspected pulmonary embolus. EXAM: CT ANGIOGRAPHY CHEST WITH CONTRAST TECHNIQUE: Multidetector CT imaging of the chest was performed using the standard protocol during bolus administration of intravenous contrast. Multiplanar CT image reconstructions and MIPs were obtained to evaluate the vascular anatomy. CONTRAST:  59mL OMNIPAQUE IOHEXOL 350 MG/ML SOLN COMPARISON:  CT angiography chest 07/13/2017 FINDINGS: Cardiovascular: Poor opacification of the pulmonary arteries to the proximal segmental level due to timing of contrast and respiratory motion artifact. Distal central right main pulmonary artery filling defect extending to the segmental and subsegmental levels of all 3 right lobes. Segmental and subsegmental left upper lobe pulmonary artery filling defects. Cleary subsegmental left lower lobe pulmonary artery filling defects. No evidence of pulmonary embolism. Enlarged right to left ventricular ratio. No pericardial effusion. Mediastinum/Nodes: No enlarged mediastinal, hilar, or axillary lymph nodes. Thyroid gland, trachea, and esophagus demonstrate no significant findings. Lungs/Pleura: Stable subpleural left lower lobe nodules (6:87, 99) measuring 8 mm and 6 mm. Lungs are clear. No pleural effusion or pneumothorax. Upper Abdomen: Hepatic steatosis.  Otherwise no acute abnormality. Musculoskeletal: No chest wall abnormality. No acute or significant osseous findings. Review of the MIP images  confirms the above findings. IMPRESSION: 1. Distal central right pulmonary embolus extending to the segmental and subsegmental levels of all three right lobes. Segmental and subsegmental left upper lobe pulmonary embolus. Associated right heart strain. No pulmonary infarction. 2. Hepatic steatosis. These results were called by telephone at the time of interpretation on 08/10/2020 at 10:11 pm to provider Dr. Jacqulyn Bath , who verbally acknowledged these results and will relay the information to Dr. Audley Hose. Electronically Signed   By: Tish Frederickson M.D.   On: 08/10/2020 22:13    EKG: I independently viewed the EKG done and my findings are as followed:  Sinus tachycardia rate of 105.  Nonspecific ST-T changes.  QTc 454.  Assessment/Plan Present on Admission: . Acute pulmonary embolism (HCC)  Active Problems:   Acute pulmonary embolism (HCC)  Acute pulmonary embolism right-sided and left-sided with evidence of right heart strain on CT scan Prior history of DVT 15 years ago after right hip surgery. D-dimer elevated 3.36 which prompted CTA chest with findings as above. Troponin and BNP negative. Continue heparin drip started in the ED x48 hours, then switch to DOAC, Eliquis. Obtain 2D echo Obtain bilateral lower extremity Doppler ultrasound. Maintain O2 saturation greater than 92%. Likely DOAC indefinitely due to second episode of VTE.  Acute hypoxic respiratory failure secondary to acute pulmonary embolism Not on oxygen supplementation at baseline Currently requiring 2 L to maintain O2 saturation greater than 90% Maintain O2 saturation greater than 92%. Wean off oxygen supplementation as tolerated.  Type 2 diabetes with hyperglycemia Obtain hemoglobin A1c Hold off home hypoglycemics. Start insulin sliding scale.  Morbid obesity BMI greater than 50 Recommend weight loss outpatient with regular physical activity and healthy diet.  OSA, not compliant with CPAP. Needs counseling on the importance  of CPAP CPAP nightly.    DVT prophylaxis: Heparin drip.  Code Status: Full code.  Family Communication: None at bedside.  Disposition Plan: Admit to progressive unit.  Consults called: PCCM consulted by EDP.  Admission status: Inpatient status.  Patient will require at least 2 midnights for further evaluation and treatment of present condition.   Status is: Inpatient   Dispo:  Patient From: Home  Planned Disposition: Home, possibly on 08/12/2020 once symptomatology has improved.  Medically stable for discharge: No, ongoing management of acute pulmonary embolism and acute hypoxic respiratory failure.         Darlin Drop MD [facility] Pager [phone]  If 7PM-7AM, please contact night-coverage [email or web address] Password [facility]  08/10/2020, 10:49 PM

## 2020-08-11 ENCOUNTER — Inpatient Hospital Stay (HOSPITAL_COMMUNITY): Payer: 59

## 2020-08-11 DIAGNOSIS — I2609 Other pulmonary embolism with acute cor pulmonale: Secondary | ICD-10-CM

## 2020-08-11 DIAGNOSIS — I2699 Other pulmonary embolism without acute cor pulmonale: Secondary | ICD-10-CM

## 2020-08-11 DIAGNOSIS — J9601 Acute respiratory failure with hypoxia: Secondary | ICD-10-CM

## 2020-08-11 LAB — BASIC METABOLIC PANEL
Anion gap: 8 (ref 5–15)
BUN: 13 mg/dL (ref 6–20)
CO2: 29 mmol/L (ref 22–32)
Calcium: 8.9 mg/dL (ref 8.9–10.3)
Chloride: 96 mmol/L — ABNORMAL LOW (ref 98–111)
Creatinine, Ser: 0.91 mg/dL (ref 0.61–1.24)
GFR, Estimated: >60 mL/min (ref >60)
Glucose, Bld: 332 mg/dL — ABNORMAL HIGH (ref 70–99)
Potassium: 3.8 mmol/L (ref 3.5–5.1)
Sodium: 133 mmol/L — ABNORMAL LOW (ref 135–145)

## 2020-08-11 LAB — ECHOCARDIOGRAM COMPLETE
AR max vel: 2.78 cm2
AV Area VTI: 3.01 cm2
AV Area mean vel: 2.76 cm2
AV Mean grad: 3 mmHg
AV Peak grad: 5.6 mmHg
Ao pk vel: 1.18 m/s
Area-P 1/2: 5.06 cm2
Height: 70 in
S' Lateral: 3.4 cm
Weight: 5648 oz

## 2020-08-11 LAB — CBC
HCT: 41.5 % (ref 39.0–52.0)
Hemoglobin: 12.3 g/dL — ABNORMAL LOW (ref 13.0–17.0)
MCH: 23.7 pg — ABNORMAL LOW (ref 26.0–34.0)
MCHC: 29.6 g/dL — ABNORMAL LOW (ref 30.0–36.0)
MCV: 80 fL (ref 80.0–100.0)
Platelets: 299 10*3/uL (ref 150–400)
RBC: 5.19 MIL/uL (ref 4.22–5.81)
RDW: 15.2 % (ref 11.5–15.5)
WBC: 10.2 10*3/uL (ref 4.0–10.5)
nRBC: 0 % (ref 0.0–0.2)

## 2020-08-11 LAB — PHOSPHORUS: Phosphorus: 3.2 mg/dL (ref 2.5–4.6)

## 2020-08-11 LAB — GLUCOSE, CAPILLARY
Glucose-Capillary: 395 mg/dL — ABNORMAL HIGH (ref 70–99)
Glucose-Capillary: 334 mg/dL — ABNORMAL HIGH (ref 70–99)
Glucose-Capillary: 325 mg/dL — ABNORMAL HIGH (ref 70–99)

## 2020-08-11 LAB — MAGNESIUM: Magnesium: 1.4 mg/dL — ABNORMAL LOW (ref 1.7–2.4)

## 2020-08-11 LAB — APTT: aPTT: 35 seconds (ref 24–36)

## 2020-08-11 LAB — CBG MONITORING, ED
Glucose-Capillary: 323 mg/dL — ABNORMAL HIGH (ref 70–99)
Glucose-Capillary: 284 mg/dL — ABNORMAL HIGH (ref 70–99)

## 2020-08-11 LAB — TROPONIN I (HIGH SENSITIVITY): Troponin I (High Sensitivity): 10 ng/L (ref <18)

## 2020-08-11 LAB — PROTIME-INR
INR: 1.1 (ref 0.8–1.2)
Prothrombin Time: 13.7 seconds (ref 11.4–15.2)

## 2020-08-11 LAB — HEPARIN LEVEL (UNFRACTIONATED)
Heparin Unfractionated: <0.1 IU/mL — ABNORMAL LOW (ref 0.30–0.70)
Heparin Unfractionated: 0.21 IU/mL — ABNORMAL LOW (ref 0.30–0.70)

## 2020-08-11 MED ORDER — MAGNESIUM SULFATE 2 GM/50ML IV SOLN
2.0000 g | Freq: Once | INTRAVENOUS | Status: AC
  Administered 2020-08-11: 2 g via INTRAVENOUS
  Filled 2020-08-11: qty 50

## 2020-08-11 MED ORDER — HEPARIN BOLUS VIA INFUSION
3400.0000 [IU] | Freq: Once | INTRAVENOUS | Status: AC
  Administered 2020-08-11: 3400 [IU] via INTRAVENOUS
  Filled 2020-08-11: qty 3400

## 2020-08-11 MED ORDER — PERFLUTREN LIPID MICROSPHERE
1.0000 mL | INTRAVENOUS | Status: AC | PRN
  Administered 2020-08-11: 5 mL via INTRAVENOUS
  Filled 2020-08-11: qty 10

## 2020-08-11 MED ORDER — INSULIN ASPART 100 UNIT/ML IJ SOLN
5.0000 [IU] | Freq: Three times a day (TID) | INTRAMUSCULAR | Status: DC
  Administered 2020-08-11 – 2020-08-13 (×5): 5 [IU] via SUBCUTANEOUS

## 2020-08-11 MED ORDER — INSULIN GLARGINE 100 UNIT/ML ~~LOC~~ SOLN
15.0000 [IU] | Freq: Every day | SUBCUTANEOUS | Status: DC
  Administered 2020-08-12: 15 [IU] via SUBCUTANEOUS
  Filled 2020-08-11: qty 0.15

## 2020-08-11 MED ORDER — HEPARIN BOLUS VIA INFUSION
1650.0000 [IU] | Freq: Once | INTRAVENOUS | Status: AC
  Administered 2020-08-11: 1650 [IU] via INTRAVENOUS
  Filled 2020-08-11: qty 1650

## 2020-08-11 MED ORDER — GUAIFENESIN-DM 100-10 MG/5ML PO SYRP
5.0000 mL | ORAL_SOLUTION | ORAL | Status: DC | PRN
  Administered 2020-08-11: 5 mL via ORAL
  Filled 2020-08-11: qty 5

## 2020-08-11 MED ORDER — INSULIN GLARGINE 100 UNIT/ML ~~LOC~~ SOLN
10.0000 [IU] | Freq: Every day | SUBCUTANEOUS | Status: DC
  Administered 2020-08-11: 10 [IU] via SUBCUTANEOUS
  Filled 2020-08-11: qty 0.1

## 2020-08-11 NOTE — Progress Notes (Addendum)
ANTICOAGULATION CONSULT NOTE - Initial Consult  Pharmacy Consult for heparin Indication: pulmonary embolus  Allergies  Allergen Reactions  . Penicillins Anaphylaxis and Other (See Comments)    Has patient had a PCN reaction causing immediate rash, facial/tongue/throat swelling, SOB or lightheadedness with hypotension: Y Has patient had a PCN reaction causing severe rash involving mucus membranes or skin necrosis: Y Has patient had a PCN reaction that required hospitalization: Y Has patient had a PCN reaction occurring within the last 10 years: N If all of the above answers are "NO", then may proceed with Cephalosporin use.   . Losartan Other (See Comments)    Cause angioedema    Patient Measurements: Height: 5\' 10"  (177.8 cm) Weight: (!) 160.1 kg (353 lb) IBW/kg (Calculated) : 73  Heparin DW = 111.9  Vital Signs: BP: 130/72 (06/07 0715) Pulse Rate: 79 (06/07 0715)  Labs: Recent Labs    08/10/20 1819 08/10/20 1842 08/10/20 2121 08/11/20 0013 08/11/20 0531 08/11/20 0611  HGB 13.0 15.0  --   --  12.3*  --   HCT 43.5 44.0  --   --  41.5  --   PLT 345  --   --   --  299  --   APTT  --   --   --   --   --  35  LABPROT  --   --   --   --   --  13.7  INR  --   --   --   --   --  1.1  HEPARINUNFRC  --   --   --   --   --  <0.10*  CREATININE 1.11  --   --   --  0.91  --   TROPONINIHS 11  --  9 10  --   --     Estimated Creatinine Clearance: 144.8 mL/min (by C-G formula based on SCr of 0.91 mg/dL).   Medical History: Past Medical History:  Diagnosis Date  . Diabetes mellitus without complication (HCC)   . Sleep apnea    pt has CPAP but does not use     Medications:  (Not in a hospital admission)  Assessment: 71 YOM with acute bilateral PE and associated heart strain to start IV heparin.   H/H and Plt remain wnl. SCr wnl.   Heparin initiated at 2000 unit/hr with an initial 6000 unit bolus. Level returned <0.1. No bleeding or pauses in heparin noted.  Goal of  Therapy:  Heparin level 0.3-0.7 units/ml Monitor platelets by anticoagulation protocol: Yes   Plan:  -Heparin 3400 units IV bolus -Increase heparin infusion to 2400 units/hr -F/u 6 hr HL -Monitor daily HL, CBC and s/s of bleeding  44, PharmD, BCPS 08/11/2020 7:50 AM ED Clinical Pharmacist -  [phone]

## 2020-08-11 NOTE — Progress Notes (Signed)
PT Cancellation Note  Patient Details Name: Chad Rivas MRN: [id number] DOB: 02/08/1966   Cancelled Treatment:    Reason Eval/Treat Not Completed: Patient not medically ready Will hold on physical therapy evaluation until 24 hours post initiation of IV heparin in setting of PE.   Lillia Pauls, PT, DPT Acute Rehabilitation Services Pager [phone] Office [phone]    [name] 08/11/2020, 2:57 PM

## 2020-08-11 NOTE — Progress Notes (Addendum)
ANTICOAGULATION CONSULT NOTE - Follow Up Consult  Pharmacy Consult for IV Heparin Indication: pulmonary embolus  Allergies  Allergen Reactions  . Penicillins Anaphylaxis and Other (See Comments)    Has patient had a PCN reaction causing immediate rash, facial/tongue/throat swelling, SOB or lightheadedness with hypotension: Y Has patient had a PCN reaction causing severe rash involving mucus membranes or skin necrosis: Y Has patient had a PCN reaction that required hospitalization: Y Has patient had a PCN reaction occurring within the last 10 years: N If all of the above answers are "NO", then may proceed with Cephalosporin use.   . Losartan Other (See Comments)    Cause angioedema    Patient Measurements: Height: 5\' 10"  (177.8 cm) Weight: (!) 160.1 kg (353 lb) IBW/kg (Calculated) : 73  Heparin Dosing Weight: 111.9 kg  Vital Signs: Temp: 99.3 F (37.4 C) (06/07 1627) Temp Source: Oral (06/07 1627) BP: 142/86 (06/07 1627) Pulse Rate: 81 (06/07 1627)  Labs: Recent Labs    08/10/20 1819 08/10/20 1842 08/10/20 2121 08/11/20 0013 08/11/20 0531 08/11/20 0611 08/11/20 1600  HGB 13.0 15.0  --   --  12.3*  --   --   HCT 43.5 44.0  --   --  41.5  --   --   PLT 345  --   --   --  299  --   --   APTT  --   --   --   --   --  35  --   LABPROT  --   --   --   --   --  13.7  --   INR  --   --   --   --   --  1.1  --   HEPARINUNFRC  --   --   --   --   --  <0.10* 0.21*  CREATININE 1.11  --   --   --  0.91  --   --   TROPONINIHS 11  --  9 10  --   --   --     Estimated Creatinine Clearance: 144.8 mL/min (by C-G formula based on SCr of 0.91 mg/dL).   Medical History: Past Medical History:  Diagnosis Date  . Diabetes mellitus without complication (HCC)   . Sleep apnea    pt has CPAP but does not use     Assessment: 55 yr old man with acute bilateral PE and associated R  heart strain; pharmacy was consulted to dose IV heparin. Pt was on no anticoagulant PTA.  Vascular U/S  today: findings consistent with acute LLE DVT  Heparin level ~7 hrs after heparin 3400 units IV bolus X 1, followed by increasing heparin infusion to 2400 units/hr, was 0.21 units/ml, which is below the goal range for this pt. H/H 12.3/41.5, plt 299; Scr 0.91. Per RN, no issues with IV or bleeding observed.  Goal of Therapy:  Heparin level 0.3-0.7 units/ml Monitor platelets by anticoagulation protocol: Yes   Plan:  Heparin 1650 units IV bolus X 1 Increase heparin infusion to 2650 units/hr Check 6-hr heparin level Monitor daily heparin level, CBC Monitor for bleeding F/U transition to oral anticoagulant when able  07-04-1996, PharmD, BCPS, [facility] Clinical Pharmacist 08/11/2020 5:42 PM

## 2020-08-11 NOTE — Progress Notes (Signed)
Pt admitted to 5W32 from ED. A&O x4, VS stable. Tele monitor verified. Skin intact except where otherwise charted. Hep gtt @ 24. Call bell within reach. All questions/concerns addressed. Will continue to monitor.     08/11/20 1016  Vitals  Temp 98.7 F (37.1 C)  Temp Source Axillary  BP 137/84  MAP (mmHg) 103  BP Location Left Arm  BP Method Automatic  Patient Position (if appropriate) Sitting  Pulse Rate 92  Pulse Rate Source Monitor  ECG Heart Rate 91  Resp 18  Level of Consciousness  Level of Consciousness Alert  MEWS COLOR  MEWS Score Color Green  Oxygen Therapy  SpO2 93 %  O2 Device Nasal Cannula  O2 Flow Rate (L/min) 2 L/min  Pain Assessment  Pain Score 0  PCA/Epidural/Spinal Assessment  Respiratory Pattern Regular;Unlabored;Dyspnea with exertion  ECG Monitoring  Tele Box Verification Completed by Second Verifier Completed  MEWS Score  MEWS Temp 0  MEWS Systolic 0  MEWS Pulse 0  MEWS RR 0  MEWS LOC 0  MEWS Score 0

## 2020-08-11 NOTE — Progress Notes (Signed)
CSW received consult regarding benefit check for Xarelto and Eliquis. Check requested.  Joaquin Courts, MSW, [facility]

## 2020-08-11 NOTE — Progress Notes (Signed)
Bilateral lower extremity venous duplex completed. Refer to "CV Proc" under chart review to view preliminary results.  08/11/2020 1:50 PM Eula Fried., MHA, RVT, RDCS, RDMS

## 2020-08-11 NOTE — TOC Benefit Eligibility Note (Signed)
Transition of Care [facility]) Benefit Eligibility Note    Patient Details  Name: Chad Rivas MRN: [id number] Date of Birth: 05-Nov-1965   Medication/Dose: Alveda Reasons  15 MG BID CO-PAY-$35.00 and  XARELTO 20 MG DAILY CO-PAY- $35.00     RIVAROXABAN : NON-FORMULARY  Covered?: Yes  Tier: 2 Drug  Prescription Coverage [facility], [facility]  , [facility] TRANSITIONS OF CARE  Spoke with Person/Company/Phone Number:: NINA   @   [employer]  [phone]  Co-Pay: $35.00  Prior Approval: No  Deductible: Met (OUT-OF-POCKET:UNMET)  Additional Notes: ELIQUIS  5 MG BID  CO-PAY- $ 35.00  and  ELIQUIS  2.5 MG BID  CO-PAY- $35.00    ( APIXABAN , ELIQUIS 10 MG : NON-FORMULARY )    Memory Argue Phone Number: 08/11/2020, 2:09 PM

## 2020-08-11 NOTE — Progress Notes (Signed)
Patient states he does not wear CPAP at home and declines at this time. Patient currently on 2L [place]. Spo2 94%. Patient aware to call for Respiratory if he would like to use CPAP.

## 2020-08-11 NOTE — ED Notes (Signed)
SpO2 dropped to upper 80's, increased O2 to 2 lpm via [place], maintained SpO2 around 94-95%.

## 2020-08-11 NOTE — Progress Notes (Signed)
PROGRESS NOTE    Chad Rivas  BWI:[id number] DOB: 1965/07/19 DOA: 08/10/2020 PCP: Marden Noble, MD     Brief Narrative:  Chad Rivas is a 55 y.o. male with medical history significant for penicillin allergy, type 2 diabetes, morbid obesity, OSA noncompliant with CPAP, previous DVT after right hip replacement 15 years ago, who presented to [facility] ED from home due to worsening shortness of breath and chest discomfort of 2 days duration.  Associated with orthopnea, PND and nonproductive cough with occasional mild hemoptysis.  He initially went to urgent care, there he was found to have new hypoxia with O2 saturation of 80% on room air.  He was sent to [facility] ED for further evaluation.  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  D-dimer was obtained in the ED and was elevated, prompting CT angiogram of the chest which revealed right-sided pulmonary embolism, segmental and subsegmental left upper lobe pulmonary embolus with associated right heart strain.  No pulmonary infarction.  Troponin and BNP negative.  Started on heparin drip in the ED.  PCCM was consulted and patient admitted to the hospital.  New events last 24 hours / Subjective: Patient states that he continues to have a cough and chest pain associated with coughing episodes.  He states that his breathing has improved since being admitted to the hospital.  Did not notice any lower extremity edema.  Assessment & Plan:   Principal Problem:   Acute pulmonary embolism (HCC) Active Problems:   Type 2 diabetes mellitus with hyperglycemia, without long-term current use of insulin (HCC)   Morbidly obese (HCC)   Acute hypoxemic respiratory failure (HCC)   Acute PE with evidence of right heart strain on CT -This is his second episode, likely will be on anticoagulation indefinitely -Continue IV heparin for now -Echocardiogram and venous Dopplers pending -PCCM following  Acute hypoxemic respiratory  failure secondary to PE -Patient found to be satting 80% on room air, currently on 2 L.  Wean as able  Diabetes mellitus type 2 with hyperglycemia -Ha1c pending -Lantus, novolog, sliding scale insulin. Dose increased today   Morbid obesity Estimated body mass index is 50.65 kg/m as calculated from the following:   Height as of this encounter: 5\' 10"  (1.778 m).   Weight as of this encounter: 160.1 kg.  Hypomagnesemia -Replaced   DVT prophylaxis: IV heparin    Code Status:     Code Status Orders  (From admission, onward)         Start     Ordered   08/10/20 2240  Full code  Continuous        08/10/20 2239        Code Status History    Date Active Date Inactive Code Status Order ID Comments User Context   07/13/2017 2115 07/14/2017 1756 Full Code 09/13/2017  [id number], MD Inpatient   Advance Care Planning Activity     Family Communication: None at bedside  Disposition Plan:  Status is: Inpatient  Remains inpatient appropriate because:Ongoing diagnostic testing needed not appropriate for outpatient work up, IV treatments appropriate due to intensity of illness or inability to take PO and Inpatient level of care appropriate due to severity of illness   Dispo:  Patient From: Home  Planned Disposition: Home  Medically stable for discharge: No        Consultants:   PCCM  Procedures:   None   Antimicrobials:  Anti-infectives (From admission,  onward)   None        Objective: Vitals:   08/11/20 0715 08/11/20 0815 08/11/20 1016 08/11/20 1139  BP: 130/72  137/84 (!) 142/88  Pulse: 79  92 82  Resp: 19  18 20   Temp:  98.1 F (36.7 C) 98.7 F (37.1 C) 98.6 F (37 C)  TempSrc:  Oral Axillary Oral  SpO2: 90%  93% 94%  Weight:      Height:        Intake/Output Summary (Last 24 hours) at 08/11/2020 1310 Last data filed at 08/11/2020 0921 Gross per 24 hour  Intake 50 ml  Output 175 ml  Net -125 ml   Filed Weights   08/10/20 2223  Weight: (!)  160.1 kg    Examination:  General exam: Appears calm and comfortable  Respiratory system: Clear to auscultation. Respiratory effort normal. No respiratory distress. No conversational dyspnea. On 2 L O2 Cardiovascular system: S1 & S2 heard, RRR. No murmurs. No pedal edema. Gastrointestinal system: Abdomen is nondistended, soft and nontender. Normal bowel sounds heard. Central nervous system: Alert and oriented. No focal neurological deficits. Speech clear.  Extremities: Symmetric in appearance  Skin: No rashes, lesions or ulcers on exposed skin  Psychiatry: Judgement and insight appear normal. Mood & affect appropriate.   Data Reviewed: I have personally reviewed following labs and imaging studies  CBC: Recent Labs  Lab 08/10/20 1819 08/10/20 1842 08/11/20 0531  WBC 10.6*  --  10.2  NEUTROABS 6.9  --   --   HGB 13.0 15.0 12.3*  HCT 43.5 44.0 41.5  MCV 79.8*  --  80.0  PLT 345  --  299   Basic Metabolic Panel: Recent Labs  Lab 08/10/20 1819 08/10/20 1842 08/11/20 0531  NA 133* 136 133*  K 3.9 3.9 3.8  CL 98  --  96*  CO2 25  --  29  GLUCOSE 373*  --  332*  BUN 13  --  13  CREATININE 1.11  --  0.91  CALCIUM 9.0  --  8.9  MG  --   --  1.4*  PHOS  --   --  3.2   GFR: Estimated Creatinine Clearance: 144.8 mL/min (by C-G formula based on SCr of 0.91 mg/dL). Liver Function Tests: No results for input(s): AST, ALT, ALKPHOS, BILITOT, PROT, ALBUMIN in the last 168 hours. No results for input(s): LIPASE, AMYLASE in the last 168 hours. No results for input(s): AMMONIA in the last 168 hours. Coagulation Profile: Recent Labs  Lab 08/11/20 0611  INR 1.1   Cardiac Enzymes: No results for input(s): CKTOTAL, CKMB, CKMBINDEX, TROPONINI in the last 168 hours. BNP (last 3 results) No results for input(s): PROBNP in the last 8760 hours. HbA1C: No results for input(s): HGBA1C in the last 72 hours. CBG: Recent Labs  Lab 08/11/20 0124 08/11/20 0732 08/11/20 1131  GLUCAP  323* 284* 395*   Lipid Profile: No results for input(s): CHOL, HDL, LDLCALC, TRIG, CHOLHDL, LDLDIRECT in the last 72 hours. Thyroid Function Tests: No results for input(s): TSH, T4TOTAL, FREET4, T3FREE, THYROIDAB in the last 72 hours. Anemia Panel: No results for input(s): VITAMINB12, FOLATE, FERRITIN, TIBC, IRON, RETICCTPCT in the last 72 hours. Sepsis Labs: No results for input(s): PROCALCITON, LATICACIDVEN in the last 168 hours.  Recent Results (from the past 240 hour(s))  Resp Panel by RT-PCR (Flu A&B, Covid) Nasopharyngeal Swab     Status: None   Collection Time: 08/10/20  9:22 PM   Specimen: Nasopharyngeal Swab; Nasopharyngeal(NP)  swabs in vial transport medium  Result Value Ref Range Status   SARS Coronavirus 2 by RT PCR NEGATIVE NEGATIVE Final    Comment: (NOTE) SARS-CoV-2 target nucleic acids are NOT DETECTED.  The SARS-CoV-2 RNA is generally detectable in upper respiratory specimens during the acute phase of infection. The lowest concentration of SARS-CoV-2 viral copies this assay can detect is 138 copies/mL. A negative result does not preclude SARS-Cov-2 infection and should not be used as the sole basis for treatment or other patient management decisions. A negative result may occur with  improper specimen collection/handling, submission of specimen other than nasopharyngeal swab, presence of viral mutation(s) within the areas targeted by this assay, and inadequate number of viral copies(<138 copies/mL). A negative result must be combined with clinical observations, patient history, and epidemiological information. The expected result is Negative.  Fact Sheet for Patients:  BloggerCourse.com  Fact Sheet for Healthcare Providers:  SeriousBroker.it  This test is no t yet approved or cleared by the [place] FDA and  has been authorized for detection and/or diagnosis of SARS-CoV-2 by FDA under an Emergency Use  Authorization (EUA). This EUA will remain  in effect (meaning this test can be used) for the duration of the COVID-19 declaration under Section 564(b)(1) of the Act, 21 U.S.C.section 360bbb-3(b)(1), unless the authorization is terminated  or revoked sooner.       Influenza A by PCR NEGATIVE NEGATIVE Final   Influenza B by PCR NEGATIVE NEGATIVE Final    Comment: (NOTE) The Xpert Xpress SARS-CoV-2/FLU/RSV plus assay is intended as an aid in the diagnosis of influenza from Nasopharyngeal swab specimens and should not be used as a sole basis for treatment. Nasal washings and aspirates are unacceptable for Xpert Xpress SARS-CoV-2/FLU/RSV testing.  Fact Sheet for Patients: BloggerCourse.com  Fact Sheet for Healthcare Providers: SeriousBroker.it  This test is not yet approved or cleared by the [place] FDA and has been authorized for detection and/or diagnosis of SARS-CoV-2 by FDA under an Emergency Use Authorization (EUA). This EUA will remain in effect (meaning this test can be used) for the duration of the COVID-19 declaration under Section 564(b)(1) of the Act, 21 U.S.C. section 360bbb-3(b)(1), unless the authorization is terminated or revoked.  Performed at [facility], [address] [place], [place] [address]       Radiology Studies: DG Chest 2 View  Result Date: 08/10/2020 CLINICAL DATA:  Shortness of breath, hypoxia EXAM: CHEST - 2 VIEW COMPARISON:  07/13/2017 FINDINGS: The heart size and mediastinal contours are within normal limits. Diffuse bilateral interstitial prominence. No focal airspace consolidation, pleural effusion, or pneumothorax. The visualized skeletal structures are unremarkable. IMPRESSION: Diffuse bilateral interstitial prominence, which may reflect bronchitic type lung changes versus mild interstitial edema. Electronically Signed   By: Duanne Guess D.O.   On: 08/10/2020 19:35   CT Angio  Chest PE W and/or Wo Contrast  Result Date: 08/10/2020 CLINICAL DATA:  Shortness of breath. Chest pain. Elevated D-dimer. Suspected pulmonary embolus. EXAM: CT ANGIOGRAPHY CHEST WITH CONTRAST TECHNIQUE: Multidetector CT imaging of the chest was performed using the standard protocol during bolus administration of intravenous contrast. Multiplanar CT image reconstructions and MIPs were obtained to evaluate the vascular anatomy. CONTRAST:  72mL OMNIPAQUE IOHEXOL 350 MG/ML SOLN COMPARISON:  CT angiography chest 07/13/2017 FINDINGS: Cardiovascular: Poor opacification of the pulmonary arteries to the proximal segmental level due to timing of contrast and respiratory motion artifact. Distal central right main pulmonary artery filling defect extending to the segmental and  subsegmental levels of all 3 right lobes. Segmental and subsegmental left upper lobe pulmonary artery filling defects. Cleary subsegmental left lower lobe pulmonary artery filling defects. No evidence of pulmonary embolism. Enlarged right to left ventricular ratio. No pericardial effusion. Mediastinum/Nodes: No enlarged mediastinal, hilar, or axillary lymph nodes. Thyroid gland, trachea, and esophagus demonstrate no significant findings. Lungs/Pleura: Stable subpleural left lower lobe nodules (6:87, 99) measuring 8 mm and 6 mm. Lungs are clear. No pleural effusion or pneumothorax. Upper Abdomen: Hepatic steatosis.  Otherwise no acute abnormality. Musculoskeletal: No chest wall abnormality. No acute or significant osseous findings. Review of the MIP images confirms the above findings. IMPRESSION: 1. Distal central right pulmonary embolus extending to the segmental and subsegmental levels of all three right lobes. Segmental and subsegmental left upper lobe pulmonary embolus. Associated right heart strain. No pulmonary infarction. 2. Hepatic steatosis. These results were called by telephone at the time of interpretation on 08/10/2020 at 10:11 pm to provider  Dr. Jacqulyn Bath , who verbally acknowledged these results and will relay the information to Dr. Audley Hose. Electronically Signed   By: Tish Frederickson M.D.   On: 08/10/2020 22:13      Scheduled Meds: . insulin aspart  0-5 Units Subcutaneous QHS  . insulin aspart  0-9 Units Subcutaneous TID WC  . insulin glargine  10 Units Subcutaneous Daily  . iron polysaccharides  150 mg Oral Daily   Continuous Infusions: . heparin 2,400 Units/hr (08/11/20 0844)     LOS: 1 day      Time spent: 25 minutes   Noralee Stain, DO [facility] 08/11/2020, 1:10 PM   Available via Epic secure chat 7am-7pm After these hours, please refer to coverage provider listed on [email or web address]

## 2020-08-11 NOTE — ED Notes (Signed)
Attempted report x1. 

## 2020-08-11 NOTE — Consult Note (Signed)
NAME:  Chad Rivas, MRN:  [id number], DOB:  1965/12/26, LOS: 1 ADMISSION DATE:  08/10/2020, CONSULTATION DATE:  08/10/20 REFERRING MD:  Audley Hose, CHIEF COMPLAINT:  SOB   History of Present Illness:  Chad Rivas is a 55 year old man presenting with progressive SOB.  PMX notable for DM2, OSA, morbid obesity. Hx of blood clot 15 years ago post hip surgery. Currently employed as a Merchandiser, retail. States that he has been sitting a lot lately. States that he is up to date on his colonoscopies and is having his PSA checked. Denies smoking and family history of blood clots.  Over the past 2-3 days the patient became progressively SOB. On 6/6 he presented to urgent care and was Found to be hypoxemic. He was sent to [facility] ED.  ED workup was notable for a CTA showing PE. BNP and troponin WNL. D dimer elevated in ER leading to CTA showing PE.   PCCM consulted to assist with potential RH strain.  Pertinent  Medical History  Type 2 DM, OSA, Morbid obesity  Significant Hospital Events: Including procedures, antibiotic start and stop dates in addition to other pertinent events   . 6/6 Presented to urgent care with SOB, transferred to [facility]. CTA showed PE. Heparin drip started.  Interim History / Subjective:   Patient is feeling better this morning. Shortness of breath has improved. He denies feeling light headed or dizzy. He has not ambulated since being in the ER.   I turned the oxygen off during our visit this morning and he did not desaturate below 88% at rest.   Objective   Blood pressure 130/72, pulse 79, temperature 98.1 F (36.7 C), temperature source Oral, resp. rate 19, height 5\' 10"  (1.778 m), weight (!) 160.1 kg, SpO2 90 %. 1LNC        Intake/Output Summary (Last 24 hours) at 08/11/2020 0859 Last data filed at 08/10/2020 2224 Gross per 24 hour  Intake --  Output 175 ml  Net -175 ml   Filed Weights   08/10/20 2223  Weight: (!) 160.1 kg    Examination: General:  Resting in bed, no  acute distress, obese HEENT: [place]/AT, moist mucous membranes, sclera anicteric Neuro: moving all extremities, alert and oriented x 3 CV: S1S2, NSR, no m/r/g appreciated PULM: clear to auscultation bilaterally. No wheezing or rhonchi. GI: soft, bsx4 active, nondistended Extremities: no edema, warm Skin: no rashes or lesions   Labs/imaging that I have personally reviewed  (right click and "Reselect all SmartList Selections" daily)  6/6 BNP 36.9 Trop <0.03 Ddimer 3.36  Glucose 178, 323, 284  CXR- No pneumo of effusion CTA chest- pulmonary embolus of the distal right main pulmonary artery 12 lead- ST, no ischemic changes  Resolved Hospital Problem list     Assessment & Plan:  Pulmonary Embolism - Unprovoked Acute Respiratory Failure with Hypoxia - Secondary to PE Hx OSA Hx DVT- 15 years ago CTA chest +for PE, D dimer 3.36. Trop and BNP WNL.  PESI score 62- Class 1, very low risk. -Continue heparin drip.  - Awaiting ECHO to further evaluate for RV strain - Once we have the echo results then we can consider transition to DOAC therapy. - Will follow up lower extremity duplex 2224. - Will need to ambulate prior to discharge to determine if he has supplemental oxygen needs. - CPAP at night for OSA - He will need follow up in the pulmonary clinic which we will arrange  Hx DM - per primary  team  Hepatic Steatosis On 6/6 CTA -Follow up outpatient with PCP   Labs   CBC: Recent Labs  Lab 08/10/20 1819 08/10/20 1842 08/11/20 0531  WBC 10.6*  --  10.2  NEUTROABS 6.9  --   --   HGB 13.0 15.0 12.3*  HCT 43.5 44.0 41.5  MCV 79.8*  --  80.0  PLT 345  --  299    Basic Metabolic Panel: Recent Labs  Lab 08/10/20 1819 08/10/20 1842 08/11/20 0531  NA 133* 136 133*  K 3.9 3.9 3.8  CL 98  --  96*  CO2 25  --  29  GLUCOSE 373*  --  332*  BUN 13  --  13  CREATININE 1.11  --  0.91  CALCIUM 9.0  --  8.9  MG  --   --  1.4*  PHOS  --   --  3.2   GFR: Estimated Creatinine  Clearance: 144.8 mL/min (by C-G formula based on SCr of 0.91 mg/dL). Recent Labs  Lab 08/10/20 1819 08/11/20 0531  WBC 10.6* 10.2    Liver Function Tests: No results for input(s): AST, ALT, ALKPHOS, BILITOT, PROT, ALBUMIN in the last 168 hours. No results for input(s): LIPASE, AMYLASE in the last 168 hours. No results for input(s): AMMONIA in the last 168 hours.  ABG    Component Value Date/Time   HCO3 30.5 (H) 08/10/2020 1842   TCO2 32 08/10/2020 1842   O2SAT 99.0 08/10/2020 1842     Coagulation Profile: Recent Labs  Lab 08/11/20 0611  INR 1.1    Cardiac Enzymes: No results for input(s): CKTOTAL, CKMB, CKMBINDEX, TROPONINI in the last 168 hours.  HbA1C: Hgb A1c MFr Bld  Date/Time Value Ref Range Status  07/14/2017 05:30 AM 14.2 (H) 4.8 - 5.6 % Final    Comment:    (NOTE) Pre diabetes:          5.7%-6.4% Diabetes:              >6.4% Glycemic control for   <7.0% adults with diabetes     CBG: Recent Labs  Lab 08/11/20 0124 08/11/20 0732  GLUCAP 323* 284*       Critical care time: N/A    Melody Comas, MD [facility] Office: [phone]   See Amion for personal pager PCCM on call pager [phone] until 7pm. Please call Elink 7p-7a. [phone]

## 2020-08-12 ENCOUNTER — Encounter (HOSPITAL_COMMUNITY): Payer: Self-pay | Admitting: Internal Medicine

## 2020-08-12 ENCOUNTER — Telehealth: Payer: Self-pay | Admitting: Pulmonary Disease

## 2020-08-12 DIAGNOSIS — E1165 Type 2 diabetes mellitus with hyperglycemia: Secondary | ICD-10-CM

## 2020-08-12 DIAGNOSIS — I2692 Saddle embolus of pulmonary artery without acute cor pulmonale: Secondary | ICD-10-CM

## 2020-08-12 DIAGNOSIS — J9601 Acute respiratory failure with hypoxia: Secondary | ICD-10-CM

## 2020-08-12 LAB — GLUCOSE, CAPILLARY
Glucose-Capillary: 267 mg/dL — ABNORMAL HIGH (ref 70–99)
Glucose-Capillary: 263 mg/dL — ABNORMAL HIGH (ref 70–99)
Glucose-Capillary: 261 mg/dL — ABNORMAL HIGH (ref 70–99)
Glucose-Capillary: 220 mg/dL — ABNORMAL HIGH (ref 70–99)

## 2020-08-12 LAB — BASIC METABOLIC PANEL
Anion gap: 10 (ref 5–15)
BUN: 12 mg/dL (ref 6–20)
CO2: 28 mmol/L (ref 22–32)
Calcium: 8.6 mg/dL — ABNORMAL LOW (ref 8.9–10.3)
Chloride: 97 mmol/L — ABNORMAL LOW (ref 98–111)
Creatinine, Ser: 0.91 mg/dL (ref 0.61–1.24)
GFR, Estimated: >60 mL/min (ref >60)
Glucose, Bld: 298 mg/dL — ABNORMAL HIGH (ref 70–99)
Potassium: 3.7 mmol/L (ref 3.5–5.1)
Sodium: 135 mmol/L (ref 135–145)

## 2020-08-12 LAB — CBC
HCT: 40.9 % (ref 39.0–52.0)
Hemoglobin: 12.2 g/dL — ABNORMAL LOW (ref 13.0–17.0)
MCH: 23.8 pg — ABNORMAL LOW (ref 26.0–34.0)
MCHC: 29.8 g/dL — ABNORMAL LOW (ref 30.0–36.0)
MCV: 79.7 fL — ABNORMAL LOW (ref 80.0–100.0)
Platelets: 289 10*3/uL (ref 150–400)
RBC: 5.13 MIL/uL (ref 4.22–5.81)
RDW: 14.9 % (ref 11.5–15.5)
WBC: 10 10*3/uL (ref 4.0–10.5)
nRBC: 0 % (ref 0.0–0.2)

## 2020-08-12 LAB — PROTIME-INR
INR: 1.1 (ref 0.8–1.2)
Prothrombin Time: 14.1 seconds (ref 11.4–15.2)

## 2020-08-12 LAB — HEMOGLOBIN A1C
Hgb A1c MFr Bld: 11.8 % — ABNORMAL HIGH (ref 4.8–5.6)
Mean Plasma Glucose: 292 mg/dL

## 2020-08-12 LAB — HEPARIN LEVEL (UNFRACTIONATED): Heparin Unfractionated: 0.21 IU/mL — ABNORMAL LOW (ref 0.30–0.70)

## 2020-08-12 MED ORDER — ATORVASTATIN CALCIUM 40 MG PO TABS
40.0000 mg | ORAL_TABLET | Freq: Every day | ORAL | Status: DC
  Administered 2020-08-12 – 2020-08-13 (×2): 40 mg via ORAL
  Filled 2020-08-12 (×2): qty 1

## 2020-08-12 MED ORDER — INSULIN STARTER KIT- PEN NEEDLES (ENGLISH)
1.0000 | Freq: Once | Status: AC
  Administered 2020-08-12: 1
  Filled 2020-08-12: qty 1

## 2020-08-12 MED ORDER — INSULIN GLARGINE 100 UNIT/ML ~~LOC~~ SOLN
30.0000 [IU] | Freq: Every day | SUBCUTANEOUS | Status: DC
  Administered 2020-08-13: 30 [IU] via SUBCUTANEOUS
  Filled 2020-08-12: qty 0.3

## 2020-08-12 MED ORDER — CARVEDILOL 3.125 MG PO TABS
3.1250 mg | ORAL_TABLET | Freq: Two times a day (BID) | ORAL | Status: DC
  Administered 2020-08-13: 3.125 mg via ORAL
  Filled 2020-08-12 (×2): qty 1

## 2020-08-12 MED ORDER — INSULIN GLARGINE 100 UNIT/ML ~~LOC~~ SOLN
15.0000 [IU] | Freq: Once | SUBCUTANEOUS | Status: AC
  Administered 2020-08-12: 15 [IU] via SUBCUTANEOUS
  Filled 2020-08-12: qty 0.15

## 2020-08-12 MED ORDER — HEPARIN BOLUS VIA INFUSION
4000.0000 [IU] | Freq: Once | INTRAVENOUS | Status: AC
  Administered 2020-08-12: 4000 [IU] via INTRAVENOUS
  Filled 2020-08-12: qty 4000

## 2020-08-12 MED ORDER — APIXABAN 5 MG PO TABS
10.0000 mg | ORAL_TABLET | Freq: Two times a day (BID) | ORAL | Status: DC
  Administered 2020-08-12 – 2020-08-13 (×3): 10 mg via ORAL
  Filled 2020-08-12 (×3): qty 2

## 2020-08-12 MED ORDER — APIXABAN 5 MG PO TABS: 5.0000 mg | ORAL_TABLET | Freq: Two times a day (BID) | ORAL | Status: DC

## 2020-08-12 MED ORDER — LIVING WELL WITH DIABETES BOOK
Freq: Once | Status: AC
  Filled 2020-08-12: qty 1

## 2020-08-12 NOTE — Progress Notes (Signed)
ANTICOAGULATION CONSULT NOTE  Pharmacy Consult for Heparin Indication: pulmonary embolus  Allergies  Allergen Reactions  . Penicillins Anaphylaxis and Other (See Comments)    Has patient had a PCN reaction causing immediate rash, facial/tongue/throat swelling, SOB or lightheadedness with hypotension: Y Has patient had a PCN reaction causing severe rash involving mucus membranes or skin necrosis: Y Has patient had a PCN reaction that required hospitalization: Y Has patient had a PCN reaction occurring within the last 10 years: N If all of the above answers are "NO", then may proceed with Cephalosporin use.   . Losartan Other (See Comments)    Cause angioedema    Patient Measurements: Height: 5\' 10"  (177.8 cm) Weight: (!) 160.1 kg (353 lb) IBW/kg (Calculated) : 73  Heparin Dosing Weight: 111.9 kg  Vital Signs: Temp: 98.6 F (37 C) (06/07 2330) Temp Source: Axillary (06/07 2330) BP: 164/95 (06/07 2330) Pulse Rate: 80 (06/07 2330)  Labs: Recent Labs    08/10/20 1819 08/10/20 1842 08/10/20 2121 08/11/20 0013 08/11/20 0531 08/11/20 0611 08/11/20 1600 08/12/20 0033  HGB 13.0 15.0  --   --  12.3*  --   --  12.2*  HCT 43.5 44.0  --   --  41.5  --   --  40.9  PLT 345  --   --   --  299  --   --  289  APTT  --   --   --   --   --  35  --   --   LABPROT  --   --   --   --   --  13.7  --  14.1  INR  --   --   --   --   --  1.1  --  1.1  HEPARINUNFRC  --   --   --   --   --  <0.10* 0.21* 0.21*  CREATININE 1.11  --   --   --  0.91  --   --  0.91  TROPONINIHS 11  --  9 10  --   --   --   --     Estimated Creatinine Clearance: 144.8 mL/min (by C-G formula based on SCr of 0.91 mg/dL).   Assessment: 55 y.o. male with PE for heparin  Goal of Therapy:  Heparin level 0.3-0.7 units/ml Monitor platelets by anticoagulation protocol: Yes   Plan:  Heparin 4000 units IV bolus, then increase heparin  3000 units/hr Check heparin level in 6 hours.   44, PharmD, BCPS   08/12/2020 3:12 AM

## 2020-08-12 NOTE — Telephone Encounter (Signed)
Please schedule hospital follow up with me in 4-6 weeks for pulmonary embolus.   Thank you, Cletis Athens

## 2020-08-12 NOTE — Progress Notes (Signed)
NAME:  Chad Rivas, MRN:  [id number], DOB:  Oct 28, 1965, LOS: 2 ADMISSION DATE:  08/10/2020, CONSULTATION DATE:  08/10/20 REFERRING MD:  Audley Hose, CHIEF COMPLAINT:  SOB   History of Present Illness:  Chad Rivas is a 55 year old man presenting with progressive SOB.  PMX notable for DM2, OSA, morbid obesity. Hx of blood clot 15 years ago post hip surgery. Currently employed as a Merchandiser, retail. States that he has been sitting a lot lately. States that he is up to date on his colonoscopies and is having his PSA checked. Denies smoking and family history of blood clots.  Over the past 2-3 days the patient became progressively SOB. On 6/6 he presented to urgent care and was Found to be hypoxemic. He was sent to the [facility] ED.  ED workup was notable for a CTA showing PE. BNP and troponin WNL. D dimer elevated in ER leading to CTA showing PE.   PCCM consulted to assist with potential RH strain.  Pertinent  Medical History  Type 2 DM, OSA, Morbid obesity  Significant Hospital Events: Including procedures, antibiotic start and stop dates in addition to other pertinent events   . 6/6 Presented to urgent care with SOB, transferred to [facility]. CTA showed PE. Heparin drip started. . 6/8 transitioned to eliquis therapy  Interim History / Subjective:   Patient is feeling better this morning. He is sitting up in chair this morning. Denies hemoptysis.   He has been transitioned to eliquis this morning.   He refused to use CPAP overnight.   Objective   Blood pressure 138/82, pulse 85, temperature 98 F (36.7 C), temperature source Oral, resp. rate 15, height 5\' 10"  (1.778 m), weight (!) 160.1 kg, SpO2 92 %. 1LNC        Intake/Output Summary (Last 24 hours) at 08/12/2020 0931 Last data filed at 08/11/2020 1900 Gross per 24 hour  Intake 551.93 ml  Output 200 ml  Net 351.93 ml   Filed Weights   08/10/20 2223  Weight: (!) 160.1 kg    Examination: General:  Sitting up in chair, no acute distress,  obese HEENT: [place]/AT, moist mucous membranes, sclera anicteric Neuro: moving all extremities, alert and oriented x 3 CV: S1S2, NSR, no m/r/g appreciated PULM: clear to auscultation bilaterally. No wheezing or rhonchi. GI: soft, bsx4 active, nondistended Extremities: no edema, warm Skin: no rashes or lesions   Labs/imaging that I have personally reviewed  (right click and "Reselect all SmartList Selections" daily)  6/6 BNP 36.9 Trop <0.03 Ddimer 3.36  Glucose 178, 323, 284  CXR- No pneumo of effusion CTA chest- pulmonary embolus of the distal right main pulmonary artery 12 lead- ST, no ischemic changes  ECHO: LV EF 40-45%. Mild concentric LV hypertrophy. Grade II diastolic dysfunction. Interventricular septum is flattened in systole and diastole. RV systolic function is normal. RV size is moderately enlarged.   LE Duplex 2224: acute VT of left peroneal veins  Resolved Hospital Problem list     Assessment & Plan:  Pulmonary Embolism - Unprovoked Acute DVT of Left lower extremity Acute Respiratory Failure with Hypoxia - Secondary to PE Hx OSA Hx DVT- 15 years ago CTA chest +for PE, D dimer 3.36. Trop and BNP WNL.  PESI score 62- Class 1, very low risk. - He has been transitioned from heparin drip to Eliquis this morning for the DVT/PE - PT to evaluate the patient today and monitor his ambulatory O2 needs. - CPAP at night for OSA. He is  to follow up with his sleep specialist about getting his CPAP machine as he never went back for follow up due to the pandemic. - He will need follow up in the pulmonary clinic which we will arrange  Will follow up tomorrow. If patient does well today, then we will plan for discharge tomorrow.   Labs   CBC: Recent Labs  Lab 08/10/20 1819 08/10/20 1842 08/11/20 0531 08/12/20 0033  WBC 10.6*  --  10.2 10.0  NEUTROABS 6.9  --   --   --   HGB 13.0 15.0 12.3* 12.2*  HCT 43.5 44.0 41.5 40.9  MCV 79.8*  --  80.0 79.7*  PLT 345  --  299 289     Basic Metabolic Panel: Recent Labs  Lab 08/10/20 1819 08/10/20 1842 08/11/20 0531 08/12/20 0033  NA 133* 136 133* 135  K 3.9 3.9 3.8 3.7  CL 98  --  96* 97*  CO2 25  --  29 28  GLUCOSE 373*  --  332* 298*  BUN 13  --  13 12  CREATININE 1.11  --  0.91 0.91  CALCIUM 9.0  --  8.9 8.6*  MG  --   --  1.4*  --   PHOS  --   --  3.2  --    GFR: Estimated Creatinine Clearance: 144.8 mL/min (by C-G formula based on SCr of 0.91 mg/dL). Recent Labs  Lab 08/10/20 1819 08/11/20 0531 08/12/20 0033  WBC 10.6* 10.2 10.0    Liver Function Tests: No results for input(s): AST, ALT, ALKPHOS, BILITOT, PROT, ALBUMIN in the last 168 hours. No results for input(s): LIPASE, AMYLASE in the last 168 hours. No results for input(s): AMMONIA in the last 168 hours.  ABG    Component Value Date/Time   HCO3 30.5 (H) 08/10/2020 1842   TCO2 32 08/10/2020 1842   O2SAT 99.0 08/10/2020 1842     Coagulation Profile: Recent Labs  Lab 08/11/20 0611 08/12/20 0033  INR 1.1 1.1    Cardiac Enzymes: No results for input(s): CKTOTAL, CKMB, CKMBINDEX, TROPONINI in the last 168 hours.  HbA1C: Hgb A1c MFr Bld  Date/Time Value Ref Range Status  08/11/2020 05:31 AM 11.8 (H) 4.8 - 5.6 % Final    Comment:    (NOTE)         Prediabetes: 5.7 - 6.4         Diabetes: >6.4         Glycemic control for adults with diabetes: <7.0   07/14/2017 05:30 AM 14.2 (H) 4.8 - 5.6 % Final    Comment:    (NOTE) Pre diabetes:          5.7%-6.4% Diabetes:              >6.4% Glycemic control for   <7.0% adults with diabetes     CBG: Recent Labs  Lab 08/11/20 0732 08/11/20 1131 08/11/20 1619 08/11/20 2218 08/12/20 0749  GLUCAP 284* 395* 334* 325* 267*       Critical care time: N/A    Melody Comas, MD [facility] Office: [phone]   See Amion for personal pager PCCM on call pager [phone] until 7pm. Please call Elink 7p-7a. [phone]

## 2020-08-12 NOTE — TOC Initial Note (Signed)
Transition of Care [facility]) - Initial/Assessment Note    Patient Details  Name: Chad Rivas MRN: [id number] Date of Birth: [birth year]  Transition of Care [facility]) CM/SW Contact:    Durenda Guthrie, RN Phone Number: 08/12/2020, 2:17 PM  Clinical Narrative:  Case manager spoke with patient concerning need for oxygen at discharge. Referral called to [facility] with [employer].  Saturations have been documented.No further needs identified.                Expected Discharge Plan: Home/Self Care Barriers to Discharge: No Barriers Identified   Patient Goals and CMS Choice        Expected Discharge Plan and Services Expected Discharge Plan: Home/Self Care   Discharge Planning Services: CM Consult Post Acute Care Choice: Durable Medical Equipment Living arrangements for the past 2 months: Single Family Home                 DME Arranged: Oxygen DME Agency: [employer] Date DME Agency Contacted: 08/12/20 Time DME Agency Contacted: 1410   HH Arranged: NA HH Agency: NA        Prior Living Arrangements/Services Living arrangements for the past 2 months: Single Family Home Lives with:: Spouse   Do you feel safe going back to the place where you live?: Yes      Need for Family Participation in Patient Care: Yes (Comment) Care giver support system in place?: Yes (comment)   Criminal Activity/Legal Involvement Pertinent to Current Situation/Hospitalization: No - Comment as needed  Activities of Daily Living Home Assistive Devices/Equipment: None ADL Screening (condition at time of admission) Patient's cognitive ability adequate to safely complete daily activities?: Yes Is the patient deaf or have difficulty hearing?: No Does the patient have difficulty seeing, even when wearing glasses/contacts?: No Does the patient have difficulty concentrating, remembering, or making decisions?: No Patient able to express need for assistance with ADLs?: Yes Does the patient have difficulty dressing or  bathing?: No Independently performs ADLs?: Yes (appropriate for developmental age) Does the patient have difficulty walking or climbing stairs?: No Weakness of Legs: None Weakness of Arms/Hands: None  Permission Sought/Granted         Permission granted to share info w AGENCY: [employer]        Emotional Assessment       Orientation: : Oriented to Self,Oriented to Place,Oriented to  Time,Oriented to Situation Alcohol / Substance Use: Not Applicable Psych Involvement: No (comment)  Admission diagnosis:  Acute pulmonary embolism (HCC) [I26.99] Other acute pulmonary embolism, unspecified whether acute cor pulmonale present (HCC) [I26.99] Patient Active Problem List   Diagnosis Date Noted  . Acute hypoxemic respiratory failure (HCC) 08/11/2020  . Acute pulmonary embolism (HCC) 08/10/2020  . Abscess of groin, left 07/13/2017  . Type 2 diabetes mellitus with hyperglycemia, without long-term current use of insulin (HCC) 07/13/2017  . Morbidly obese (HCC) 07/13/2017   PCP:  Marden Noble, MD Pharmacy:   [facility] [address] [place] - [address] [place] [address] Phone: [phone] Fax: [phone]  [facility] [address] [place] [address] Phone: [phone] Fax: [phone]     Social Determinants of Health (SDOH) Interventions    Readmission Risk Interventions No flowsheet data found.

## 2020-08-12 NOTE — Evaluation (Signed)
Physical Therapy Evaluation Patient Details Name: EDAN JUDAY MRN: [id number] DOB: 24-Mar-1965 Today's Date: 08/12/2020   History of Present Illness  55yo male admitted 08/10/20 with c/o increasing SOB and chest discomfort over the past 2 days. Hypoxic to 80% on room air.  Found to have acute PE with R heart strain as well as acute hypoxic respiratory failure secondary to PE and acute DVT. PMH DM, hip replacement, obesity, toe surgery  Clinical Impression   Patient received in recliner, very pleasant and motivated to work with therapy. Able to mobilize on an independent basis with no device and reports he is at his baseline. Did require 2LPM to maintain SpO2 >92% (see ambulatory sat note for details). Left sitting at EOB with all needs met, family present and RN aware of patient status. No needs for skilled PT f/u. Signing off, thank you for the referral!     Follow Up Recommendations No PT follow up    Equipment Recommendations  None recommended by PT    Recommendations for Other Services       Precautions / Restrictions Precautions Precautions: Other (comment) Precaution Comments: watch sats Restrictions Weight Bearing Restrictions: No      Mobility  Bed Mobility               General bed mobility comments: OOB in recliner    Transfers Overall transfer level: Independent Equipment used: None             General transfer comment: at baseline, no physical assist or cues needed  Ambulation/Gait Ambulation/Gait assistance: Independent Gait Distance (Feet): 200 Feet Assistive device: None Gait Pattern/deviations: WFL(Within Functional Limits);Step-through pattern Gait velocity: WNL   General Gait Details: WNL, no significant gait deviations  Stairs            Wheelchair Mobility    Modified Rankin (Stroke Patients Only)       Balance Overall balance assessment: Independent                                           Pertinent  Vitals/Pain Pain Assessment: No/denies pain    Home Living Family/patient expects to be discharged to:: Private residence Living Arrangements: Spouse/significant other Available Help at Discharge: Family;Available PRN/intermittently Type of Home: House Home Access: Stairs to enter   [employer] of Steps: 3-4 with L ascending rail Home Layout: One level Home Equipment: Cane - single point;[place] - 2 wheels Additional Comments: DME in storage shed, not using it (had it for hip replacement surgeries)    Prior Function Level of Independence: Independent         Comments: had a fall recently- "I got distracted and missed a step leaving the house"     Hand Dominance        Extremity/Trunk Assessment   Upper Extremity Assessment Upper Extremity Assessment: Overall WFL for tasks assessed    Lower Extremity Assessment Lower Extremity Assessment: Overall WFL for tasks assessed    Cervical / Trunk Assessment Cervical / Trunk Assessment: Normal  Communication   Communication: No difficulties  Cognition Arousal/Alertness: Awake/alert Behavior During Therapy: WFL for tasks assessed/performed Overall Cognitive Status: Within Functional Limits for tasks assessed  General Comments General comments (skin integrity, edema, etc.): desaturates with activity on room air- see amb sat note for details    Exercises     Assessment/Plan    PT Assessment Patient needs continued PT services  PT Problem List Decreased strength;Cardiopulmonary status limiting activity       PT Treatment Interventions Gait training;Stair training;Patient/family education    PT Goals (Current goals can be found in the Care Plan section)  Acute Rehab PT Goals Patient Stated Goal: go home PT Goal Formulation: With patient/family Time For Goal Achievement: 08/26/20 Potential to Achieve Goals: Good    Frequency Other (Comment) (eval  only)   Barriers to discharge        Co-evaluation               AM-PAC PT "6 Clicks" Mobility  Outcome Measure Help needed turning from your back to your side while in a flat bed without using bedrails?: None Help needed moving from lying on your back to sitting on the side of a flat bed without using bedrails?: None Help needed moving to and from a bed to a chair (including a wheelchair)?: None Help needed standing up from a chair using your arms (e.g., wheelchair or bedside chair)?: None Help needed to walk in hospital room?: None Help needed climbing 3-5 steps with a railing? : A Little 6 Click Score: 23    End of Session Equipment Utilized During Treatment: Oxygen Activity Tolerance: Patient tolerated treatment well Patient left: in bed;with call bell/phone within reach;with family/visitor present (sitting at EOB per his request) Nurse Communication: Mobility status;Other (comment) (O2 drop on RA) PT Visit Diagnosis: Muscle weakness (generalized) (M62.81)    Time: 5909-3112 PT Time Calculation (min) (ACUTE ONLY): 16 min   Charges:   PT Evaluation $PT Eval Moderate Complexity: 1 Mod          Windell Norfolk, DPT, PN1   Supplemental Physical Therapist [place]    Pager [phone] Acute Rehab Office [phone]

## 2020-08-12 NOTE — Progress Notes (Signed)
Please see in addition to PT note:   SATURATION QUALIFICATIONS: (This note is used to comply with regulatory documentation for home oxygen)  Patient Saturations on Room Air at Rest = 91%  Patient Saturations on Room Air while Ambulating = 86%  Patient Saturations on 2 Liters of oxygen while Ambulating = 94%  Please briefly explain why patient needs home oxygen:oxygen desaturation with activity on room air   Madelaine Etienne, DPT, PN1   Supplemental Physical Therapist [facility]    Pager [phone] Acute Rehab Office [phone]

## 2020-08-12 NOTE — Progress Notes (Signed)
PROGRESS NOTE                                                                                                                                                                                                             Patient Demographics:    Chad Rivas, is a 55 y.o. male, DOB - 09/13/1965, ZOX:[id number]  Outpatient Primary MD for the patient is Marden Noble, MD    LOS - 2  Admit date - 08/10/2020    No chief complaint on file.      Brief Narrative (HPI from H&P)  - Chad Rivas is a 55 year old man presenting with progressive SOB.  PMX notable for DM2, OSA, morbid obesity. Hx of blood clot 15 years ago post hip surgery. Currently employed as a Merchandiser, retail. States that he has been sitting a lot lately. States that he is up to date on his colonoscopies and is having his PSA checked. Denies smoking and family history of blood clots.  Over the past 2-3 days the patient became progressively SOB. On 6/6 he presented to urgent care and was Found to be hypoxemic. He was sent to the [facility] ED.  ED workup was notable for a CTA showing PE. BNP and troponin WNL. D dimer elevated in ER leading to CTA showing PE.     Subjective:    [name] today has, No headache, No chest pain, No abdominal pain - No Nausea, No new weakness tingling or numbness, no SOB.   Assessment  & Plan :      1. Acute Hypoxic Resp. Failure due to Acute PE + L.Leg DVT - he has had a history of DVT in the past, he was initially in ICU on IV heparin now transition to Eliquis, advance activity, if no further complication likely discharge tomorrow on home Eliquis.  Will now most likely require lifelong anticoagulation.  Encouraged the patient to sit up in chair in the daytime use I-S and flutter valve for pulmonary toiletry.  Will advance activity and titrate down oxygen as possible.      SpO2: 92 % O2 Flow Rate (L/min): 3 L/min  Recent Labs  Lab  08/10/20 1819 08/10/20 1842 08/10/20 2122 08/11/20 0531 08/11/20 0611 08/12/20 0033  WBC 10.6*  --   --  10.2  --  10.0  HGB 13.0 15.0  --  12.3*  --  12.2*  HCT 43.5 44.0  --  41.5  --  40.9  PLT 345  --   --  299  --  289  BNP 26.9  --   --   --   --   --   DDIMER 3.36*  --   --   --   --   --   INR  --   --   --   --  1.1 1.1  SARSCOV2NAA  --   --  NEGATIVE  --   --   --     2.  Morbid obesity with BMI of 50 and OSA.  Nighttime CPAP follow-up with PCP for weight loss.  3.  Chronic diastolic CHF along with mild systolic dysfunction of EF 45%.  No acute issues but will have him follow-up with cardiology outpatient, will place him on Coreg and statin for now for secondary prevention.   4.  DM type II.  On Lantus and sliding scale Lantus dose increased on 08/12/2020 and diabetic insulin education also ordered  Lab Results  Component Value Date   HGBA1C 11.8 (H) 08/11/2020   CBG (last 3)  Recent Labs    08/11/20 1619 08/11/20 2218 08/12/20 0749  GLUCAP 334* 325* 267*        Condition - Fair  Family Communication  :  Wife pt's phone 08/12/20  Code Status :  Full  Consults  :  PCCM  PUD Prophylaxis : None   Procedures  :     CTA -   1. Distal central right pulmonary embolus extending to the segmental and subsegmental levels of all three right lobes. Segmental and subsegmental left upper lobe pulmonary embolus. Associated right heart strain. No pulmonary infarction. 2. Hepatic steatosis.  Leg [place] - L. Leg DVT  TTE -  1. Left ventricular ejection fraction, by estimation, is 40 to 45%. The left ventricle has mildly decreased function. The left ventricle has no regional wall motion abnormalities. There is mild concentric left ventricular hypertrophy. Left ventricular diastolic parameters are consistent with Grade II diastolic dysfunction (pseudonormalization). Elevated left atrial pressure. There is the interventricular septum is flattened in systole and diastole, consistent  with right ventricular pressure and volume  overload.  2. Right ventricular systolic function is normal. The right ventricular size is moderately enlarged.  3. The mitral valve is normal in structure. No evidence of mitral valve regurgitation. No evidence of mitral stenosis.  4. The aortic valve is tricuspid. Aortic valve regurgitation is not visualized. No aortic stenosis is present.  5. The inferior vena cava is normal in size with greater than 50% respiratory variability, suggesting right atrial pressure of 3 mmHg.      Disposition Plan  :    Status is: Inpatient  Remains inpatient appropriate because:Unsafe d/c plan   Dispo:  Patient From: Home  Planned Disposition: Home  Medically stable for discharge: No    DVT Prophylaxis  :     apixaban (ELIQUIS) tablet 10 mg  apixaban (ELIQUIS) tablet 5 mg      Lab Results  Component Value Date   PLT 289 08/12/2020    Diet :  Diet Order            Diet Carb Modified Fluid consistency: Thin; Room service appropriate? Yes  Diet effective now                  Inpatient Medications  Scheduled Meds: . apixaban  10  mg Oral BID   Followed by  . [START ON 08/19/2020] apixaban  5 mg Oral BID  . insulin aspart  0-5 Units Subcutaneous QHS  . insulin aspart  0-9 Units Subcutaneous TID WC  . insulin aspart  5 Units Subcutaneous TID WC  . insulin glargine  15 Units Subcutaneous Daily  . iron polysaccharides  150 mg Oral Daily   Continuous Infusions: PRN Meds:.acetaminophen, guaiFENesin-dextromethorphan, melatonin, ondansetron (ZOFRAN) IV  Antibiotics  :    Anti-infectives (From admission, onward)   None       Time Spent in minutes  30   Susa RaringPrashant Arah Aro M.D on 08/12/2020 at 11:11 AM  To page go to [email or web address]   [facility] -  Office  [phone]    See all Orders from today for further details    Objective:   Vitals:   08/11/20 2112 08/11/20 2330 08/12/20 0503 08/12/20 0747  BP: (!) 141/84 (!) 164/95 (!)  153/85 138/82  Pulse: 87 80 85   Resp: 19 17 15 15   Temp: 98.9 F (37.2 C) 98.6 F (37 C) 98.9 F (37.2 C) 98 F (36.7 C)  TempSrc: Axillary Axillary Axillary Oral  SpO2: 93% 90% 93% 92%  Weight:      Height:        Wt Readings from Last 3 Encounters:  08/10/20 (!) 160.1 kg  03/30/20 (!) 157.3 kg  06/05/19 (!) 157.7 kg     Intake/Output Summary (Last 24 hours) at 08/12/2020 1111 Last data filed at 08/12/2020 0804 Gross per 24 hour  Intake 551.93 ml  Output 475 ml  Net 76.93 ml     Physical Exam  Awake Alert, No new F.N deficits, Normal affect Roland.AT,PERRAL Supple Neck,No JVD, No cervical lymphadenopathy appriciated.  Symmetrical Chest wall movement, Good air movement bilaterally, CTAB RRR,No Gallops,Rubs or new Murmurs, No Parasternal Heave +ve B.Sounds, Abd Soft, No tenderness, No organomegaly appriciated, No rebound - guarding or rigidity. No Cyanosis, Clubbing or edema, No new Rash or bruise       Data Review:    CBC Recent Labs  Lab 08/10/20 1819 08/10/20 1842 08/11/20 0531 08/12/20 0033  WBC 10.6*  --  10.2 10.0  HGB 13.0 15.0 12.3* 12.2*  HCT 43.5 44.0 41.5 40.9  PLT 345  --  299 289  MCV 79.8*  --  80.0 79.7*  MCH 23.9*  --  23.7* 23.8*  MCHC 29.9*  --  29.6* 29.8*  RDW 15.0  --  15.2 14.9  LYMPHSABS 2.7  --   --   --   MONOABS 0.6  --   --   --   EOSABS 0.3  --   --   --   BASOSABS 0.0  --   --   --     Recent Labs  Lab 08/10/20 1819 08/10/20 1842 08/11/20 0531 08/11/20 0611 08/12/20 0033  NA 133* 136 133*  --  135  K 3.9 3.9 3.8  --  3.7  CL 98  --  96*  --  97*  CO2 25  --  29  --  28  GLUCOSE 373*  --  332*  --  298*  BUN 13  --  13  --  12  CREATININE 1.11  --  0.91  --  0.91  CALCIUM 9.0  --  8.9  --  8.6*  MG  --   --  1.4*  --   --   DDIMER 3.36*  --   --   --   --  INR  --   --   --  1.1 1.1  HGBA1C  --   --  11.8*  --   --   BNP 26.9  --   --   --   --      ------------------------------------------------------------------------------------------------------------------ No results for input(s): CHOL, HDL, LDLCALC, TRIG, CHOLHDL, LDLDIRECT in the last 72 hours.  Lab Results  Component Value Date   HGBA1C 11.8 (H) 08/11/2020   ------------------------------------------------------------------------------------------------------------------ No results for input(s): TSH, T4TOTAL, T3FREE, THYROIDAB in the last 72 hours.  Invalid input(s): FREET3  Cardiac Enzymes No results for input(s): CKMB, TROPONINI, MYOGLOBIN in the last 168 hours.  Invalid input(s): CK ------------------------------------------------------------------------------------------------------------------    Component Value Date/Time   BNP 26.9 08/10/2020 1819    Micro Results Recent Results (from the past 240 hour(s))  Resp Panel by RT-PCR (Flu A&B, Covid) Nasopharyngeal Swab     Status: None   Collection Time: 08/10/20  9:22 PM   Specimen: Nasopharyngeal Swab; Nasopharyngeal(NP) swabs in vial transport medium  Result Value Ref Range Status   SARS Coronavirus 2 by RT PCR NEGATIVE NEGATIVE Final    Comment: (NOTE) SARS-CoV-2 target nucleic acids are NOT DETECTED.  The SARS-CoV-2 RNA is generally detectable in upper respiratory specimens during the acute phase of infection. The lowest concentration of SARS-CoV-2 viral copies this assay can detect is 138 copies/mL. A negative result does not preclude SARS-Cov-2 infection and should not be used as the sole basis for treatment or other patient management decisions. A negative result may occur with  improper specimen collection/handling, submission of specimen other than nasopharyngeal swab, presence of viral mutation(s) within the areas targeted by this assay, and inadequate number of viral copies(<138 copies/mL). A negative result must be combined with clinical observations, patient history, and  epidemiological information. The expected result is Negative.  Fact Sheet for Patients:  BloggerCourse.com  Fact Sheet for Healthcare Providers:  SeriousBroker.it  This test is no t yet approved or cleared by the [place] FDA and  has been authorized for detection and/or diagnosis of SARS-CoV-2 by FDA under an Emergency Use Authorization (EUA). This EUA will remain  in effect (meaning this test can be used) for the duration of the COVID-19 declaration under Section 564(b)(1) of the Act, 21 U.S.C.section 360bbb-3(b)(1), unless the authorization is terminated  or revoked sooner.       Influenza A by PCR NEGATIVE NEGATIVE Final   Influenza B by PCR NEGATIVE NEGATIVE Final    Comment: (NOTE) The Xpert Xpress SARS-CoV-2/FLU/RSV plus assay is intended as an aid in the diagnosis of influenza from Nasopharyngeal swab specimens and should not be used as a sole basis for treatment. Nasal washings and aspirates are unacceptable for Xpert Xpress SARS-CoV-2/FLU/RSV testing.  Fact Sheet for Patients: BloggerCourse.com  Fact Sheet for Healthcare Providers: SeriousBroker.it  This test is not yet approved or cleared by the [place] FDA and has been authorized for detection and/or diagnosis of SARS-CoV-2 by FDA under an Emergency Use Authorization (EUA). This EUA will remain in effect (meaning this test can be used) for the duration of the COVID-19 declaration under Section 564(b)(1) of the Act, 21 U.S.C. section 360bbb-3(b)(1), unless the authorization is terminated or revoked.  Performed at [facility], [address] [place], [place] [address]     Radiology Reports DG Chest 2 View  Result Date: 08/10/2020 CLINICAL DATA:  Shortness of breath, hypoxia EXAM: CHEST - 2 VIEW COMPARISON:  07/13/2017 FINDINGS: The heart size and mediastinal contours  are within normal limits.  Diffuse bilateral interstitial prominence. No focal airspace consolidation, pleural effusion, or pneumothorax. The visualized skeletal structures are unremarkable. IMPRESSION: Diffuse bilateral interstitial prominence, which may reflect bronchitic type lung changes versus mild interstitial edema. Electronically Signed   By: Duanne Guess D.O.   On: 08/10/2020 19:35   CT Angio Chest PE W and/or Wo Contrast  Result Date: 08/10/2020 CLINICAL DATA:  Shortness of breath. Chest pain. Elevated D-dimer. Suspected pulmonary embolus. EXAM: CT ANGIOGRAPHY CHEST WITH CONTRAST TECHNIQUE: Multidetector CT imaging of the chest was performed using the standard protocol during bolus administration of intravenous contrast. Multiplanar CT image reconstructions and MIPs were obtained to evaluate the vascular anatomy. CONTRAST:  50mL OMNIPAQUE IOHEXOL 350 MG/ML SOLN COMPARISON:  CT angiography chest 07/13/2017 FINDINGS: Cardiovascular: Poor opacification of the pulmonary arteries to the proximal segmental level due to timing of contrast and respiratory motion artifact. Distal central right main pulmonary artery filling defect extending to the segmental and subsegmental levels of all 3 right lobes. Segmental and subsegmental left upper lobe pulmonary artery filling defects. Cleary subsegmental left lower lobe pulmonary artery filling defects. No evidence of pulmonary embolism. Enlarged right to left ventricular ratio. No pericardial effusion. Mediastinum/Nodes: No enlarged mediastinal, hilar, or axillary lymph nodes. Thyroid gland, trachea, and esophagus demonstrate no significant findings. Lungs/Pleura: Stable subpleural left lower lobe nodules (6:87, 99) measuring 8 mm and 6 mm. Lungs are clear. No pleural effusion or pneumothorax. Upper Abdomen: Hepatic steatosis.  Otherwise no acute abnormality. Musculoskeletal: No chest wall abnormality. No acute or significant osseous findings. Review of the MIP images confirms the above  findings. IMPRESSION: 1. Distal central right pulmonary embolus extending to the segmental and subsegmental levels of all three right lobes. Segmental and subsegmental left upper lobe pulmonary embolus. Associated right heart strain. No pulmonary infarction. 2. Hepatic steatosis. These results were called by telephone at the time of interpretation on 08/10/2020 at 10:11 pm to provider Dr. Jacqulyn Bath , who verbally acknowledged these results and will relay the information to Dr. Audley Hose. Electronically Signed   By: Tish Frederickson M.D.   On: 08/10/2020 22:13   ECHOCARDIOGRAM COMPLETE  Result Date: 08/11/2020    ECHOCARDIOGRAM REPORT   Patient Name:   JAMESMICHAEL SHADD Date of Exam: 08/11/2020 Medical Rec #:  [id number]      Height:       70.0 in Accession #:    [id number]     Weight:       353.0 lb Date of Birth:  29-Jan-1966       BSA:          2.657 m Patient Age:    54 years       BP:           130/72 mmHg Patient Gender: M              HR:           79 bpm. Exam Location:  Inpatient Procedure: 2D Echo, Cardiac Doppler and Color Doppler Indications:    Pulmonary Embolus  History:        Patient has no prior history of Echocardiogram examinations.                 Risk Factors:Diabetes.  Sonographer:    Shirlean Kelly Referring Phys: [id number] CAROLE N HALL IMPRESSIONS  1. Left ventricular ejection fraction, by estimation, is 40 to 45%. The left ventricle has mildly decreased function. The left ventricle has no regional wall motion  abnormalities. There is mild concentric left ventricular hypertrophy. Left ventricular diastolic parameters are consistent with Grade II diastolic dysfunction (pseudonormalization). Elevated left atrial pressure. There is the interventricular septum is flattened in systole and diastole, consistent with right ventricular pressure and volume  overload.  2. Right ventricular systolic function is normal. The right ventricular size is moderately enlarged.  3. The mitral valve is normal in structure. No  evidence of mitral valve regurgitation. No evidence of mitral stenosis.  4. The aortic valve is tricuspid. Aortic valve regurgitation is not visualized. No aortic stenosis is present.  5. The inferior vena cava is normal in size with greater than 50% respiratory variability, suggesting right atrial pressure of 3 mmHg. FINDINGS  Left Ventricle: Left ventricular ejection fraction, by estimation, is 40 to 45%. The left ventricle has mildly decreased function. The left ventricle has no regional wall motion abnormalities. The left ventricular internal cavity size was normal in size. There is mild concentric left ventricular hypertrophy. The interventricular septum is flattened in systole and diastole, consistent with right ventricular pressure and volume overload. Left ventricular diastolic parameters are consistent with Grade  II diastolic dysfunction (pseudonormalization). Elevated left atrial pressure. Right Ventricle: The right ventricular size is moderately enlarged. No increase in right ventricular wall thickness. Right ventricular systolic function is normal. Left Atrium: Left atrial size was normal in size. Right Atrium: Right atrial size was normal in size. Pericardium: There is no evidence of pericardial effusion. Mitral Valve: The mitral valve is normal in structure. No evidence of mitral valve regurgitation. No evidence of mitral valve stenosis. Tricuspid Valve: The tricuspid valve is normal in structure. Tricuspid valve regurgitation is not demonstrated. No evidence of tricuspid stenosis. Aortic Valve: The aortic valve is tricuspid. Aortic valve regurgitation is not visualized. No aortic stenosis is present. Aortic valve mean gradient measures 3.0 mmHg. Aortic valve peak gradient measures 5.6 mmHg. Aortic valve area, by VTI measures 3.01 cm. Pulmonic Valve: The pulmonic valve was normal in structure. Pulmonic valve regurgitation is not visualized. No evidence of pulmonic stenosis. Aorta: The aortic root is  normal in size and structure. Venous: The inferior vena cava is normal in size with greater than 50% respiratory variability, suggesting right atrial pressure of 3 mmHg. IAS/Shunts: No atrial level shunt detected by color flow Doppler.  LEFT VENTRICLE PLAX 2D LVIDd:         4.50 cm  Diastology LVIDs:         3.40 cm  LV e' medial:    4.46 cm/s LV PW:         1.50 cm  LV E/e' medial:  16.5 LV IVS:        1.50 cm  LV e' lateral:   6.96 cm/s LVOT diam:     2.10 cm  LV E/e' lateral: 10.6 LV SV:         66 LV SV Index:   25 LVOT Area:     3.46 cm  RIGHT VENTRICLE RV Basal diam:  5.70 cm RV S prime:     14.90 cm/s TAPSE (M-mode): 2.8 cm LEFT ATRIUM             Index       RIGHT ATRIUM           Index LA diam:        3.80 cm 1.43 cm/m  RA Area:     23.40 cm LA Vol (A2C):   52.0 ml 19.57 ml/m RA Volume:   83.60 ml  31.46 ml/m LA Vol (A4C):   57.1 ml 21.49 ml/m LA Biplane Vol: 55.8 ml 21.00 ml/m  AORTIC VALVE                   PULMONIC VALVE AV Area (Vmax):    2.78 cm    PV Vmax:       0.84 m/s AV Area (Vmean):   2.76 cm    PV Peak grad:  2.8 mmHg AV Area (VTI):     3.01 cm AV Vmax:           118.00 cm/s AV Vmean:          79.000 cm/s AV VTI:            0.220 m AV Peak Grad:      5.6 mmHg AV Mean Grad:      3.0 mmHg LVOT Vmax:         94.60 cm/s LVOT Vmean:        62.900 cm/s LVOT VTI:          0.191 m LVOT/AV VTI ratio: 0.87  AORTA Ao Root diam: 3.40 cm Ao Asc diam:  3.50 cm MITRAL VALVE MV Area (PHT): 5.06 cm    SHUNTS MV Decel Time: 150 msec    Systemic VTI:  0.19 m MV E velocity: 73.80 cm/s  Systemic Diam: 2.10 cm MV A velocity: 69.50 cm/s MV E/A ratio:  1.06 Mihai Croitoru MD Electronically signed by Thurmon Fair MD Signature Date/Time: 08/11/2020/1:45:23 PM    Final    VAS [place] LOWER EXTREMITY VENOUS (DVT)  Result Date: 08/11/2020  Lower Venous DVT Study Patient Name:  KADIEN LINEMAN  Date of Exam:   08/11/2020 Medical Rec #: [id number]       Accession #:    [id number] Date of Birth: 07/05/1965        Patient  Gender: M Patient Age:   54Y Exam Location:  [facility] Procedure:      VAS [place] LOWER EXTREMITY VENOUS (DVT) Referring Phys: [id number] CAROLE N HALL --------------------------------------------------------------------------------  Indications: Pulmonary embolism.  Comparison Study: No prior study Performing Technologist: Gertie Fey MHA, RDMS, RVT, RDCS  Examination Guidelines: A complete evaluation includes B-mode imaging, spectral Doppler, color Doppler, and power Doppler as needed of all accessible portions of each vessel. Bilateral testing is considered an integral part of a complete examination. Limited examinations for reoccurring indications may be performed as noted. The reflux portion of the exam is performed with the patient in reverse Trendelenburg.  +---------+---------------+---------+-----------+----------+--------------+ RIGHT    CompressibilityPhasicitySpontaneityPropertiesThrombus Aging +---------+---------------+---------+-----------+----------+--------------+ CFV      Full           Yes      Yes                                 +---------+---------------+---------+-----------+----------+--------------+ SFJ      Full                                                        +---------+---------------+---------+-----------+----------+--------------+ FV Prox  Full                                                        +---------+---------------+---------+-----------+----------+--------------+  FV Mid   Full                                                        +---------+---------------+---------+-----------+----------+--------------+ FV DistalFull                                                        +---------+---------------+---------+-----------+----------+--------------+ PFV      Full                                                        +---------+---------------+---------+-----------+----------+--------------+ POP      Full            Yes      Yes                                 +---------+---------------+---------+-----------+----------+--------------+ PTV      Full                    Yes                                 +---------+---------------+---------+-----------+----------+--------------+ PERO     Full                    Yes                                 +---------+---------------+---------+-----------+----------+--------------+   +---------+---------------+---------+-----------+----------+--------------+ LEFT     CompressibilityPhasicitySpontaneityPropertiesThrombus Aging +---------+---------------+---------+-----------+----------+--------------+ CFV      Full           Yes      Yes                                 +---------+---------------+---------+-----------+----------+--------------+ SFJ      Full                                                        +---------+---------------+---------+-----------+----------+--------------+ FV Prox  Full                                                        +---------+---------------+---------+-----------+----------+--------------+ FV Mid   Full                                                        +---------+---------------+---------+-----------+----------+--------------+  FV DistalFull                                                        +---------+---------------+---------+-----------+----------+--------------+ PFV      Full                                                        +---------+---------------+---------+-----------+----------+--------------+ POP      Full           Yes      Yes                                 +---------+---------------+---------+-----------+----------+--------------+ PTV      Full                                                        +---------+---------------+---------+-----------+----------+--------------+ PERO     None                    No                   Acute           +---------+---------------+---------+-----------+----------+--------------+     Summary: RIGHT: - There is no evidence of deep vein thrombosis in the lower extremity.  - No cystic structure found in the popliteal fossa.  LEFT: - Findings consistent with acute deep vein thrombosis involving the left peroneal veins. - No cystic structure found in the popliteal fossa.  *See table(s) above for measurements and observations. Electronically signed by Fabienne Bruns MD on 08/11/2020 at 4:25:20 PM.    Final

## 2020-08-12 NOTE — Progress Notes (Signed)
PT Cancellation Note  Patient Details Name: Chad Rivas MRN: [id number] DOB: 06-27-1965   Cancelled Treatment:    Reason Eval/Treat Not Completed: Other (comment) per chart, still not therapeutic on heparin in setting of acute PE and DVT but transitioning to Apixaban today. First Apixaban dose given at 0759, per dept protocol PT will not be able to return until 3 hours after- so around 1100. Will attempt to return as time/schedule allow.    Madelaine Etienne, DPT, PN1   Supplemental Physical Therapist [facility]    Pager [phone] Acute Rehab Office [phone]

## 2020-08-12 NOTE — Discharge Instructions (Addendum)
Follow with Primary MD Marden Noble, MD in 7 days   Get CBC, CMP, 2 view Chest X ray -  checked next visit within 1 week by Primary MD    Activity: As tolerated with Full fall precautions use Neece/cane & assistance as needed  Disposition Home   Diet: Heart Healthy - Low Carb  Accuchecks 4 times/day, Once in AM empty stomach and then before each meal. Log in all results and show them to your Prim.MD in 3 days. If any glucose reading is under 80 or above 300 call your Prim MD immidiately. Follow Low glucose instructions for glucose under 80 as instructed.   Special Instructions: If you have smoked or chewed Tobacco  in the last 2 yrs please stop smoking, stop any regular Alcohol  and or any Recreational drug use.  On your next visit with your primary care physician please Get Medicines reviewed and adjusted.  Please request your Prim.MD to go over all Hospital Tests and Procedure/Radiological results at the follow up, please get all Hospital records sent to your Prim MD by signing hospital release before you go home.  If you experience worsening of your admission symptoms, develop shortness of breath, life threatening emergency, suicidal or homicidal thoughts you must seek medical attention immediately by calling 911 or calling your MD immediately  if symptoms less severe.  You Must read complete instructions/literature along with all the possible adverse reactions/side effects for all the Medicines you take and that have been prescribed to you. Take any new Medicines after you have completely understood and accpet all the possible adverse reactions/side effects.      Information on my medicine - ELIQUIS (apixaban)  Why was Eliquis prescribed for you? Eliquis was prescribed to treat blood clots that may have been found in the veins of your legs (deep vein thrombosis) or in your lungs (pulmonary embolism) and to reduce the risk of them occurring again.  What do You need to know  about Eliquis ? The starting dose is 10 mg (two 5 mg tablets) taken TWICE daily for the FIRST SEVEN (7) DAYS, then the dose is reduced to ONE 5 mg tablet taken TWICE daily.  Eliquis may be taken with or without food.   Try to take the dose about the same time in the morning and in the evening. If you have difficulty swallowing the tablet whole please discuss with your pharmacist how to take the medication safely.  Take Eliquis exactly as prescribed and DO NOT stop taking Eliquis without talking to the doctor who prescribed the medication.  Stopping may increase your risk of developing a new blood clot.  Refill your prescription before you run out.  After discharge, you should have regular check-up appointments with your healthcare provider that is prescribing your Eliquis.    What do you do if you miss a dose? If a dose of ELIQUIS is not taken at the scheduled time, take it as soon as possible on the same day and twice-daily administration should be resumed. The dose should not be doubled to make up for a missed dose.  Important Safety Information A possible side effect of Eliquis is bleeding. You should call your healthcare provider right away if you experience any of the following: Bleeding from an injury or your nose that does not stop. Unusual colored urine (red or dark brown) or unusual colored stools (red or black). Unusual bruising for unknown reasons. A serious fall or if you hit your head (even if  there is no bleeding).  Some medicines may interact with Eliquis and might increase your risk of bleeding or clotting while on Eliquis. To help avoid this, consult your healthcare provider or pharmacist prior to using any new prescription or non-prescription medications, including herbals, vitamins, non-steroidal anti-inflammatory drugs (NSAIDs) and supplements.  This website has more information on Eliquis (apixaban): http://www.eliquis.com/eliquis/home

## 2020-08-12 NOTE — Progress Notes (Signed)
Pt does not use CPAP at home, and is aware that he can try CPAP if he changes his mind.

## 2020-08-12 NOTE — Progress Notes (Addendum)
Inpatient Diabetes Program Recommendations  AACE/ADA: New Consensus Statement on Inpatient Glycemic Control  Target Ranges:  Prepandial:   less than 140 mg/dL      Peak postprandial:   less than 180 mg/dL (1-2 hours)      Critically ill patients:  140 - 180 mg/dL  Results for Chad Rivas, Chad Rivas (MRN [id number]) as of 08/12/2020 06:52  Ref. Range 08/12/2020 00:33  Glucose Latest Ref Range: 70 - 99 mg/dL 298 (H)   Results for Chad Rivas, Chad Rivas (MRN [id number]) as of 08/12/2020 06:52  Ref. Range 08/11/2020 07:32 08/11/2020 11:31 08/11/2020 16:19 08/11/2020 22:18  Glucose-Capillary Latest Ref Range: 70 - 99 mg/dL 284 (H)  Novolog 5 units  Lantus 10 units 395 (H)  Novolog 9 units 334 (H)  Novolog 12 units 325 (H)  Novolog 4 units  Results for Chad Rivas, Chad Rivas (MRN [id number]) as of 08/12/2020 06:52  Ref. Range 07/14/2017 05:30 08/11/2020 05:31  Hemoglobin A1C Latest Ref Range: 4.8 - 5.6 % 14.2 (H) 11.8 (H)    Review of Glycemic Control  Diabetes history: DM2 Outpatient Diabetes medications: Amaryl 4 mg BID, Metformin 1000 mg BID Current orders for Inpatient glycemic control: Lantus 15 units daily, Novolog 5 units TID with meals, Novolog 0-9 units TID with meals, Novolog 0-5 units QHS  Inpatient Diabetes Program Recommendations:    Insulin: Please consider increasing Lantus to 30 units daily and meal coverage to Novolog 10 units TID with meals.  HbgA1C:  A1C 11.8% on 08/11/20 indicating an average glucose of 292 mg/dl over the past 2-3 months.   Addendum 08/12/20 @ 13:20-Diabetes coordinator working remotely. Spoke with patient over the phone about diabetes and home regimen for diabetes control. Patient reports being followed by PCP for diabetes management and currently taking Amaryl 4 mg BID and Metformin 1000 mg BID as an outpatient for diabetes control. Patient reports taking DM medications as prescribed. Patient reports checking glucose 1 time a day in the morning and notes his average glucose is 170 mg/dl in  the mornings.   Inquired about prior A1C and patient reports not being able to recall last A1C value but notes it was high. Discussed A1C results (11.8% on 08/11/20) and explained that current A1C indicates an average glucose of 292 mg/dl over the past 2-3 months. Discussed glucose and A1C goals.  Discussed importance of checking CBGs and maintaining good CBG control to prevent long-term and short-term complications. Explained how hyperglycemia leads to damage within blood vessels which lead to the common complications seen with uncontrolled diabetes. Stressed to the patient the importance of improving glycemic control to prevent further complications from uncontrolled diabetes. Patient states that his PCP has mentioned him starting insulin and he is willing to take insulin outpatient. Patient states he has never taken insulin in the past but notes that he has assisted his grandmother with insulin administration (vial/syringe) in the past. Discussed insulin administration and using via/syringe versus insulin pens. Patient states that he thinks he would like to use an insulin pen if prescribed insulin at discharge. Informed patient that I would ask coworker on [place] to come by and show him how to use an insulin pen. Also informed patient that a Living Well with DM book and an insulin pen starter kit. Encouraged patient to review the information once received. Patient states his next appointment with PCP is 09/23/20. Encouraged patient to starting checking glucose 3-4 times a day and be sure to consistently follow up with PCP.  Patient verbalized understanding of  information discussed and reports no further questions at this time related to diabetes.   Thanks, Barnie Alderman, RN, MSN, CDE Diabetes Coordinator Inpatient Diabetes Program [phone] (Team Pager from 8am to 5pm)

## 2020-08-12 NOTE — Progress Notes (Signed)
Inpatient Diabetes Program Recommendations  AACE/ADA: New Consensus Statement on Inpatient Glycemic Control (2015)  Target Ranges:  Prepandial:   less than 140 mg/dL      Peak postprandial:   less than 180 mg/dL (1-2 hours)      Critically ill patients:  140 - 180 mg/dL   Lab Results  Component Value Date   GLUCAP 263 (H) 08/12/2020   HGBA1C 11.8 (H) 08/11/2020    Spoke with patient at bedside along with his daughter.  Educated patient on insulin pen use at home. Reviewed contents of insulin flexpen starter kit. Reviewed all steps of insulin pen including attachment of needle, 2-unit air shot, dialing up dose, giving injection, removing needle, disposal of sharps, storage of unused insulin, disposal of insulin etc. Patient able to provide successful return demonstration. Also reviewed troubleshooting with insulin pen. MD to give patient Rxs for insulin pens and insulin pen needles.  We reviewed hypoglycemia, signs, symptoms and treatment.  He is aware that each pen has multiple doses and to use the pen until it no longer dials his dose or if its been open for more than 30 days.     Thank you, Jen Miller, RN, BSN Diabetes Coordinator Inpatient Diabetes Program [phone] (team pager from 8a-5p)      

## 2020-08-12 NOTE — Progress Notes (Signed)
OT Cancellation Note  Patient Details Name: Chad Rivas MRN: [id number] DOB: 1965/03/14   Cancelled Treatment:    Reason Eval/Treat Not Completed: OT screened, no needs identified, will sign off PT has evaluated pt and reports he is functioning close to baseline with no acute needs identified at this time. Pt ambulated 248feet without AD and maintained SpO2 >92%. OT will sign off. Please re-order should pt's status change.   [facility] OTR/L Acute Rehabilitation Services Office: [phone]   Rebeca Alert 08/12/2020, 12:35 PM

## 2020-08-12 NOTE — Telephone Encounter (Signed)
appt scheduled for 09/11/20 at 11:30 am

## 2020-08-12 NOTE — Progress Notes (Signed)
ANTICOAGULATION CONSULT NOTE - Initial Consult  Pharmacy Consult for Apixaban Indication: pulmonary embolus Allergies  Allergen Reactions  . Penicillins Anaphylaxis and Other (See Comments)    Has patient had a PCN reaction causing immediate rash, facial/tongue/throat swelling, SOB or lightheadedness with hypotension: Y Has patient had a PCN reaction causing severe rash involving mucus membranes or skin necrosis: Y Has patient had a PCN reaction that required hospitalization: Y Has patient had a PCN reaction occurring within the last 10 years: N If all of the above answers are "NO", then may proceed with Cephalosporin use.   . Losartan Other (See Comments)    Cause angioedema    Patient Measurements: Height: 5\' 10"  (177.8 cm) Weight: (!) 160.1 kg (353 lb) IBW/kg (Calculated) : 73  Vital Signs: Temp: 98.9 F (37.2 C) (06/08 0503) Temp Source: Axillary (06/08 0503) BP: 153/85 (06/08 0503) Pulse Rate: 85 (06/08 0503)  Labs: Recent Labs    08/10/20 1819 08/10/20 1842 08/10/20 2121 08/11/20 0013 08/11/20 0531 08/11/20 0611 08/11/20 1600 08/12/20 0033  HGB 13.0 15.0  --   --  12.3*  --   --  12.2*  HCT 43.5 44.0  --   --  41.5  --   --  40.9  PLT 345  --   --   --  299  --   --  289  APTT  --   --   --   --   --  35  --   --   LABPROT  --   --   --   --   --  13.7  --  14.1  INR  --   --   --   --   --  1.1  --  1.1  HEPARINUNFRC  --   --   --   --   --  <0.10* 0.21* 0.21*  CREATININE 1.11  --   --   --  0.91  --   --  0.91  TROPONINIHS 11  --  9 10  --   --   --   --     Estimated Creatinine Clearance: 144.8 mL/min (by C-G formula based on SCr of 0.91 mg/dL).   Medical History: Past Medical History:  Diagnosis Date  . Diabetes mellitus without complication (HCC)   . Sleep apnea    pt has CPAP but does not use     Assessment: 55 y/o M with PE to transition from Heparin to Apixaban, Hgb 12.2, Renal function good.   Goal of Therapy:  Monitor platelets by  anticoagulation protocol: Yes   Plan:  Apixaban 10 mg BID x 7 days, then 5 mg BID Monitor CBC Monitor for bleeding  44, PharmD, BCPS Clinical Pharmacist Phone: [phone]

## 2020-08-13 ENCOUNTER — Other Ambulatory Visit (HOSPITAL_COMMUNITY): Payer: Self-pay

## 2020-08-13 LAB — GLUCOSE, CAPILLARY: Glucose-Capillary: 247 mg/dL — ABNORMAL HIGH (ref 70–99)

## 2020-08-13 MED ORDER — GLUCOSE BLOOD VI STRP
ORAL_STRIP | 0 refills | Status: AC
  Filled 2020-08-13: qty 100, 25d supply, fill #0

## 2020-08-13 MED ORDER — ATORVASTATIN CALCIUM 40 MG PO TABS
40.0000 mg | ORAL_TABLET | Freq: Every day | ORAL | 0 refills | Status: DC
  Filled 2020-08-13: qty 30, 30d supply, fill #0

## 2020-08-13 MED ORDER — BLOOD GLUCOSE MONITOR KIT
PACK | 1 refills | Status: AC
  Filled 2020-08-13: qty 1, 1d supply, fill #0

## 2020-08-13 MED ORDER — INSULIN LISPRO (1 UNIT DIAL) 100 UNIT/ML (KWIKPEN)
PEN_INJECTOR | SUBCUTANEOUS | 0 refills | Status: DC
  Filled 2020-08-13: qty 9, 30d supply, fill #0

## 2020-08-13 MED ORDER — LANTUS SOLOSTAR 100 UNIT/ML ~~LOC~~ SOPN
30.0000 [IU] | PEN_INJECTOR | Freq: Every day | SUBCUTANEOUS | 0 refills | Status: AC
  Filled 2020-08-13: qty 9, 30d supply, fill #0

## 2020-08-13 MED ORDER — LORATADINE 10 MG PO TABS
10.0000 mg | ORAL_TABLET | Freq: Two times a day (BID) | ORAL | Status: DC | PRN
Start: 2020-08-13 — End: 2020-08-28

## 2020-08-13 MED ORDER — INSULIN PEN NEEDLE 32G X 4 MM MISC
0 refills | Status: AC
  Filled 2020-08-13: qty 100, 25d supply, fill #0

## 2020-08-13 MED ORDER — AUTOLET MINI MISC
0 refills | Status: AC
  Filled 2020-08-13: qty 1, 1d supply, fill #0

## 2020-08-13 MED ORDER — APIXABAN 5 MG PO TABS
10.0000 mg | ORAL_TABLET | Freq: Two times a day (BID) | ORAL | 0 refills | Status: DC
  Filled 2020-08-13: qty 66, 30d supply, fill #0

## 2020-08-13 MED ORDER — TRUEPLUS LANCETS 28G MISC
0 refills | Status: AC
Start: 2020-08-13 — End: ?
  Filled 2020-08-13: qty 100, 25d supply, fill #0

## 2020-08-13 MED ORDER — CARVEDILOL 3.125 MG PO TABS
3.1250 mg | ORAL_TABLET | Freq: Two times a day (BID) | ORAL | 0 refills | Status: DC
  Filled 2020-08-13: qty 60, 30d supply, fill #0

## 2020-08-13 MED ORDER — EPINEPHRINE 0.3 MG/0.3ML IJ SOAJ: 0.3000 mg | Freq: Once | INTRAMUSCULAR | Status: AC | PRN

## 2020-08-13 MED ORDER — APIXABAN 5 MG PO TABS
5.0000 mg | ORAL_TABLET | Freq: Two times a day (BID) | ORAL | 0 refills | Status: DC
  Filled 2020-08-13: qty 60, 30d supply, fill #0

## 2020-08-13 NOTE — Progress Notes (Signed)
NAME:  Chad Rivas, MRN:  [id number], DOB:  1965-09-08, LOS: 3 ADMISSION DATE:  08/10/2020, CONSULTATION DATE:  08/10/20 REFERRING MD:  Audley Hose, CHIEF COMPLAINT:  SOB   History of Present Illness:  Chad Rivas is a 55 year old man presenting with progressive SOB.  PMX notable for DM2, OSA, morbid obesity. Hx of blood clot 15 years ago post hip surgery. Currently employed as a Merchandiser, retail. States that he has been sitting a lot lately. States that he is up to date on his colonoscopies and is having his PSA checked. Denies smoking and family history of blood clots.  Over the past 2-3 days the patient became progressively SOB. On 6/6 he presented to urgent care and was Found to be hypoxemic. He was sent to [facility] ED.  ED workup was notable for a CTA showing PE. BNP and troponin WNL. D dimer elevated in ER leading to CTA showing PE.   PCCM consulted to assist with potential RH strain.  Pertinent  Medical History  Type 2 DM, OSA, Morbid obesity  Significant Hospital Events: Including procedures, antibiotic start and stop dates in addition to other pertinent events   6/6 Presented to urgent care with SOB, transferred to [facility]. CTA showed PE. Heparin drip started. 6/8 transitioned to eliquis therapy  Interim History / Subjective:   No acute events overnight. He did not use CPAP overnight again. He denies any issues at this time.   Objective   Blood pressure 111/88, pulse 92, temperature 97.6 F (36.4 C), temperature source Oral, resp. rate (!) 21, height 5\' 10"  (1.778 m), weight (!) 160.1 kg, SpO2 96 %. 1LNC        Intake/Output Summary (Last 24 hours) at 08/13/2020 0810 Last data filed at 08/13/2020 0336 Gross per 24 hour  Intake --  Output 620 ml  Net -620 ml   Filed Weights   08/10/20 2223  Weight: (!) 160.1 kg    Examination: General:  Sitting up in chair, no acute distress, obese HEENT: [place]/AT, moist mucous membranes, sclera anicteric Neuro: moving all extremities, alert and  oriented x 3 CV: S1S2, NSR, no m/r/g appreciated PULM: clear to auscultation bilaterally. No wheezing or rhonchi. GI: soft, bsx4 active, nondistended Extremities: no edema, warm Skin: no rashes or lesions   Labs/imaging that I have personally reviewed  (right click and "Reselect all SmartList Selections" daily)  6/6 BNP 36.9 Trop <0.03 Ddimer 3.36  Glucose 178, 323, 284  CXR- No pneumo of effusion CTA chest- pulmonary embolus of the distal right main pulmonary artery 12 lead- ST, no ischemic changes  ECHO: LV EF 40-45%. Mild concentric LV hypertrophy. Grade II diastolic dysfunction. Interventricular septum is flattened in systole and diastole. RV systolic function is normal. RV size is moderately enlarged.   LE Duplex 2224: acute VT of left peroneal veins  Resolved Hospital Problem list     Assessment & Plan:  Pulmonary Embolism - Unprovoked Acute DVT of Left lower extremity Acute Respiratory Failure with Hypoxia - Secondary to PE Hx OSA Hx DVT- 15 years ago CTA chest +for PE, D dimer 3.36. Trop and BNP WNL.  PESI score 62- Class 1, very low risk. - Continue eliquis for anticoagulation - Patient has qualified for supplemental oxygen when ambulating - CPAP at night for OSA. He is to follow up with his sleep specialist about getting his CPAP machine as he never went back for follow up due to the pandemic.  Follow up scheduled with me 09/11/20 at  11:30am.   Patient is safe for discharge  Labs   CBC: Recent Labs  Lab 08/10/20 1819 08/10/20 1842 08/11/20 0531 08/12/20 0033  WBC 10.6*  --  10.2 10.0  NEUTROABS 6.9  --   --   --   HGB 13.0 15.0 12.3* 12.2*  HCT 43.5 44.0 41.5 40.9  MCV 79.8*  --  80.0 79.7*  PLT 345  --  299 289    Basic Metabolic Panel: Recent Labs  Lab 08/10/20 1819 08/10/20 1842 08/11/20 0531 08/12/20 0033  NA 133* 136 133* 135  K 3.9 3.9 3.8 3.7  CL 98  --  96* 97*  CO2 25  --  29 28  GLUCOSE 373*  --  332* 298*  BUN 13  --  13 12   CREATININE 1.11  --  0.91 0.91  CALCIUM 9.0  --  8.9 8.6*  MG  --   --  1.4*  --   PHOS  --   --  3.2  --    GFR: Estimated Creatinine Clearance: 144.8 mL/min (by C-G formula based on SCr of 0.91 mg/dL). Recent Labs  Lab 08/10/20 1819 08/11/20 0531 08/12/20 0033  WBC 10.6* 10.2 10.0    Liver Function Tests: No results for input(s): AST, ALT, ALKPHOS, BILITOT, PROT, ALBUMIN in the last 168 hours. No results for input(s): LIPASE, AMYLASE in the last 168 hours. No results for input(s): AMMONIA in the last 168 hours.  ABG    Component Value Date/Time   HCO3 30.5 (H) 08/10/2020 1842   TCO2 32 08/10/2020 1842   O2SAT 99.0 08/10/2020 1842     Coagulation Profile: Recent Labs  Lab 08/11/20 0611 08/12/20 0033  INR 1.1 1.1    Cardiac Enzymes: No results for input(s): CKTOTAL, CKMB, CKMBINDEX, TROPONINI in the last 168 hours.  HbA1C: Hgb A1c MFr Bld  Date/Time Value Ref Range Status  08/11/2020 05:31 AM 11.8 (H) 4.8 - 5.6 % Final    Comment:    (NOTE)         Prediabetes: 5.7 - 6.4         Diabetes: >6.4         Glycemic control for adults with diabetes: <7.0   07/14/2017 05:30 AM 14.2 (H) 4.8 - 5.6 % Final    Comment:    (NOTE) Pre diabetes:          5.7%-6.4% Diabetes:              >6.4% Glycemic control for   <7.0% adults with diabetes     CBG: Recent Labs  Lab 08/12/20 0749 08/12/20 1242 08/12/20 1706 08/12/20 2004 08/13/20 0752  GLUCAP 267* 263* 261* 220* 247*       Critical care time: N/A    Melody Comas, MD [facility] Office: [phone]   See Amion for personal pager PCCM on call pager [phone] until 7pm. Please call Elink 7p-7a. [phone]

## 2020-08-13 NOTE — Discharge Summary (Signed)
Chad Rivas WKM:[id number] DOB: [birth year] DOA: 08/10/2020  PCP: Josetta Huddle, MD  Admit date: 08/10/2020  Discharge date: 08/13/2020  Admitted From: Home  Disposition:  Home   Recommendations for Outpatient Follow-up:   Follow up with PCP in 1-2 weeks  PCP Please obtain BMP/CBC, 2 view CXR in 1week,  (see Discharge instructions)   PCP Please follow up on the following pending results:    Home Health: None   Equipment/Devices: None  Consultations: PCCM Discharge Condition: Stable    CODE STATUS: Full    Diet Recommendation: Heart Healthy Low Carb  CC - SOB    Brief history of present illness from the day of admission and additional interim summary    Chad Rivas is a 55 year old man presenting with progressive SOB.   PMX notable for DM2, OSA, morbid obesity. Hx of blood clot 15 years ago post hip surgery. Currently employed as a Librarian, academic. States that he has been sitting a lot lately. States that he is up to date on his colonoscopies and is having his PSA checked. Denies smoking and family history of blood clots.   Over the past 2-3 days the patient became progressively SOB. On 6/6 he presented to urgent care and was Found to be hypoxemic. He was sent to the [facility] ED.   ED workup was notable for a CTA showing PE. BNP and troponin WNL. D dimer elevated in ER leading to CTA showing PE.                                                                 Hospital Course     Acute Hypoxic Resp. Failure due to Acute PE + L.Leg DVT - he has had a history of DVT in the past, he was initially in ICU on IV heparin now transitioned to Eliquis, he is now symptom-free on room air ambulated in the hallway with pulse ox remaining above 90%, will be discharged home on Eliquis, he most likely will now require lifelong  anticoagulation.  2.  Morbid obesity with BMI of 50 and OSA.  Outpatient sleep study requested, follow with PCP for weight loss.   3.  Chronic diastolic CHF along with mild systolic dysfunction of EF 45%.  No acute issues but will have him follow-up with cardiology outpatient, will place him on Coreg and statin for now for secondary prevention.   4.  DM type II.  was on Glucophage only at home, A1c was greater than 11 suggesting poor outpatient control due to hyperglycemia.  He is now placed on Lantus and sliding scale, education provided, testing supplies provided.  Lab Results  Component Value Date   HGBA1C 11.8 (H) 08/11/2020     Discharge diagnosis     Principal Problem:   Acute pulmonary embolism (  [place]) Active Problems:   Type 2 diabetes mellitus with hyperglycemia, without long-term current use of insulin ([place])   Morbidly obese ([place])   Acute hypoxemic respiratory failure ([place])    Discharge instructions    Discharge Instructions     Diet - low sodium heart healthy   Complete by: As directed    Discharge instructions   Complete by: As directed    Follow with Primary MD Josetta Huddle, MD in 7 days   Get CBC, CMP, 2 view Chest X ray -  checked next visit within 1 week by Primary MD    Activity: As tolerated with Full fall precautions use Valentine/cane & assistance as needed  Disposition Home   Diet: Heart Healthy - Low Carb  Accuchecks 4 times/day, Once in AM empty stomach and then before each meal. Log in all results and show them to your Prim.MD in 3 days. If any glucose reading is under 80 or above 300 call your Prim MD immidiately. Follow Low glucose instructions for glucose under 80 as instructed.   Special Instructions: If you have smoked or chewed Tobacco  in the last 2 yrs please stop smoking, stop any regular Alcohol  and or any Recreational drug use.  On your next visit with your primary care physician please Get Medicines reviewed and adjusted.  Please  request your Prim.MD to go over all Hospital Tests and Procedure/Radiological results at the follow up, please get all Hospital records sent to your Prim MD by signing hospital release before you go home.  If you experience worsening of your admission symptoms, develop shortness of breath, life threatening emergency, suicidal or homicidal thoughts you must seek medical attention immediately by calling 911 or calling your MD immediately  if symptoms less severe.  You Must read complete instructions/literature along with all the possible adverse reactions/side effects for all the Medicines you take and that have been prescribed to you. Take any new Medicines after you have completely understood and accpet all the possible adverse reactions/side effects.   Increase activity slowly   Complete by: As directed        Discharge Medications   Allergies as of 08/13/2020       Reactions   Penicillins Anaphylaxis, Other (See Comments)   Has patient had a PCN reaction causing immediate rash, facial/tongue/throat swelling, SOB or lightheadedness with hypotension: Y Has patient had a PCN reaction causing severe rash involving mucus membranes or skin necrosis: Y Has patient had a PCN reaction that required hospitalization: Y Has patient had a PCN reaction occurring within the last 10 years: N If all of the above answers are "NO", then may proceed with Cephalosporin use.   Losartan Other (See Comments)   Cause angioedema        Medication List     STOP taking these medications    ciprofloxacin 500 MG tablet Commonly known as: CIPRO   HYDROcodone-acetaminophen 10-325 MG tablet Commonly known as: NORCO   ibuprofen 200 MG tablet Commonly known as: ADVIL   loperamide 2 MG tablet Commonly known as: Imodium A-D   metroNIDAZOLE 500 MG tablet Commonly known as: FLAGYL   ondansetron 4 MG disintegrating tablet Commonly known as: Zofran ODT   sulfamethoxazole-trimethoprim 800-160 MG  tablet Commonly known as: BACTRIM DS       TAKE these medications    apixaban 5 MG Tabs tablet Commonly known as: ELIQUIS Take 2 tablets (10 mg total) by mouth 2 (two) times daily for 5 days.  apixaban 5 MG Tabs tablet Commonly known as: ELIQUIS Take 1 tablet (5 mg total) by mouth 2 (two) times daily. Start taking on: August 19, 2020   atorvastatin 40 MG tablet Commonly known as: LIPITOR Take 1 tablet (40 mg total) by mouth daily. Start taking on: August 14, 2020   blood glucose meter kit and supplies Kit Dispense based on patient and insurance preference. Use up to four times daily as directed. (FOR ICD-9 250.00, 250.01). For QAC - HS accuchecks.   carvedilol 3.125 MG tablet Commonly known as: COREG Take 1 tablet (3.125 mg total) by mouth 2 (two) times daily with a meal.   diphenhydrAMINE 25 MG tablet Commonly known as: Benadryl Take 1 tablet (25 mg total) by mouth every 6 (six) hours as needed for itching (Rash).   EPINEPHrine 0.3 mg/0.3 mL Soaj injection Commonly known as: EpiPen 2-Pak Inject 0.3 mg into the muscle once as needed (For anaphylaxis.).   Ferrex 150 150 MG capsule Generic drug: iron polysaccharides Take 150 mg by mouth daily.   fluticasone 50 MCG/ACT nasal spray Commonly known as: FLONASE Place 1 spray into both nostrils daily as needed for allergies.   glimepiride 2 MG tablet Commonly known as: AMARYL Take 4 mg by mouth 2 (two) times daily.   insulin glargine 100 UNIT/ML injection Commonly known as: Lantus Inject 0.3 mLs (30 Units total) into the skin at bedtime. Dispense insulin pen if approved, if not dispense as needed syringes and needles for 1 month supply. Can switch to Levemir. Diagnosis E 11.65.   insulin lispro 100 UNIT/ML injection Commonly known as: HumaLOG Before each meal 3 times a day, 140-199 - 2 units, 200-250 - 4 units, 251-299 - 6 units,  300-349 - 8 units,  350 or above 10 units. Insulin PEN if approved, provide syringes and  needles if needed.   Insulin Syringe-Needle U-100 25G X 1" 1 ML Misc For 4 times a day insulin SQ, 1 month supply. Diagnosis E11.65   loratadine 10 MG tablet Commonly known as: CLARITIN Take 1 tablet (10 mg total) by mouth 2 (two) times daily as needed for allergies.   metFORMIN 500 MG tablet Commonly known as: GLUCOPHAGE Take 1,000 mg by mouth 2 (two) times daily with a meal.   Multivitamin Adult Tabs Take 1 tablet by mouth daily.   oxymetazoline 0.05 % nasal spray Commonly known as: AFRIN Place 1 spray into both nostrils 2 (two) times daily as needed for congestion.         Follow-up Information     [facility] Follow up.   Why: A representative from [facility] will deliver oxygen to your room and the reserve tanks will be delivered to your home.  Contact information: [address] [place] [address] [phone]         Freddi Starr, MD Follow up on 09/11/2020.   Specialty: Pulmonary Disease Why: Appointment time is 11:30am. Please show up 15 minutes before your appointment. Contact information: [address] [place] [address] [phone]         Josetta Huddle, MD. Schedule an appointment as soon as possible for a visit in 1 week(s).   Specialty: Internal Medicine Contact information: [address] [place] [address] [phone]         Adrian Prows, MD. Schedule an appointment as soon as possible for a visit in 1 week(s).   Specialty: Cardiology Why: EF 45% Contact information: [place]  [place] [address] [phone]                 Major procedures and Radiology Reports - PLEASE review detailed and final reports thoroughly  -     DG Chest 2 View  Result Date: 08/10/2020 CLINICAL DATA:  Shortness of breath, hypoxia EXAM: CHEST - 2 VIEW COMPARISON:  07/13/2017 FINDINGS: The heart size and mediastinal contours are within normal limits. Diffuse bilateral  interstitial prominence. No focal airspace consolidation, pleural effusion, or pneumothorax. The visualized skeletal structures are unremarkable. IMPRESSION: Diffuse bilateral interstitial prominence, which may reflect bronchitic type lung changes versus mild interstitial edema. Electronically Signed   By: Davina Poke D.O.   On: 08/10/2020 19:35   CT Angio Chest PE W and/or Wo Contrast  Result Date: 08/10/2020 CLINICAL DATA:  Shortness of breath. Chest pain. Elevated D-dimer. Suspected pulmonary embolus. EXAM: CT ANGIOGRAPHY CHEST WITH CONTRAST TECHNIQUE: Multidetector CT imaging of the chest was performed using the standard protocol during bolus administration of intravenous contrast. Multiplanar CT image reconstructions and MIPs were obtained to evaluate the vascular anatomy. CONTRAST:  17m OMNIPAQUE IOHEXOL 350 MG/ML SOLN COMPARISON:  CT angiography chest 07/13/2017 FINDINGS: Cardiovascular: Poor opacification of the pulmonary arteries to the proximal segmental level due to timing of contrast and respiratory motion artifact. Distal central right main pulmonary artery filling defect extending to the segmental and subsegmental levels of all 3 right lobes. Segmental and subsegmental left upper lobe pulmonary artery filling defects. Cleary subsegmental left lower lobe pulmonary artery filling defects. No evidence of pulmonary embolism. Enlarged right to left ventricular ratio. No pericardial effusion. Mediastinum/Nodes: No enlarged mediastinal, hilar, or axillary lymph nodes. Thyroid gland, trachea, and esophagus demonstrate no significant findings. Lungs/Pleura: Stable subpleural left lower lobe nodules (6:87, 99) measuring 8 mm and 6 mm. Lungs are clear. No pleural effusion or pneumothorax. Upper Abdomen: Hepatic steatosis.  Otherwise no acute abnormality. Musculoskeletal: No chest wall abnormality. No acute or significant osseous findings. Review of the MIP images confirms the above findings. IMPRESSION:  1. Distal central right pulmonary embolus extending to the segmental and subsegmental levels of all three right lobes. Segmental and subsegmental left upper lobe pulmonary embolus. Associated right heart strain. No pulmonary infarction. 2. Hepatic steatosis. These results were called by telephone at the time of interpretation on 08/10/2020 at 10:11 pm to provider Dr. LLaverta Baltimore, who verbally acknowledged these results and will relay the information to Dr. HAlmyra Free Electronically Signed   By: MIven FinnM.D.   On: 08/10/2020 22:13   ECHOCARDIOGRAM COMPLETE  Result Date: 08/11/2020    ECHOCARDIOGRAM REPORT   Patient Name:   LDAYRON ODLANDDate of Exam: 08/11/2020 Medical Rec #:  [id number]     Height:       70.0 in Accession #:    [id number]    Weight:       353.0 lb Date of Birth:  7May 26, 1967      BSA:          2.657 m Patient Age:    54years       BP:           130/72 mmHg Patient Gender: M              HR:           79 bpm. Exam Location:  Inpatient Procedure: 2D Echo, Cardiac Doppler and Color Doppler Indications:    Pulmonary Embolus  History:  Patient has no prior history of Echocardiogram examinations.                 Risk Factors:Diabetes.  Sonographer:    Cammy Brochure Referring Phys: [id number] [place]  1. Left ventricular ejection fraction, by estimation, is 40 to 45%. The left ventricle has mildly decreased function. The left ventricle has no regional wall motion abnormalities. There is mild concentric left ventricular hypertrophy. Left ventricular diastolic parameters are consistent with Grade II diastolic dysfunction (pseudonormalization). Elevated left atrial pressure. There is the interventricular septum is flattened in systole and diastole, consistent with right ventricular pressure and volume  overload.  2. Right ventricular systolic function is normal. The right ventricular size is moderately enlarged.  3. The mitral valve is normal in structure. No evidence of mitral valve  regurgitation. No evidence of mitral stenosis.  4. The aortic valve is tricuspid. Aortic valve regurgitation is not visualized. No aortic stenosis is present.  5. The inferior vena cava is normal in size with greater than 50% respiratory variability, suggesting right atrial pressure of 3 mmHg. FINDINGS  Left Ventricle: Left ventricular ejection fraction, by estimation, is 40 to 45%. The left ventricle has mildly decreased function. The left ventricle has no regional wall motion abnormalities. The left ventricular internal cavity size was normal in size. There is mild concentric left ventricular hypertrophy. The interventricular septum is flattened in systole and diastole, consistent with right ventricular pressure and volume overload. Left ventricular diastolic parameters are consistent with Grade  II diastolic dysfunction (pseudonormalization). Elevated left atrial pressure. Right Ventricle: The right ventricular size is moderately enlarged. No increase in right ventricular wall thickness. Right ventricular systolic function is normal. Left Atrium: Left atrial size was normal in size. Right Atrium: Right atrial size was normal in size. Pericardium: There is no evidence of pericardial effusion. Mitral Valve: The mitral valve is normal in structure. No evidence of mitral valve regurgitation. No evidence of mitral valve stenosis. Tricuspid Valve: The tricuspid valve is normal in structure. Tricuspid valve regurgitation is not demonstrated. No evidence of tricuspid stenosis. Aortic Valve: The aortic valve is tricuspid. Aortic valve regurgitation is not visualized. No aortic stenosis is present. Aortic valve mean gradient measures 3.0 mmHg. Aortic valve peak gradient measures 5.6 mmHg. Aortic valve area, by VTI measures 3.01 cm. Pulmonic Valve: The pulmonic valve was normal in structure. Pulmonic valve regurgitation is not visualized. No evidence of pulmonic stenosis. Aorta: The aortic root is normal in size and  structure. Venous: The inferior vena cava is normal in size with greater than 50% respiratory variability, suggesting right atrial pressure of 3 mmHg. IAS/Shunts: No atrial level shunt detected by color flow Doppler.  LEFT VENTRICLE PLAX 2D LVIDd:         4.50 cm  Diastology LVIDs:         3.40 cm  LV e' medial:    4.46 cm/s LV PW:         1.50 cm  LV E/e' medial:  16.5 LV IVS:        1.50 cm  LV e' lateral:   6.96 cm/s LVOT diam:     2.10 cm  LV E/e' lateral: 10.6 LV SV:         66 LV SV Index:   25 LVOT Area:     3.46 cm  RIGHT VENTRICLE RV Basal diam:  5.70 cm RV S prime:     14.90 cm/s TAPSE (M-mode): 2.8 cm LEFT ATRIUM  Index       RIGHT ATRIUM           Index LA diam:        3.80 cm 1.43 cm/m  RA Area:     23.40 cm LA Vol (A2C):   52.0 ml 19.57 ml/m RA Volume:   83.60 ml  31.46 ml/m LA Vol (A4C):   57.1 ml 21.49 ml/m LA Biplane Vol: 55.8 ml 21.00 ml/m  AORTIC VALVE                   PULMONIC VALVE AV Area (Vmax):    2.78 cm    PV Vmax:       0.84 m/s AV Area (Vmean):   2.76 cm    PV Peak grad:  2.8 mmHg AV Area (VTI):     3.01 cm AV Vmax:           118.00 cm/s AV Vmean:          79.000 cm/s AV VTI:            0.220 m AV Peak Grad:      5.6 mmHg AV Mean Grad:      3.0 mmHg LVOT Vmax:         94.60 cm/s LVOT Vmean:        62.900 cm/s LVOT VTI:          0.191 m LVOT/AV VTI ratio: 0.87  AORTA Ao Root diam: 3.40 cm Ao Asc diam:  3.50 cm MITRAL VALVE MV Area (PHT): 5.06 cm    SHUNTS MV Decel Time: 150 msec    Systemic VTI:  0.19 m MV E velocity: 73.80 cm/s  Systemic Diam: 2.10 cm MV A velocity: 69.50 cm/s MV E/A ratio:  1.06 Mihai Croitoru MD Electronically signed by Sanda Klein MD Signature Date/Time: 08/11/2020/1:45:23 PM    Final    VAS [place] LOWER EXTREMITY VENOUS (DVT)  Result Date: 08/11/2020  Lower Venous DVT Study Patient Name:  CHIDI SHIRER  Date of Exam:   08/11/2020 Medical Rec #: [id number]       Accession #:    [id number] Date of Birth: 06/27/1965        Patient Gender: M Patient  Age:   54Y Exam Location:  [facility] Procedure:      VAS [place] LOWER EXTREMITY VENOUS (DVT) Referring Phys: [id number] [place] --------------------------------------------------------------------------------  Indications: Pulmonary embolism.  Comparison Study: No prior study Performing Technologist: Maudry Mayhew MHA, RDMS, RVT, RDCS  Examination Guidelines: A complete evaluation includes B-mode imaging, spectral Doppler, color Doppler, and power Doppler as needed of all accessible portions of each vessel. Bilateral testing is considered an integral part of a complete examination. Limited examinations for reoccurring indications may be performed as noted. The reflux portion of the exam is performed with the patient in reverse Trendelenburg.  +---------+---------------+---------+-----------+----------+--------------+ RIGHT    CompressibilityPhasicitySpontaneityPropertiesThrombus Aging +---------+---------------+---------+-----------+----------+--------------+ CFV      Full           Yes      Yes                                 +---------+---------------+---------+-----------+----------+--------------+ SFJ      Full                                                        +---------+---------------+---------+-----------+----------+--------------+  FV Prox  Full                                                        +---------+---------------+---------+-----------+----------+--------------+ FV Mid   Full                                                        +---------+---------------+---------+-----------+----------+--------------+ FV DistalFull                                                        +---------+---------------+---------+-----------+----------+--------------+ PFV      Full                                                        +---------+---------------+---------+-----------+----------+--------------+ POP      Full           Yes      Yes                                  +---------+---------------+---------+-----------+----------+--------------+ PTV      Full                    Yes                                 +---------+---------------+---------+-----------+----------+--------------+ PERO     Full                    Yes                                 +---------+---------------+---------+-----------+----------+--------------+   +---------+---------------+---------+-----------+----------+--------------+ LEFT     CompressibilityPhasicitySpontaneityPropertiesThrombus Aging +---------+---------------+---------+-----------+----------+--------------+ CFV      Full           Yes      Yes                                 +---------+---------------+---------+-----------+----------+--------------+ SFJ      Full                                                        +---------+---------------+---------+-----------+----------+--------------+ FV Prox  Full                                                        +---------+---------------+---------+-----------+----------+--------------+  FV Mid   Full                                                        +---------+---------------+---------+-----------+----------+--------------+ FV DistalFull                                                        +---------+---------------+---------+-----------+----------+--------------+ PFV      Full                                                        +---------+---------------+---------+-----------+----------+--------------+ POP      Full           Yes      Yes                                 +---------+---------------+---------+-----------+----------+--------------+ PTV      Full                                                        +---------+---------------+---------+-----------+----------+--------------+ PERO     None                    No                   Acute           +---------+---------------+---------+-----------+----------+--------------+     Summary: RIGHT: - There is no evidence of deep vein thrombosis in the lower extremity.  - No cystic structure found in the popliteal fossa.  LEFT: - Findings consistent with acute deep vein thrombosis involving the left peroneal veins. - No cystic structure found in the popliteal fossa.  *See table(s) above for measurements and observations. Electronically signed by Ruta Hinds MD on 08/11/2020 at 4:25:20 PM.    Final     Micro Results    Recent Results (from the past 240 hour(s))  Resp Panel by RT-PCR (Flu A&B, Covid) Nasopharyngeal Swab     Status: None   Collection Time: 08/10/20  9:22 PM   Specimen: Nasopharyngeal Swab; Nasopharyngeal(NP) swabs in vial transport medium  Result Value Ref Range Status   SARS Coronavirus 2 by RT PCR NEGATIVE NEGATIVE Final    Comment: (NOTE) SARS-CoV-2 target nucleic acids are NOT DETECTED.  The SARS-CoV-2 RNA is generally detectable in upper respiratory specimens during the acute phase of infection. The lowest concentration of SARS-CoV-2 viral copies this assay can detect is 138 copies/mL. A negative result does not preclude SARS-Cov-2 infection and should not be used as the sole basis for treatment or other patient management decisions. A negative result may occur with  improper specimen collection/handling, submission of specimen other than nasopharyngeal swab, presence of viral mutation(s) within the areas targeted by this assay, and inadequate number  of viral copies(<138 copies/mL). A negative result must be combined with clinical observations, patient history, and epidemiological information. The expected result is Negative.  Fact Sheet for Patients:  EntrepreneurPulse.com.au  Fact Sheet for Healthcare Providers:  IncredibleEmployment.be  This test is no t yet approved or cleared by the [place] FDA and  has been authorized  for detection and/or diagnosis of SARS-CoV-2 by FDA under an Emergency Use Authorization (EUA). This EUA will remain  in effect (meaning this test can be used) for the duration of the COVID-19 declaration under Section 564(b)(1) of the Act, 21 U.S.C.section 360bbb-3(b)(1), unless the authorization is terminated  or revoked sooner.       Influenza A by PCR NEGATIVE NEGATIVE Final   Influenza B by PCR NEGATIVE NEGATIVE Final    Comment: (NOTE) The Xpert Xpress SARS-CoV-2/FLU/RSV plus assay is intended as an aid in the diagnosis of influenza from Nasopharyngeal swab specimens and should not be used as a sole basis for treatment. Nasal washings and aspirates are unacceptable for Xpert Xpress SARS-CoV-2/FLU/RSV testing.  Fact Sheet for Patients: EntrepreneurPulse.com.au  Fact Sheet for Healthcare Providers: IncredibleEmployment.be  This test is not yet approved or cleared by the [place] FDA and has been authorized for detection and/or diagnosis of SARS-CoV-2 by FDA under an Emergency Use Authorization (EUA). This EUA will remain in effect (meaning this test can be used) for the duration of the COVID-19 declaration under Section 564(b)(1) of the Act, 21 U.S.C. section 360bbb-3(b)(1), unless the authorization is terminated or revoked.  Performed at [facility], [place] [address], [place], [place] [address]     Today   Subjective    Verlee Monte today has no headache,no chest abdominal pain,no new weakness tingling or numbness, feels much better wants to go home today.   Objective   Blood pressure 111/88, pulse 92, temperature 97.6 F (36.4 C), temperature source Oral, resp. rate (!) 21, height _0  (1.778 m), weight (!) 160.1 kg, SpO2 96 %.   Intake/Output Summary (Last 24 hours) at 08/13/2020 1034 Last data filed at 08/13/2020 0336 Gross per 24 hour  Intake --  Output 620 ml  Net -620 ml    Exam  Awake Alert, No new F.N  deficits, Normal affect .AT,PERRAL Supple Neck,No JVD, No cervical lymphadenopathy appriciated.  Symmetrical Chest wall movement, Good air movement bilaterally, CTAB RRR,No Gallops,Rubs or new Murmurs, No Parasternal Heave +ve B.Sounds, Abd Soft, Non tender, No organomegaly appriciated, No rebound -guarding or rigidity. No Cyanosis, Clubbing or edema, No new Rash or bruise   Data Review   CBC w Diff:  Lab Results  Component Value Date   WBC 10.0 08/12/2020   HGB 12.2 (L) 08/12/2020   HCT 40.9 08/12/2020   PLT 289 08/12/2020   LYMPHOPCT 26 08/10/2020   MONOPCT 6 08/10/2020   EOSPCT 3 08/10/2020   BASOPCT 0 08/10/2020    CMP:  Lab Results  Component Value Date   NA 135 08/12/2020   K 3.7 08/12/2020   CL 97 (L) 08/12/2020   CO2 28 08/12/2020   BUN 12 08/12/2020   CREATININE 0.91 08/12/2020   PROT 8.1 05/11/2019   ALBUMIN 3.5 05/11/2019   BILITOT 0.8 05/11/2019   ALKPHOS 66 05/11/2019   AST 37 05/11/2019   ALT 49 (H) 05/11/2019  . Lab Results  Component Value Date   HGBA1C 11.8 (H) 08/11/2020     Total Time in preparing paper work, data evaluation and todays exam - 35 minutes  Lala Lund M.D on 08/13/2020 at 10:34 AM  [facility]

## 2020-08-21 ENCOUNTER — Telehealth (HOSPITAL_COMMUNITY): Payer: Self-pay | Admitting: Pharmacist

## 2020-08-21 NOTE — Telephone Encounter (Signed)
Pharmacy Transitions of Care Follow-up Telephone Call  Date of discharge: 60/11/2020  Discharge Diagnosis: PE  How have you been since you were released from the hospital? Good   Medication changes made at discharge:  - START: Atorvastatin, carvedilol, insulin, eliquis  Medication changes verified by the patient? Yes (Yes/No)    Medication Review:   APIXABAN (ELIQUIS)  Apixaban 10 mg BID initiated on 08/14/20. Will switch to apixaban 5 mg BID after 7 days).  - Discussed importance of taking medication around the same time everyday  - Reviewed potential DDIs with patient  - Advised patient of medications to avoid (NSAIDs, ASA)  - Educated that Tylenol (acetaminophen) will be the preferred analgesic to prevent risk of bleeding  - Emphasized importance of monitoring for signs and symptoms of bleeding (abnormal bruising, prolonged bleeding, nose bleeds, bleeding from gums, discolored urine, black tarry stools)  - Advised patient to alert all providers of anticoagulation therapy prior to starting a new medication or having a procedure    Follow-up Appointments:  PCP Hospital f/u appt confirmed? Yes  Specialist Hospital f/u appt confirmed? Yes  If their condition worsens, is the pt aware to call PCP or go to the Emergency Dept.? Yes  Final Patient Assessment: Patient reports he is doing well.  Reviewed medications.

## 2020-08-27 ENCOUNTER — Other Ambulatory Visit: Payer: Self-pay

## 2020-08-27 ENCOUNTER — Encounter: Payer: Self-pay | Admitting: Student

## 2020-08-27 ENCOUNTER — Ambulatory Visit: Payer: 59 | Admitting: Student

## 2020-08-27 VITALS — BP 128/91 | HR 92 | Temp 96.3°F | Ht 71.0 in | Wt 353.0 lb

## 2020-08-27 DIAGNOSIS — I2609 Other pulmonary embolism with acute cor pulmonale: Secondary | ICD-10-CM

## 2020-08-27 DIAGNOSIS — I10 Essential (primary) hypertension: Secondary | ICD-10-CM

## 2020-08-27 DIAGNOSIS — I5042 Chronic combined systolic (congestive) and diastolic (congestive) heart failure: Secondary | ICD-10-CM

## 2020-08-27 DIAGNOSIS — E7800 Pure hypercholesterolemia, unspecified: Secondary | ICD-10-CM

## 2020-08-27 DIAGNOSIS — I82452 Acute embolism and thrombosis of left peroneal vein: Secondary | ICD-10-CM

## 2020-08-27 DIAGNOSIS — Z794 Long term (current) use of insulin: Secondary | ICD-10-CM

## 2020-08-27 DIAGNOSIS — E119 Type 2 diabetes mellitus without complications: Secondary | ICD-10-CM

## 2020-08-27 MED ORDER — CARVEDILOL 3.125 MG PO TABS: 3.1250 mg | ORAL_TABLET | Freq: Two times a day (BID) | ORAL | 3 refills | Status: DC

## 2020-08-27 MED ORDER — ATORVASTATIN CALCIUM 40 MG PO TABS: 40.0000 mg | ORAL_TABLET | Freq: Every day | ORAL | 3 refills | Status: DC

## 2020-08-27 NOTE — Progress Notes (Signed)
Primary Physician/Referring:  Josetta Huddle, MD  Patient ID: Chad Rivas, male    DOB: 1965/08/15, 55 y.o.   MRN: [id number]  Chief Complaint  Patient presents with   Heart Failure   Hospitalization Follow-up   New Patient (Initial Visit)   HPI:    XIONG HAIDAR  is a 55 y.o. male with history of type 2 diabetes, morbid obesity, OSA noncompliant with CPAP, history of DVT after right hip replacement 15 years ago.   Presented to [facility] emergency department 08/11/2020 with progressive shortness of breath, evaluation revealed acute PE and left leg DVT.  He was therefore started on Eliquis and echocardiogram during stay revealed mild systolic dysfunction with LVEF of 45%.  He continues to follow with pulmonology for management of acute PE, however he now presents to establish care in our office for chronic systolic heart failure.  Patient reports he continues to have mild dyspnea on exertion, but is otherwise asymptomatic.  He is tolerating anticoagulation without bleeding diathesis.  He has follow-up appointment scheduled with pulmonology for further management of acute PE and left leg DVT.  Patient is also in the process of working on getting a CPAP machine to treat underlying sleep apnea.  Past Medical History:  Diagnosis Date   Diabetes mellitus without complication ([place])    Sleep apnea    pt has CPAP but does not use    Past Surgical History:  Procedure Laterality Date   IRRIGATION AND DEBRIDEMENT ABSCESS Right 07/13/2017   Procedure: INCISION AND DRAINAGE OF GROIN ABSCESS;  Surgeon: Kathie Rhodes, MD;  Location: [facility]S;  Service: Urology;  Laterality: Right;   JOINT REPLACEMENT  2009   R hip   TOE SURGERY     TOTAL HIP ARTHROPLASTY     History reviewed. No pertinent family history.  Social History   Tobacco Use   Smoking status: Never   Smokeless tobacco: Never  Substance Use Topics   Alcohol use: No   Marital Status: Married   ROS  Review of Systems  Cardiovascular:   Positive for dyspnea on exertion. Negative for chest pain, claudication, leg swelling, near-syncope, orthopnea, palpitations, paroxysmal nocturnal dyspnea and syncope.  Gastrointestinal:  Negative for hematochezia and melena.  Genitourinary:  Negative for hematuria.  Neurological:  Negative for dizziness.   Objective  Blood pressure (!) 128/91, pulse 92, temperature (!) 96.3 F (35.7 C), height _0  (1.803 m), weight (!) 353 lb (160.1 kg), SpO2 94 %.  Vitals with BMI 08/27/2020 08/13/2020 08/13/2020  Height _1  - -  Weight 353 lbs - -  BMI 20.23 - -  Systolic 343 568 616  Diastolic 91 88 74  Pulse 92 92 91     Physical Exam Vitals reviewed.  Constitutional:      Appearance: He is obese.  HENT:     Head: Normocephalic and atraumatic.  Cardiovascular:     Rate and Rhythm: Normal rate and regular rhythm.     Pulses: Intact distal pulses.     Heart sounds: S1 normal and S2 normal. No murmur heard.   No gallop.  Pulmonary:     Effort: Pulmonary effort is normal. No respiratory distress.     Breath sounds: No wheezing, rhonchi or rales.  Abdominal:     General: Bowel sounds are normal.  Musculoskeletal:     Right lower leg: No edema.     Left lower leg: No edema.  Neurological:     Mental Status: He is alert.  Laboratory examination:   Recent Labs    08/10/20 1819 08/10/20 1842 08/11/20 0531 08/12/20 0033  NA 133* 136 133* 135  K 3.9 3.9 3.8 3.7  CL 98  --  96* 97*  CO2 25  --  29 28  GLUCOSE 373*  --  332* 298*  BUN 13  --  13 12  CREATININE 1.11  --  0.91 0.91  CALCIUM 9.0  --  8.9 8.6*  GFRNONAA >60  --  >60 >60   estimated creatinine clearance is 146.7 mL/min (by C-G formula based on SCr of 0.91 mg/dL).  CMP Latest Ref Rng & Units 08/12/2020 08/11/2020 08/10/2020  Glucose 70 - 99 mg/dL 298(H) 332(H) -  BUN 6 - 20 mg/dL 12 13 -  Creatinine 0.61 - 1.24 mg/dL 0.91 0.91 -  Sodium 135 - 145 mmol/L 135 133(L) 136  Potassium 3.5 - 5.1 mmol/L 3.7 3.8 3.9  Chloride  98 - 111 mmol/L 97(L) 96(L) -  CO2 22 - 32 mmol/L 28 29 -  Calcium 8.9 - 10.3 mg/dL 8.6(L) 8.9 -  Total Protein 6.5 - 8.1 g/dL - - -  Total Bilirubin 0.3 - 1.2 mg/dL - - -  Alkaline Phos 38 - 126 U/L - - -  AST 15 - 41 U/L - - -  ALT 0 - 44 U/L - - -   CBC Latest Ref Rng & Units 08/12/2020 08/11/2020 08/10/2020  WBC 4.0 - 10.5 K/uL 10.0 10.2 -  Hemoglobin 13.0 - 17.0 g/dL 12.2(L) 12.3(L) 15.0  Hematocrit 39.0 - 52.0 % 40.9 41.5 44.0  Platelets 150 - 400 K/uL 289 299 -    Lipid Panel No results for input(s): CHOL, TRIG, LDLCALC, VLDL, HDL, CHOLHDL, LDLDIRECT in the last 8760 hours.  HEMOGLOBIN A1C Lab Results  Component Value Date   HGBA1C 11.8 (H) 08/11/2020   MPG 292 08/11/2020   TSH No results for input(s): TSH in the last 8760 hours.  External labs:  None   Allergies   Allergies  Allergen Reactions   Penicillins Anaphylaxis and Other (See Comments)    Has patient had a PCN reaction causing immediate rash, facial/tongue/throat swelling, SOB or lightheadedness with hypotension: Y Has patient had a PCN reaction causing severe rash involving mucus membranes or skin necrosis: Y Has patient had a PCN reaction that required hospitalization: Y Has patient had a PCN reaction occurring within the last 10 years: N If all of the above answers are "NO", then may proceed with Cephalosporin use.    Canagliflozin Other (See Comments)   Sulfamethoxazole-Trimethoprim Other (See Comments)   Losartan Other (See Comments)    Cause angioedema      Medications Prior to Visit:   Outpatient Medications Prior to Visit  Medication Sig Dispense Refill   apixaban (ELIQUIS) 5 MG TABS tablet Take 1 tablet (5 mg total) by mouth 2 (two) times daily. 60 tablet 0   blood glucose meter kit and supplies KIT Dispense based on patient and insurance preference. Use up to four times daily as directed. (FOR ICD-9 250.00, 250.01). For QAC - HS accuchecks. 1 each 1   diphenhydrAMINE (BENADRYL) 25 MG tablet  Take 1 tablet (25 mg total) by mouth every 6 (six) hours as needed for itching (Rash). 30 tablet 0   EPINEPHrine (EPIPEN 2-PAK) 0.3 mg/0.3 mL IJ SOAJ injection Inject 0.3 mg into the muscle once as needed (For anaphylaxis.).     FERREX 150 150 MG capsule Take 150 mg by mouth daily.  3   fluticasone (FLONASE) 50 MCG/ACT nasal spray Place 1 spray into both nostrils daily as needed for allergies.   3   glucose blood test strip Use four times daily as directed 100 each 0   insulin glargine (LANTUS SOLOSTAR) 100 UNIT/ML Solostar Pen Inject 0.3 mLs (30 Units total) into the skin at bedtime. 15 mL 0   insulin lispro (HUMALOG) 100 UNIT/ML KwikPen Before each meal 3 times a day, 140-199 - 2 units, 200-250 - 4 units, 251-299 - 6 units,  300-349 - 8 units,  350 or above 10 units. 15 mL 0   Insulin Pen Needle 32G X 4 MM MISC Use for subcutaneous insulin 4 times daily. 100 each 0   Lancet Devices (AUTOLET MINI) MISC Use as directed 1 each 0   Multiple Vitamins-Minerals (MULTIVITAMIN ADULT) TABS Take 1 tablet by mouth daily.     oxymetazoline (AFRIN) 0.05 % nasal spray Place 1 spray into both nostrils 2 (two) times daily as needed for congestion.     TRUEplus Lancets 28G MISC Use as directed four times daily 100 each 0   apixaban (ELIQUIS) 5 MG TABS tablet Take 2 tablets (10 mg total) by mouth 2 (two) times daily for 5 days, then take 1 tablet twice daily. 66 tablet 0   atorvastatin (LIPITOR) 40 MG tablet Take 1 tablet (40 mg total) by mouth daily. 30 tablet 0   carvedilol (COREG) 3.125 MG tablet Take 1 tablet (3.125 mg total) by mouth 2 (two) times daily with a meal. 60 tablet 0   glimepiride (AMARYL) 2 MG tablet Take 4 mg by mouth 2 (two) times daily. (Patient not taking: Reported on 08/27/2020)     loratadine (CLARITIN) 10 MG tablet Take 1 tablet (10 mg total) by mouth 2 (two) times daily as needed for allergies. (Patient not taking: Reported on 08/27/2020)     metFORMIN (GLUCOPHAGE) 500 MG tablet Take 1,000 mg  by mouth 2 (two) times daily with a meal. (Patient not taking: Reported on 08/27/2020)     No facility-administered medications prior to visit.     Final Medications at End of Visit    Current Meds  Medication Sig   apixaban (ELIQUIS) 5 MG TABS tablet Take 1 tablet (5 mg total) by mouth 2 (two) times daily.   blood glucose meter kit and supplies KIT Dispense based on patient and insurance preference. Use up to four times daily as directed. (FOR ICD-9 250.00, 250.01). For QAC - HS accuchecks.   diphenhydrAMINE (BENADRYL) 25 MG tablet Take 1 tablet (25 mg total) by mouth every 6 (six) hours as needed for itching (Rash).   EPINEPHrine (EPIPEN 2-PAK) 0.3 mg/0.3 mL IJ SOAJ injection Inject 0.3 mg into the muscle once as needed (For anaphylaxis.).   FERREX 150 150 MG capsule Take 150 mg by mouth daily.    fluticasone (FLONASE) 50 MCG/ACT nasal spray Place 1 spray into both nostrils daily as needed for allergies.    glucose blood test strip Use four times daily as directed   insulin glargine (LANTUS SOLOSTAR) 100 UNIT/ML Solostar Pen Inject 0.3 mLs (30 Units total) into the skin at bedtime.   insulin lispro (HUMALOG) 100 UNIT/ML KwikPen Before each meal 3 times a day, 140-199 - 2 units, 200-250 - 4 units, 251-299 - 6 units,  300-349 - 8 units,  350 or above 10 units.   Insulin Pen Needle 32G X 4 MM MISC Use for subcutaneous insulin 4 times daily.   Lancet Devices (  AUTOLET MINI) MISC Use as directed   Multiple Vitamins-Minerals (MULTIVITAMIN ADULT) TABS Take 1 tablet by mouth daily.   oxymetazoline (AFRIN) 0.05 % nasal spray Place 1 spray into both nostrils 2 (two) times daily as needed for congestion.   TRUEplus Lancets 28G MISC Use as directed four times daily   [DISCONTINUED] apixaban (ELIQUIS) 5 MG TABS tablet Take 2 tablets (10 mg total) by mouth 2 (two) times daily for 5 days, then take 1 tablet twice daily.   [DISCONTINUED] atorvastatin (LIPITOR) 40 MG tablet Take 1 tablet (40 mg total) by  mouth daily.   [DISCONTINUED] carvedilol (COREG) 3.125 MG tablet Take 1 tablet (3.125 mg total) by mouth 2 (two) times daily with a meal.     Radiology:   No results found.  Cardiac Studies:   Bilateral lower extremity DVT study: RIGHT:  - There is no evidence of deep vein thrombosis in the lower extremity.  - No cystic structure found in the popliteal fossa.  LEFT:  - Findings consistent with acute deep vein thrombosis involving the left peroneal veins.  - No cystic structure found in the popliteal fossa.     Echocardiogram 08/11/2020: 1. Left ventricular ejection fraction, by estimation, is 40 to 45%. The left ventricle has mildly decreased function. The left ventricle has no regional wall motion abnormalities. There is mild concentric left  ventricular hypertrophy. Left ventricular diastolic parameters are consistent with Grade II diastolic dysfunction (pseudonormalization). Elevated left atrial pressure. There is the interventricular septum is flattened in systole and diastole, consistent with right ventricular pressure and volume  overload.   2. Right ventricular systolic function is normal. The right ventricular size is moderately enlarged.   3. The mitral valve is normal in structure. No evidence of mitral valve regurgitation. No evidence of mitral stenosis.   4. The aortic valve is tricuspid. Aortic valve regurgitation is not visualized. No aortic stenosis is present.   5. The inferior vena cava is normal in size with greater than 50% respiratory variability, suggesting right atrial pressure of 3 mmHg.   EKG:   08/27/2020: Sinus rhythm at a rate of 87 bpm.  Left axis, left anterior fascicular block.  Incomplete right bundle branch block.  Poor R wave progression, cannot exclude anteroseptal infarct old.  Compared to EKG 08/10/2020, rate has improved.  08/10/2020: Sinus tachycardia at a rate of 105 bpm.  Left axis, left anterior fascicular block.  Poor R wave progression, cannot  exclude anteroseptal infarct old.   Assessment     ICD-10-CM   1. Chronic combined systolic and diastolic heart failure (HCC)  I50.42 EKG 12-Lead    PCV ECHOCARDIOGRAM COMPLETE    2. Essential hypertension  I10     3. Type 2 diabetes mellitus without complication, with long-term current use of insulin (HCC)  E11.9    Z79.4     4. Other acute pulmonary embolism with acute cor pulmonale (HCC)  I26.09     5. Acute deep vein thrombosis (DVT) of left peroneal vein (HCC)  I82.452     6. Hypercholesteremia  E78.00        Medications Discontinued During This Encounter  Medication Reason   apixaban (ELIQUIS) 5 MG TABS tablet Error   carvedilol (COREG) 3.125 MG tablet Reorder   atorvastatin (LIPITOR) 40 MG tablet Reorder    Meds ordered this encounter  Medications   atorvastatin (LIPITOR) 40 MG tablet    Sig: Take 1 tablet (40 mg total) by mouth daily.    Dispense:  30 tablet    Refill:  3   carvedilol (COREG) 3.125 MG tablet    Sig: Take 1 tablet (3.125 mg total) by mouth 2 (two) times daily with a meal.    Dispense:  60 tablet    Refill:  3    Recommendations:   Chad Rivas is a 55 y.o. AA male with history of type 2 diabetes, morbid obesity, OSA noncompliant with CPAP, history of DVT after right hip replacement 15 years ago.   Presented to [facility] emergency department 08/11/2020 with progressive shortness of breath, evaluation revealed acute PE and left leg DVT.  He was therefore started on Eliquis and echocardiogram during stay revealed mild systolic dysfunction with LVEF of 45%.  Discussed at length with patient regarding echocardiogram including pathophysiology of systolic and diastolic heart failure.  Counseled patient regarding guideline directed medical therapy indications, risks, and benefits. Given patient's heart rate remains >70 bpm and blood pressure is mildly elevated would not recommend up titration of Coreg as well as addition of Entresto.  However patient is  resistant to medication changes or additions at this time.  Patient states that he will only take the carvedilol at this time and "see what happens".  Patient is aware of the risks of not initiating additional guideline directed medical therapy for heart failure as well as controlling cardiovascular risk factors.  Patient verbalizes he wishes not to any additional medications or higher dose of carvedilol at this time despite known benefits for heart failure.  Will continue current medications and plan to repeat echocardiogram in 3 months. Will defer management of PE and DVT for pulmonology.   Follow-up in 3 months, sooner if needed, for combined heart failure, hypertension, and hyperlipidemia.   Alethia Berthold, PA-C 08/28/2020, 3:51 PM Office: [phone]

## 2020-08-28 ENCOUNTER — Other Ambulatory Visit: Payer: Self-pay

## 2020-08-28 ENCOUNTER — Encounter: Payer: Self-pay | Admitting: Student

## 2020-08-31 ENCOUNTER — Telehealth: Payer: Self-pay

## 2020-08-31 NOTE — Telephone Encounter (Signed)
Claim form faxed to [facility]

## 2020-08-31 NOTE — Telephone Encounter (Signed)
Also mailed to pt

## 2020-09-11 ENCOUNTER — Encounter: Payer: Self-pay | Admitting: Pulmonary Disease

## 2020-09-11 ENCOUNTER — Ambulatory Visit (INDEPENDENT_AMBULATORY_CARE_PROVIDER_SITE_OTHER): Payer: 59 | Admitting: Pulmonary Disease

## 2020-09-11 ENCOUNTER — Other Ambulatory Visit: Payer: Self-pay

## 2020-09-11 VITALS — BP 128/84 | HR 84 | Temp 98.2°F | Ht 71.0 in | Wt 348.0 lb

## 2020-09-11 DIAGNOSIS — I82402 Acute embolism and thrombosis of unspecified deep veins of left lower extremity: Secondary | ICD-10-CM

## 2020-09-11 DIAGNOSIS — I2699 Other pulmonary embolism without acute cor pulmonale: Secondary | ICD-10-CM

## 2020-09-11 NOTE — Progress Notes (Signed)
Synopsis: Referred in July 2022 for hospital follow up due to pulmonary emboli  Subjective:   PATIENT ID: Chad Rivas GENDER: male DOB: Oct 12, 1965, MRN: [id number]   HPI  Chief Complaint  Patient presents with   Hospitalization Follow-up    HFU. States he has been doing well since being discharged. Does have some SOB with exertion but it is not extreme.     Marl Seago is a 55 year old male, never smoker with history of DVT and recent hospital admission for pulmonary emboli who comes to pulmonary clinic for follow up.   He has been doing well since hospital admission.  He was admitted 6/7-6/9.  He was initially treated with heparin drip and then transition to Eliquis therapy which she currently remains on.  He denies any bleeding issues at this time.  Denies any hematuria or bloody bowel movements.  He reports his breathing is much better and he has started to walk for exercise.  He saw a sleep physician yesterday through [facility] and is scheduled for a home sleep study in the near future.  He saw cardiology on 08/27/2020 for combined systolic and diastolic heart failure, note reviewed.  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  Past Medical History:  Diagnosis Date   Diabetes mellitus without complication ([place])    Sleep apnea    pt has CPAP but does not use      No family history on file.   Social History   Socioeconomic History   Marital status: Married    Spouse name: Not on file   Number of children: Not on file   Years of education: Not on file   Highest education level: Not on file  Occupational History   Not on file  Tobacco Use   Smoking status: Never   Smokeless tobacco: Never  Vaping Use   Vaping Use: Never used  Substance and Sexual Activity   Alcohol use: No   Drug use: No   Sexual activity: Not on file  Other Topics Concern   Not on file  Social History Narrative   Not on file   Social  Determinants of Health   Financial Resource Strain: Not on file  Food Insecurity: Not on file  Transportation Needs: Not on file  Physical Activity: Not on file  Stress: Not on file  Social Connections: Not on file  Intimate Partner Violence: Not on file     Allergies  Allergen Reactions   Penicillins Anaphylaxis and Other (See Comments)    Has patient had a PCN reaction causing immediate rash, facial/tongue/throat swelling, SOB or lightheadedness with hypotension: Y Has patient had a PCN reaction causing severe rash involving mucus membranes or skin necrosis: Y Has patient had a PCN reaction that required hospitalization: Y Has patient had a PCN reaction occurring within the last 10 years: N If all of the above answers are "NO", then may proceed with Cephalosporin use.    Canagliflozin Other (See Comments)   Sulfamethoxazole-Trimethoprim Other (See Comments)   Losartan Other (See Comments)    Cause angioedema     Outpatient Medications Prior to Visit  Medication Sig Dispense Refill   apixaban (ELIQUIS) 5 MG TABS tablet Take 1 tablet (5 mg total) by mouth 2 (two) times daily. 60 tablet 0   atorvastatin (LIPITOR) 40 MG tablet Take 1 tablet (40 mg total) by mouth daily. 30 tablet 3   blood glucose meter kit  and supplies KIT Dispense based on patient and insurance preference. Use up to four times daily as directed. (FOR ICD-9 250.00, 250.01). For QAC - HS accuchecks. 1 each 1   carvedilol (COREG) 3.125 MG tablet Take 1 tablet (3.125 mg total) by mouth 2 (two) times daily with a meal. 60 tablet 3   diphenhydrAMINE (BENADRYL) 25 MG tablet Take 1 tablet (25 mg total) by mouth every 6 (six) hours as needed for itching (Rash). 30 tablet 0   EPINEPHrine (EPIPEN 2-PAK) 0.3 mg/0.3 mL IJ SOAJ injection Inject 0.3 mg into the muscle once as needed (For anaphylaxis.).     FERREX 150 150 MG capsule Take 150 mg by mouth daily.   3   fluticasone (FLONASE) 50 MCG/ACT nasal spray Place 1 spray into  both nostrils daily as needed for allergies.   3   glucose blood test strip Use four times daily as directed 100 each 0   insulin glargine (LANTUS SOLOSTAR) 100 UNIT/ML Solostar Pen Inject 0.3 mLs (30 Units total) into the skin at bedtime. 15 mL 0   insulin lispro (HUMALOG) 100 UNIT/ML KwikPen Before each meal 3 times a day, 140-199 - 2 units, 200-250 - 4 units, 251-299 - 6 units,  300-349 - 8 units,  350 or above 10 units. 15 mL 0   Insulin Pen Needle 32G X 4 MM MISC Use for subcutaneous insulin 4 times daily. 100 each 0   Lancet Devices (AUTOLET MINI) MISC Use as directed 1 each 0   Multiple Vitamins-Minerals (MULTIVITAMIN ADULT) TABS Take 1 tablet by mouth daily.     oxymetazoline (AFRIN) 0.05 % nasal spray Place 1 spray into both nostrils 2 (two) times daily as needed for congestion.     TRUEplus Lancets 28G MISC Use as directed four times daily 100 each 0   metFORMIN (GLUCOPHAGE) 500 MG tablet Take 1,000 mg by mouth 2 (two) times daily with a meal. (Patient not taking: Reported on 08/27/2020)     No facility-administered medications prior to visit.    Review of Systems  Constitutional:  Negative for chills, fever, malaise/fatigue and weight loss.  HENT:  Positive for congestion. Negative for sinus pain and sore throat.   Eyes: Negative.   Respiratory:  Positive for cough and shortness of breath. Negative for hemoptysis, sputum production and wheezing.   Cardiovascular:  Negative for chest pain, palpitations, orthopnea, claudication and leg swelling.  Gastrointestinal:  Negative for abdominal pain, heartburn, nausea and vomiting.  Genitourinary: Negative.   Musculoskeletal:  Negative for joint pain and myalgias.  Skin:  Negative for rash.  Neurological:  Negative for weakness.  Endo/Heme/Allergies: Negative.   Psychiatric/Behavioral: Negative.       Objective:   Vitals:   09/11/20 1125  BP: 128/84  Pulse: 84  Temp: 98.2 F (36.8 C)  TempSrc: Oral  SpO2: 97%  Weight: (!) 348  lb (157.9 kg)  Height: 5' 11"  (1.803 m)     Physical Exam Constitutional:      General: He is not in acute distress.    Appearance: He is obese.  HENT:     Head: Normocephalic and atraumatic.  Eyes:     Extraocular Movements: Extraocular movements intact.     Conjunctiva/sclera: Conjunctivae normal.     Pupils: Pupils are equal, round, and reactive to light.  Cardiovascular:     Rate and Rhythm: Normal rate and regular rhythm.     Pulses: Normal pulses.     Heart sounds: Normal heart sounds. No  murmur heard. Pulmonary:     Effort: Pulmonary effort is normal.     Breath sounds: Normal breath sounds. No wheezing, rhonchi or rales.  Abdominal:     General: Bowel sounds are normal.     Palpations: Abdomen is soft.  Musculoskeletal:     Right lower leg: No edema.     Left lower leg: No edema.  Lymphadenopathy:     Cervical: No cervical adenopathy.  Skin:    General: Skin is warm and dry.  Neurological:     General: No focal deficit present.     Mental Status: He is alert.  Psychiatric:        Mood and Affect: Mood normal.        Behavior: Behavior normal.        Thought Content: Thought content normal.        Judgment: Judgment normal.    CBC    Component Value Date/Time   WBC 10.0 08/12/2020 0033   RBC 5.13 08/12/2020 0033   HGB 12.2 (L) 08/12/2020 0033   HCT 40.9 08/12/2020 0033   PLT 289 08/12/2020 0033   MCV 79.7 (L) 08/12/2020 0033   MCH 23.8 (L) 08/12/2020 0033   MCHC 29.8 (L) 08/12/2020 0033   RDW 14.9 08/12/2020 0033   LYMPHSABS 2.7 08/10/2020 1819   MONOABS 0.6 08/10/2020 1819   EOSABS 0.3 08/10/2020 1819   BASOSABS 0.0 08/10/2020 1819   BMP Latest Ref Rng & Units 08/12/2020 08/11/2020 08/10/2020  Glucose 70 - 99 mg/dL 298(H) 332(H) -  BUN 6 - 20 mg/dL 12 13 -  Creatinine 0.61 - 1.24 mg/dL 0.91 0.91 -  Sodium 135 - 145 mmol/L 135 133(L) 136  Potassium 3.5 - 5.1 mmol/L 3.7 3.8 3.9  Chloride 98 - 111 mmol/L 97(L) 96(L) -  CO2 22 - 32 mmol/L 28 29 -   Calcium 8.9 - 10.3 mg/dL 8.6(L) 8.9 -   Chest imaging: CTA Chest 08/10/20 Distal central right main pulmonary artery filling defect extending to the segmental and subsegmental levels of all 3 right lobes. Segmental and subsegmental left upper lobe pulmonary artery filling defects. Cleary subsegmental left lower lobe pulmonary artery filling defects.  Mediastinum/Nodes: No enlarged mediastinal, hilar, or axillary lymph nodes. Thyroid gland, trachea, and esophagus demonstrate no significant findings.   Lungs/Pleura: Stable subpleural left lower lobe nodules (6:87, 99) measuring 8 mm and 6 mm. Lungs are clear. No pleural effusion or pneumothorax.  PFT: No flowsheet data found.  Echo 08/11/20: LVEF 40-45%. Mild concentric hypertrophy. Grade II diastolic dysfunction. RV systolic function is normal. RV size is moderately enlarged.     Assessment & Plan:   Acute pulmonary embolism without acute cor pulmonale, unspecified pulmonary embolism type (HCC)  Acute deep vein thrombosis (DVT) of left lower extremity, unspecified vein (HCC)  Discussion: Dewel Lotter is a 55 year old male, never smoker with DMII, HFrEF and obstructive sleep apnea who comes to pulmonary clinic for hospital follow up for DVT/PE.   He is to continue Eliquis therapy indefinitely for his history of DVT about 15 years ago and his more recent history of DVT and pulmonary emboli in which she was hospitalized for.  We can consider referral to hematology in the future to consider hypercoagulable work-up for further data/information in the decision-making process in regards to his anticoagulation therapy duration.  He is scheduled for a home [facility].  I have requested that this information be faxed to [place] once available.  He is following with cardiology for his heart failure management.  Follow-up in 6 months.  Freda Jackson, MD [facility] Office:  [phone]    Current Outpatient Medications:    apixaban (ELIQUIS) 5 MG TABS tablet, Take 1 tablet (5 mg total) by mouth 2 (two) times daily., Disp: 60 tablet, Rfl: 0   atorvastatin (LIPITOR) 40 MG tablet, Take 1 tablet (40 mg total) by mouth daily., Disp: 30 tablet, Rfl: 3   blood glucose meter kit and supplies KIT, Dispense based on patient and insurance preference. Use up to four times daily as directed. (FOR ICD-9 250.00, 250.01). For QAC - HS accuchecks., Disp: 1 each, Rfl: 1   carvedilol (COREG) 3.125 MG tablet, Take 1 tablet (3.125 mg total) by mouth 2 (two) times daily with a meal., Disp: 60 tablet, Rfl: 3   diphenhydrAMINE (BENADRYL) 25 MG tablet, Take 1 tablet (25 mg total) by mouth every 6 (six) hours as needed for itching (Rash)., Disp: 30 tablet, Rfl: 0   EPINEPHrine (EPIPEN 2-PAK) 0.3 mg/0.3 mL IJ SOAJ injection, Inject 0.3 mg into the muscle once as needed (For anaphylaxis.)., Disp: , Rfl:    FERREX 150 150 MG capsule, Take 150 mg by mouth daily. , Disp: , Rfl: 3   fluticasone (FLONASE) 50 MCG/ACT nasal spray, Place 1 spray into both nostrils daily as needed for allergies. , Disp: , Rfl: 3   glucose blood test strip, Use four times daily as directed, Disp: 100 each, Rfl: 0   insulin glargine (LANTUS SOLOSTAR) 100 UNIT/ML Solostar Pen, Inject 0.3 mLs (30 Units total) into the skin at bedtime., Disp: 15 mL, Rfl: 0   insulin lispro (HUMALOG) 100 UNIT/ML KwikPen, Before each meal 3 times a day, 140-199 - 2 units, 200-250 - 4 units, 251-299 - 6 units,  300-349 - 8 units,  350 or above 10 units., Disp: 15 mL, Rfl: 0   Insulin Pen Needle 32G X 4 MM MISC, Use for subcutaneous insulin 4 times daily., Disp: 100 each, Rfl: 0   Lancet Devices (AUTOLET MINI) MISC, Use as directed, Disp: 1 each, Rfl: 0   Multiple Vitamins-Minerals (MULTIVITAMIN ADULT) TABS, Take 1 tablet by mouth daily., Disp: , Rfl:    oxymetazoline (AFRIN) 0.05 % nasal spray, Place 1 spray into both nostrils 2 (two) times  daily as needed for congestion., Disp: , Rfl:    TRUEplus Lancets 28G MISC, Use as directed four times daily, Disp: 100 each, Rfl: 0

## 2020-09-11 NOTE — Patient Instructions (Signed)
Continue eliquis therapy twice daily. We can consider a hematology referral in the future for further workup to determine any blood clotting abnormalities if needed.   Please send our office the sleep study records when available.

## 2020-10-05 ENCOUNTER — Ambulatory Visit: Payer: 59 | Admitting: Registered"

## 2020-10-07 ENCOUNTER — Encounter: Payer: 59 | Attending: Internal Medicine | Admitting: Nutrition

## 2020-10-07 ENCOUNTER — Other Ambulatory Visit: Payer: Self-pay

## 2020-10-07 DIAGNOSIS — E1165 Type 2 diabetes mellitus with hyperglycemia: Secondary | ICD-10-CM | POA: Diagnosis present

## 2020-10-12 NOTE — Progress Notes (Signed)
Patient was trained on the use of the Dexcom sensors.  This was linked to his phone and to [facility].  He inserted the sensor into this left arm and attached the transmitter.  He was encouraged to read the directions and to call the 800 number if he has any questions.  He agreed to do both and had no final questions for me.

## 2020-10-12 NOTE — Patient Instructions (Signed)
Change sensor every 10 days Change transmitter every 3 months CAll 800 help line if questions

## 2020-11-10 ENCOUNTER — Ambulatory Visit: Payer: 59

## 2020-11-10 ENCOUNTER — Other Ambulatory Visit: Payer: Self-pay

## 2020-11-10 DIAGNOSIS — I5042 Chronic combined systolic (congestive) and diastolic (congestive) heart failure: Secondary | ICD-10-CM

## 2020-11-24 ENCOUNTER — Telehealth: Payer: Self-pay

## 2020-11-25 NOTE — Progress Notes (Addendum)
Primary Physician/Referring:  Josetta Huddle, MD  Patient ID: Chad Rivas, male    DOB: 1965-05-18, 55 y.o.   MRN: [id number]  Chief Complaint  Patient presents with   Heart Failure   Hypertension   Hyperlipidemia   HPI:    Chad Rivas  is a 55 y.o. male with history of type 2 diabetes, morbid obesity, OSA compliant with CPAP, history of DVT after right hip replacement 15 years ago.   Presented to [facility] emergency department 08/11/2020 with progressive shortness of breath, evaluation revealed acute PE and left leg DVT.  He was therefore started on Eliquis and echocardiogram during stay revealed mild systolic dysfunction with LVEF of 45%.  Patient presents for 31-monthfollow-up of combined systolic diastolic heart failure, hypertension, and hyperlipidemia.  Last office visit had recommended titration of Coreg as well as addition of Entresto, however patient was resistant and reported that he will only take carvedilol at this time.  Also ordered repeat echocardiogram which revealed normalized LVEF but 50-55% and grade 2 diastolic dysfunction with moderate LVH.  Overall patient is feeling well without specific complaints today.  He is now on CPAP nightly and reports some blood pressure readings averaging 120/68 mmHg.  He is tolerating anticoagulation without bleeding diathesis and continues to follow with pulmonology for management of PE and DVT.  Per pulmonology's last note recommend indefinite anticoagulation and potential evaluation by hematology for hypercoagulable work-up.  Denies chest pain, palpitations, syncope, near syncope, dizziness, orthopnea, PND.  Past Medical History:  Diagnosis Date   Diabetes mellitus without complication ([place]    Sleep apnea    pt has CPAP but does not use    Past Surgical History:  Procedure Laterality Date   IRRIGATION AND DEBRIDEMENT ABSCESS Right 07/13/2017   Procedure: INCISION AND DRAINAGE OF GROIN ABSCESS;  Surgeon: OKathie Rhodes MD;   Location: [facility];  Service: Urology;  Laterality: Right;   JOINT REPLACEMENT  2009   R hip   TOE SURGERY     TOTAL HIP ARTHROPLASTY     Family History  Problem Relation Age of Onset   Cancer Father     Social History   Tobacco Use   Smoking status: Never   Smokeless tobacco: Never  Substance Use Topics   Alcohol use: No   Marital Status: Married   ROS  Review of Systems  Cardiovascular:  Negative for chest pain, claudication, dyspnea on exertion (improved), leg swelling, near-syncope, orthopnea, palpitations, paroxysmal nocturnal dyspnea and syncope.  Gastrointestinal:  Negative for hematochezia and melena.  Genitourinary:  Negative for hematuria.  Neurological:  Negative for dizziness.   Objective  Blood pressure 121/78, pulse 77, temperature (!) 97.5 F (36.4 C), resp. rate 16, height 5' 11" (1.803 m), weight (!) 343 lb (155.6 kg), SpO2 97 %.  Vitals with BMI 11/27/2020 11/27/2020 09/11/2020  Height - 5' 11" 5' 11"  Weight - 343 lbs 348 lbs  BMI - 483.09440.76 Systolic [id number] Diastolic 78 79 84  Pulse 77 82 84     Physical Exam Vitals reviewed.  Constitutional:      Appearance: He is obese.  Cardiovascular:     Rate and Rhythm: Normal rate and regular rhythm.     Pulses: Intact distal pulses.     Heart sounds: S1 normal and S2 normal. No murmur heard.   No gallop.  Pulmonary:     Effort: Pulmonary effort is normal. No respiratory distress.     Breath  sounds: No wheezing, rhonchi or rales.  Musculoskeletal:     Right lower leg: No edema.     Left lower leg: No edema.  Neurological:     Mental Status: He is alert.  Physical exam unchanged compared to previous.  Laboratory examination:   Recent Labs    08/10/20 1819 08/10/20 1842 08/11/20 0531 08/12/20 0033  NA 133* 136 133* 135  K 3.9 3.9 3.8 3.7  CL 98  --  96* 97*  CO2 25  --  29 28  GLUCOSE 373*  --  332* 298*  BUN 13  --  13 12  CREATININE 1.11  --  0.91 0.91  CALCIUM 9.0  --  8.9 8.6*   GFRNONAA >60  --  >60 >60   CrCl cannot be calculated (Patient's most recent lab result is older than the maximum 21 days allowed.).  CMP Latest Ref Rng & Units 08/12/2020 08/11/2020 08/10/2020  Glucose 70 - 99 mg/dL 298(H) 332(H) -  BUN 6 - 20 mg/dL 12 13 -  Creatinine 0.61 - 1.24 mg/dL 0.91 0.91 -  Sodium 135 - 145 mmol/L 135 133(L) 136  Potassium 3.5 - 5.1 mmol/L 3.7 3.8 3.9  Chloride 98 - 111 mmol/L 97(L) 96(L) -  CO2 22 - 32 mmol/L 28 29 -  Calcium 8.9 - 10.3 mg/dL 8.6(L) 8.9 -  Total Protein 6.5 - 8.1 g/dL - - -  Total Bilirubin 0.3 - 1.2 mg/dL - - -  Alkaline Phos 38 - 126 U/L - - -  AST 15 - 41 U/L - - -  ALT 0 - 44 U/L - - -   CBC Latest Ref Rng & Units 08/12/2020 08/11/2020 08/10/2020  WBC 4.0 - 10.5 K/uL 10.0 10.2 -  Hemoglobin 13.0 - 17.0 g/dL 12.2(L) 12.3(L) 15.0  Hematocrit 39.0 - 52.0 % 40.9 41.5 44.0  Platelets 150 - 400 K/uL 289 299 -    Lipid Panel No results for input(s): CHOL, TRIG, LDLCALC, VLDL, HDL, CHOLHDL, LDLDIRECT in the last 8760 hours.  HEMOGLOBIN A1C Lab Results  Component Value Date   HGBA1C 11.8 (H) 08/11/2020   MPG 292 08/11/2020   TSH No results for input(s): TSH in the last 8760 hours.  External labs:  Will request from PCP  Allergies   Allergies  Allergen Reactions   Penicillins Anaphylaxis and Other (See Comments)    Has patient had a PCN reaction causing immediate rash, facial/tongue/throat swelling, SOB or lightheadedness with hypotension: Y Has patient had a PCN reaction causing severe rash involving mucus membranes or skin necrosis: Y Has patient had a PCN reaction that required hospitalization: Y Has patient had a PCN reaction occurring within the last 10 years: N If all of the above answers are "NO", then may proceed with Cephalosporin use.    Canagliflozin Other (See Comments)   Sulfamethoxazole-Trimethoprim Other (See Comments)   Losartan Other (See Comments)    Cause angioedema    Medications Prior to Visit:   Outpatient  Medications Prior to Visit  Medication Sig Dispense Refill   apixaban (ELIQUIS) 5 MG TABS tablet Take 1 tablet (5 mg total) by mouth 2 (two) times daily. 60 tablet 0   atorvastatin (LIPITOR) 40 MG tablet Take 1 tablet (40 mg total) by mouth daily. 30 tablet 3   blood glucose meter kit and supplies KIT Dispense based on patient and insurance preference. Use up to four times daily as directed. (FOR ICD-9 250.00, 250.01). For QAC - HS accuchecks. 1 each 1  carvedilol (COREG) 3.125 MG tablet Take 1 tablet (3.125 mg total) by mouth 2 (two) times daily with a meal. 60 tablet 3   diphenhydrAMINE (BENADRYL) 25 MG tablet Take 1 tablet (25 mg total) by mouth every 6 (six) hours as needed for itching (Rash). 30 tablet 0   EPINEPHrine (EPIPEN 2-PAK) 0.3 mg/0.3 mL IJ SOAJ injection Inject 0.3 mg into the muscle once as needed (For anaphylaxis.).     FERREX 150 150 MG capsule Take 150 mg by mouth daily.   3   fluticasone (FLONASE) 50 MCG/ACT nasal spray Place 1 spray into both nostrils daily as needed for allergies.   3   glucose blood test strip Use four times daily as directed 100 each 0   insulin glargine (LANTUS SOLOSTAR) 100 UNIT/ML Solostar Pen Inject 0.3 mLs (30 Units total) into the skin at bedtime. 15 mL 0   insulin lispro (HUMALOG) 100 UNIT/ML KwikPen Before each meal 3 times a day, 140-199 - 2 units, 200-250 - 4 units, 251-299 - 6 units,  300-349 - 8 units,  350 or above 10 units. 15 mL 0   Insulin Pen Needle 32G X 4 MM MISC Use for subcutaneous insulin 4 times daily. 100 each 0   Lancet Devices (AUTOLET MINI) MISC Use as directed 1 each 0   loratadine (CLARITIN) 10 MG tablet Take 10 mg by mouth daily.     Multiple Vitamins-Minerals (MULTIVITAMIN ADULT) TABS Take 1 tablet by mouth daily.     oxymetazoline (AFRIN) 0.05 % nasal spray Place 1 spray into both nostrils 2 (two) times daily as needed for congestion.     TRUEplus Lancets 28G MISC Use as directed four times daily 100 each 0   No  facility-administered medications prior to visit.   Final Medications at End of Visit    Current Meds  Medication Sig   apixaban (ELIQUIS) 5 MG TABS tablet Take 1 tablet (5 mg total) by mouth 2 (two) times daily.   atorvastatin (LIPITOR) 40 MG tablet Take 1 tablet (40 mg total) by mouth daily.   blood glucose meter kit and supplies KIT Dispense based on patient and insurance preference. Use up to four times daily as directed. (FOR ICD-9 250.00, 250.01). For QAC - HS accuchecks.   carvedilol (COREG) 3.125 MG tablet Take 1 tablet (3.125 mg total) by mouth 2 (two) times daily with a meal.   diphenhydrAMINE (BENADRYL) 25 MG tablet Take 1 tablet (25 mg total) by mouth every 6 (six) hours as needed for itching (Rash).   EPINEPHrine (EPIPEN 2-PAK) 0.3 mg/0.3 mL IJ SOAJ injection Inject 0.3 mg into the muscle once as needed (For anaphylaxis.).   FERREX 150 150 MG capsule Take 150 mg by mouth daily.    fluticasone (FLONASE) 50 MCG/ACT nasal spray Place 1 spray into both nostrils daily as needed for allergies.    glucose blood test strip Use four times daily as directed   insulin glargine (LANTUS SOLOSTAR) 100 UNIT/ML Solostar Pen Inject 0.3 mLs (30 Units total) into the skin at bedtime.   insulin lispro (HUMALOG) 100 UNIT/ML KwikPen Before each meal 3 times a day, 140-199 - 2 units, 200-250 - 4 units, 251-299 - 6 units,  300-349 - 8 units,  350 or above 10 units.   Insulin Pen Needle 32G X 4 MM MISC Use for subcutaneous insulin 4 times daily.   Lancet Devices (AUTOLET MINI) MISC Use as directed   loratadine (CLARITIN) 10 MG tablet Take 10 mg by mouth  daily.   Multiple Vitamins-Minerals (MULTIVITAMIN ADULT) TABS Take 1 tablet by mouth daily.   oxymetazoline (AFRIN) 0.05 % nasal spray Place 1 spray into both nostrils 2 (two) times daily as needed for congestion.   TRUEplus Lancets 28G MISC Use as directed four times daily   Radiology:   No results found.  Cardiac Studies:   Bilateral lower  extremity DVT study 08/11/2020: RIGHT:  - There is no evidence of deep vein thrombosis in the lower extremity.  - No cystic structure found in the popliteal fossa.  LEFT:  - Findings consistent with acute deep vein thrombosis involving the left peroneal veins.  - No cystic structure found in the popliteal fossa.     Echocardiogram 08/11/2020: 1. Left ventricular ejection fraction, by estimation, is 40 to 45%. The left ventricle has mildly decreased function. The left ventricle has no regional wall motion abnormalities. There is mild concentric left  ventricular hypertrophy. Left ventricular diastolic parameters are consistent with Grade II diastolic dysfunction (pseudonormalization). Elevated left atrial pressure. There is the interventricular septum is flattened in systole and diastole, consistent with right ventricular pressure and volume  overload.   2. Right ventricular systolic function is normal. The right ventricular size is moderately enlarged.   3. The mitral valve is normal in structure. No evidence of mitral valve regurgitation. No evidence of mitral stenosis.   4. The aortic valve is tricuspid. Aortic valve regurgitation is not visualized. No aortic stenosis is present.   5. The inferior vena cava is normal in size with greater than 50% respiratory variability, suggesting right atrial pressure of 3 mmHg.   PCV ECHOCARDIOGRAM COMPLETE 11/10/2020 Left ventricle cavity is normal in size. Moderate concentric hypertrophy of the left ventricle. Normal global wall motion. Normal LV systolic function with visual EF 50-55%. Doppler evidence of grade II (pseudonormal) diastolic dysfunction, elevated LAP. No significant vlavular abnormality. Normal right atrial pressure. Compared to echocardiogram 08/11/2020: LVEF improved from 40-45%, LVH was mild, no evidence of previously noted RV overload.   EKG:   08/27/2020: Sinus rhythm at a rate of 87 bpm.  Left axis, left anterior fascicular block.   Incomplete right bundle branch block.  Poor R wave progression, cannot exclude anteroseptal infarct old.  Compared to EKG 08/10/2020, rate has improved.  08/10/2020: Sinus tachycardia at a rate of 105 bpm.  Left axis, left anterior fascicular block.  Poor R wave progression, cannot exclude anteroseptal infarct old.   Assessment     ICD-10-CM   1. Chronic combined systolic and diastolic heart failure (HCC)  I50.42     2. Essential hypertension  I10     3. Hypercholesteremia  E78.00       There are no discontinued medications.   No orders of the defined types were placed in this encounter.   Recommendations:   AQUILA DELAUGHTER is a 55 y.o. AA male with history of type 2 diabetes, morbid obesity, OSA noncompliant with CPAP, history of DVT after right hip replacement 15 years ago.   Presented to [facility] emergency department 08/11/2020 with progressive shortness of breath, evaluation revealed acute PE and left leg DVT.  He was therefore started on Eliquis and echocardiogram during stay revealed mild systolic dysfunction with LVEF of 45%.  Patient presents for 12-monthfollow-up of combined systolic diastolic heart failure, hypertension, and hyperlipidemia.  Last office visit had recommended titration of Coreg as well as addition of Entresto, however patient was resistant and reported that he will only take carvedilol at this  time.  Also ordered repeat echocardiogram which revealed normal LVEF but 50-55% and grade 2 diastolic dysfunction with moderate LVH.  Reviewed and discussed results of echocardiogram with patient, details above.  Given that patient's LVEF has normalized we will hold off on further titration of guideline directed medical therapy for systolic heart failure.  However again discussed with patient recommendations regarding diastolic heart failure.  Patient remains resistant to up titration of guideline directed medical therapy.  Patient's blood pressure is well controlled.  We will  obtain recent labs from PCP to evaluate control of other cardiovascular risk factors including lipids and A1c.  Will defer further management of PE/DVT and anticoagulation to pulmonology.  No changes are made to patient's medications at this time.  Again discussed with patient the importance of diet and lifestyle modifications, particularly weight loss.  He is congratulated on 5 pound weight loss since July.  Otherwise patient is stable from a cardiovascular standpoint.  Follow-up in 6 months, sooner if needed, for heart failure, hypertension, hyperlipidemia.   Alethia Berthold, PA-C 11/27/2020, 10:52 AM Office: [phone] Office: [phone]  Addendum 11/27/2020: External labs 09/23/2020: Glucose 241, creatinine 0.79, GFR >60, sodium 137, potassium 3.8, BUN 16, Hgb 12.2, HCT 39.5, MCV 75.4, platelet 358 A1c 12.4% total cholesterol 73, triglycerides 30 HDL 27, LDL 30  I personally reviewed external labs, lipids are well controlled therefore will not make changes at this time. However, diabetes remains uncontrolled, will defer management to PCP.    Alethia Berthold, PA-C 11/27/2020, 11:14 AM Office: [phone]

## 2020-11-27 ENCOUNTER — Encounter: Payer: Self-pay | Admitting: Student

## 2020-11-27 ENCOUNTER — Ambulatory Visit: Payer: 59 | Admitting: Student

## 2020-11-27 ENCOUNTER — Other Ambulatory Visit: Payer: Self-pay

## 2020-11-27 VITALS — BP 121/78 | HR 77 | Temp 97.5°F | Resp 16 | Ht 71.0 in | Wt 343.0 lb

## 2020-11-27 DIAGNOSIS — I10 Essential (primary) hypertension: Secondary | ICD-10-CM

## 2020-11-27 DIAGNOSIS — I2609 Other pulmonary embolism with acute cor pulmonale: Secondary | ICD-10-CM

## 2020-11-27 DIAGNOSIS — I5042 Chronic combined systolic (congestive) and diastolic (congestive) heart failure: Secondary | ICD-10-CM

## 2020-11-27 DIAGNOSIS — E7800 Pure hypercholesterolemia, unspecified: Secondary | ICD-10-CM

## 2020-12-26 ENCOUNTER — Other Ambulatory Visit: Payer: Self-pay | Admitting: Student

## 2021-03-21 ENCOUNTER — Other Ambulatory Visit: Payer: Self-pay | Admitting: Student

## 2021-06-11 ENCOUNTER — Other Ambulatory Visit: Payer: Self-pay | Admitting: Student

## 2021-09-27 ENCOUNTER — Other Ambulatory Visit: Payer: Self-pay | Admitting: Student

## 2022-01-10 ENCOUNTER — Other Ambulatory Visit: Payer: Self-pay

## 2022-01-10 MED ORDER — ATORVASTATIN CALCIUM 40 MG PO TABS: 40.0000 mg | ORAL_TABLET | Freq: Every day | ORAL | 0 refills | Status: AC

## 2022-01-15 ENCOUNTER — Inpatient Hospital Stay (HOSPITAL_COMMUNITY): Payer: 59 | Admitting: Certified Registered Nurse Anesthetist

## 2022-01-15 ENCOUNTER — Emergency Department (HOSPITAL_COMMUNITY): Payer: 59

## 2022-01-15 ENCOUNTER — Other Ambulatory Visit: Payer: Self-pay

## 2022-01-15 ENCOUNTER — Inpatient Hospital Stay (HOSPITAL_COMMUNITY)
Admission: EM | Admit: 2022-01-15 | Discharge: 2022-01-18 | DRG: 854 | Disposition: A | Discharge status: Home / Self Care | Payer: 59 | Attending: Family Medicine | Admitting: Family Medicine

## 2022-01-15 ENCOUNTER — Encounter (HOSPITAL_COMMUNITY): Payer: Self-pay

## 2022-01-15 ENCOUNTER — Encounter (HOSPITAL_COMMUNITY)
Admission: EM | Disposition: A | Discharge status: Home / Self Care | Payer: Self-pay | Source: Home / Self Care | Attending: Family Medicine

## 2022-01-15 DIAGNOSIS — L03314 Cellulitis of groin: Secondary | ICD-10-CM | POA: Diagnosis present

## 2022-01-15 DIAGNOSIS — E871 Hypo-osmolality and hyponatremia: Secondary | ICD-10-CM | POA: Diagnosis present

## 2022-01-15 DIAGNOSIS — Z7901 Long term (current) use of anticoagulants: Secondary | ICD-10-CM | POA: Diagnosis not present

## 2022-01-15 DIAGNOSIS — N492 Inflammatory disorders of scrotum: Secondary | ICD-10-CM | POA: Diagnosis present

## 2022-01-15 DIAGNOSIS — G4733 Obstructive sleep apnea (adult) (pediatric): Secondary | ICD-10-CM | POA: Insufficient documentation

## 2022-01-15 DIAGNOSIS — Z96641 Presence of right artificial hip joint: Secondary | ICD-10-CM | POA: Diagnosis present

## 2022-01-15 DIAGNOSIS — Z79899 Other long term (current) drug therapy: Secondary | ICD-10-CM

## 2022-01-15 DIAGNOSIS — Z86711 Personal history of pulmonary embolism: Secondary | ICD-10-CM | POA: Insufficient documentation

## 2022-01-15 DIAGNOSIS — Z794 Long term (current) use of insulin: Secondary | ICD-10-CM | POA: Diagnosis not present

## 2022-01-15 DIAGNOSIS — N493 Fournier gangrene: Secondary | ICD-10-CM | POA: Insufficient documentation

## 2022-01-15 DIAGNOSIS — D509 Iron deficiency anemia, unspecified: Secondary | ICD-10-CM | POA: Diagnosis present

## 2022-01-15 DIAGNOSIS — B950 Streptococcus, group A, as the cause of diseases classified elsewhere: Secondary | ICD-10-CM | POA: Diagnosis present

## 2022-01-15 DIAGNOSIS — E119 Type 2 diabetes mellitus without complications: Secondary | ICD-10-CM | POA: Diagnosis present

## 2022-01-15 DIAGNOSIS — Z86718 Personal history of other venous thrombosis and embolism: Secondary | ICD-10-CM | POA: Diagnosis not present

## 2022-01-15 DIAGNOSIS — Z6841 Body Mass Index (BMI) 40.0 and over, adult: Secondary | ICD-10-CM

## 2022-01-15 DIAGNOSIS — R110 Nausea: Secondary | ICD-10-CM | POA: Diagnosis present

## 2022-01-15 DIAGNOSIS — G4730 Sleep apnea, unspecified: Secondary | ICD-10-CM | POA: Diagnosis present

## 2022-01-15 DIAGNOSIS — Z882 Allergy status to sulfonamides status: Secondary | ICD-10-CM | POA: Diagnosis not present

## 2022-01-15 DIAGNOSIS — I509 Heart failure, unspecified: Secondary | ICD-10-CM

## 2022-01-15 DIAGNOSIS — L02214 Cutaneous abscess of groin: Secondary | ICD-10-CM | POA: Diagnosis present

## 2022-01-15 DIAGNOSIS — E876 Hypokalemia: Secondary | ICD-10-CM | POA: Diagnosis present

## 2022-01-15 DIAGNOSIS — Z880 Allergy status to penicillin: Secondary | ICD-10-CM

## 2022-01-15 DIAGNOSIS — Z888 Allergy status to other drugs, medicaments and biological substances status: Secondary | ICD-10-CM | POA: Diagnosis not present

## 2022-01-15 DIAGNOSIS — A419 Sepsis, unspecified organism: Principal | ICD-10-CM | POA: Diagnosis present

## 2022-01-15 DIAGNOSIS — R6510 Systemic inflammatory response syndrome (SIRS) of non-infectious origin without acute organ dysfunction: Secondary | ICD-10-CM | POA: Insufficient documentation

## 2022-01-15 DIAGNOSIS — G5730 Lesion of lateral popliteal nerve, unspecified lower limb: Secondary | ICD-10-CM | POA: Diagnosis not present

## 2022-01-15 HISTORY — PX: IRRIGATION AND DEBRIDEMENT ABSCESS: SHX5252

## 2022-01-15 HISTORY — DX: Personal history of pulmonary embolism: Z86.711

## 2022-01-15 LAB — IRON AND TIBC
Iron: 16 ug/dL — ABNORMAL LOW (ref 45–182)
Saturation Ratios: 8 % — ABNORMAL LOW (ref 17.9–39.5)
TIBC: 199 ug/dL — ABNORMAL LOW (ref 250–450)
UIBC: 183 ug/dL

## 2022-01-15 LAB — CBC WITH DIFFERENTIAL/PLATELET
Abs Immature Granulocytes: 0.17 10*3/uL — ABNORMAL HIGH (ref 0.00–0.07)
Basophils Absolute: 0 10*3/uL (ref 0.0–0.1)
Basophils Relative: 0 %
Eosinophils Absolute: 0 10*3/uL (ref 0.0–0.5)
Eosinophils Relative: 0 %
HCT: 36 % — ABNORMAL LOW (ref 39.0–52.0)
Hemoglobin: 11.2 g/dL — ABNORMAL LOW (ref 13.0–17.0)
Immature Granulocytes: 1 %
Lymphocytes Relative: 9 %
Lymphs Abs: 1.9 10*3/uL (ref 0.7–4.0)
MCH: 23.8 pg — ABNORMAL LOW (ref 26.0–34.0)
MCHC: 31.1 g/dL (ref 30.0–36.0)
MCV: 76.6 fL — ABNORMAL LOW (ref 80.0–100.0)
Monocytes Absolute: 1.7 10*3/uL — ABNORMAL HIGH (ref 0.1–1.0)
Monocytes Relative: 8 %
Neutro Abs: 17.4 10*3/uL — ABNORMAL HIGH (ref 1.7–7.7)
Neutrophils Relative %: 82 %
Platelets: 314 10*3/uL (ref 150–400)
RBC: 4.7 MIL/uL (ref 4.22–5.81)
RDW: 14.9 % (ref 11.5–15.5)
WBC: 21.3 10*3/uL — ABNORMAL HIGH (ref 4.0–10.5)
nRBC: 0 % (ref 0.0–0.2)

## 2022-01-15 LAB — COMPREHENSIVE METABOLIC PANEL
ALT: 21 U/L (ref 0–44)
AST: 22 U/L (ref 15–41)
Albumin: 2.9 g/dL — ABNORMAL LOW (ref 3.5–5.0)
Alkaline Phosphatase: 91 U/L (ref 38–126)
Anion gap: 9 (ref 5–15)
BUN: 13 mg/dL (ref 6–20)
CO2: 27 mmol/L (ref 22–32)
Calcium: 8.1 mg/dL — ABNORMAL LOW (ref 8.9–10.3)
Chloride: 94 mmol/L — ABNORMAL LOW (ref 98–111)
Creatinine, Ser: 1.03 mg/dL (ref 0.61–1.24)
GFR, Estimated: >60 mL/min (ref >60)
Glucose, Bld: 335 mg/dL — ABNORMAL HIGH (ref 70–99)
Potassium: 2.9 mmol/L — ABNORMAL LOW (ref 3.5–5.1)
Sodium: 130 mmol/L — ABNORMAL LOW (ref 135–145)
Total Bilirubin: 3.3 mg/dL — ABNORMAL HIGH (ref 0.3–1.2)
Total Protein: 8.5 g/dL — ABNORMAL HIGH (ref 6.5–8.1)

## 2022-01-15 LAB — URINALYSIS, ROUTINE W REFLEX MICROSCOPIC
Bacteria, UA: NONE SEEN
Bilirubin Urine: NEGATIVE
Glucose, UA: >=500 mg/dL — AB
Ketones, ur: 5 mg/dL — AB
Leukocytes,Ua: NEGATIVE
Nitrite: NEGATIVE
Protein, ur: 100 mg/dL — AB
Specific Gravity, Urine: 1.027 (ref 1.005–1.030)
pH: 6 (ref 5.0–8.0)

## 2022-01-15 LAB — HEMOGLOBIN A1C
Hgb A1c MFr Bld: 11.6 % — ABNORMAL HIGH (ref 4.8–5.6)
Mean Plasma Glucose: 286.22 mg/dL

## 2022-01-15 LAB — LACTIC ACID, PLASMA
Lactic Acid, Venous: 1.5 mmol/L (ref 0.5–1.9)
Lactic Acid, Venous: 1.6 mmol/L (ref 0.5–1.9)

## 2022-01-15 LAB — GLUCOSE, CAPILLARY
Glucose-Capillary: 271 mg/dL — ABNORMAL HIGH (ref 70–99)
Glucose-Capillary: 244 mg/dL — ABNORMAL HIGH (ref 70–99)
Glucose-Capillary: 246 mg/dL — ABNORMAL HIGH (ref 70–99)

## 2022-01-15 LAB — PROTIME-INR
INR: 1.5 — ABNORMAL HIGH (ref 0.8–1.2)
Prothrombin Time: 18.1 seconds — ABNORMAL HIGH (ref 11.4–15.2)

## 2022-01-15 LAB — APTT: aPTT: 38 seconds — ABNORMAL HIGH (ref 24–36)

## 2022-01-15 SURGERY — IRRIGATION AND DEBRIDEMENT ABSCESS
Anesthesia: General

## 2022-01-15 MED ORDER — PROPOFOL 10 MG/ML IV BOLUS
INTRAVENOUS | Status: AC
  Filled 2022-01-15: qty 20

## 2022-01-15 MED ORDER — OXYCODONE HCL 5 MG/5ML PO SOLN: 5.0000 mg | Freq: Once | ORAL | Status: DC | PRN

## 2022-01-15 MED ORDER — SUCCINYLCHOLINE CHLORIDE 200 MG/10ML IV SOSY
PREFILLED_SYRINGE | INTRAVENOUS | Status: DC | PRN
  Administered 2022-01-15: 160 mg via INTRAVENOUS

## 2022-01-15 MED ORDER — AMISULPRIDE (ANTIEMETIC) 5 MG/2ML IV SOLN: 10.0000 mg | Freq: Once | INTRAVENOUS | Status: DC | PRN

## 2022-01-15 MED ORDER — FENTANYL CITRATE (PF) 100 MCG/2ML IJ SOLN
INTRAMUSCULAR | Status: DC | PRN
  Administered 2022-01-15: 50 ug via INTRAVENOUS
  Administered 2022-01-15: 100 ug via INTRAVENOUS

## 2022-01-15 MED ORDER — LACTATED RINGERS IV BOLUS
2000.0000 mL | Freq: Once | INTRAVENOUS | Status: AC
  Administered 2022-01-15: 2000 mL via INTRAVENOUS

## 2022-01-15 MED ORDER — PROPOFOL 10 MG/ML IV BOLUS
INTRAVENOUS | Status: DC | PRN
  Administered 2022-01-15: 200 mg via INTRAVENOUS

## 2022-01-15 MED ORDER — LIDOCAINE HCL (PF) 2 % IJ SOLN
INTRAMUSCULAR | Status: AC
  Filled 2022-01-15: qty 5

## 2022-01-15 MED ORDER — ONDANSETRON HCL 4 MG/2ML IJ SOLN
INTRAMUSCULAR | Status: AC
  Filled 2022-01-15: qty 2

## 2022-01-15 MED ORDER — POTASSIUM CHLORIDE 10 MEQ/100ML IV SOLN
10.0000 meq | INTRAVENOUS | Status: AC
  Administered 2022-01-15 (×2): 10 meq via INTRAVENOUS
  Filled 2022-01-15 (×4): qty 100

## 2022-01-15 MED ORDER — METRONIDAZOLE 500 MG/100ML IV SOLN
500.0000 mg | Freq: Two times a day (BID) | INTRAVENOUS | Status: DC
  Administered 2022-01-16 – 2022-01-18 (×5): 500 mg via INTRAVENOUS
  Filled 2022-01-15 (×5): qty 100

## 2022-01-15 MED ORDER — ONDANSETRON HCL 4 MG/2ML IJ SOLN
INTRAMUSCULAR | Status: DC | PRN
  Administered 2022-01-15: 4 mg via INTRAVENOUS

## 2022-01-15 MED ORDER — FENTANYL CITRATE (PF) 250 MCG/5ML IJ SOLN
INTRAMUSCULAR | Status: AC
  Filled 2022-01-15: qty 5

## 2022-01-15 MED ORDER — LINEZOLID 600 MG/300ML IV SOLN
600.0000 mg | Freq: Once | INTRAVENOUS | Status: AC
  Administered 2022-01-15: 600 mg via INTRAVENOUS
  Filled 2022-01-15: qty 300

## 2022-01-15 MED ORDER — LACTATED RINGERS IV SOLN: INTRAVENOUS | Status: DC | PRN

## 2022-01-15 MED ORDER — MIDAZOLAM HCL 2 MG/2ML IJ SOLN
INTRAMUSCULAR | Status: AC
  Filled 2022-01-15: qty 2

## 2022-01-15 MED ORDER — INSULIN ASPART 100 UNIT/ML IJ SOLN
INTRAMUSCULAR | Status: AC
  Filled 2022-01-15: qty 1

## 2022-01-15 MED ORDER — SODIUM CHLORIDE 0.9 % IR SOLN
Status: DC | PRN
  Administered 2022-01-15: 1000 mL

## 2022-01-15 MED ORDER — HYDROMORPHONE HCL 1 MG/ML IJ SOLN: 0.2500 mg | INTRAMUSCULAR | Status: DC | PRN

## 2022-01-15 MED ORDER — INSULIN GLARGINE-YFGN 100 UNIT/ML ~~LOC~~ SOLN
20.0000 [IU] | Freq: Every day | SUBCUTANEOUS | Status: DC
  Administered 2022-01-15 – 2022-01-16 (×2): 20 [IU] via SUBCUTANEOUS
  Filled 2022-01-15 (×2): qty 0.2

## 2022-01-15 MED ORDER — PROCHLORPERAZINE EDISYLATE 10 MG/2ML IJ SOLN: 10.0000 mg | Freq: Four times a day (QID) | INTRAMUSCULAR | Status: DC | PRN

## 2022-01-15 MED ORDER — LINEZOLID 600 MG/300ML IV SOLN
600.0000 mg | Freq: Two times a day (BID) | INTRAVENOUS | Status: DC
  Administered 2022-01-16 – 2022-01-18 (×5): 600 mg via INTRAVENOUS
  Filled 2022-01-15 (×6): qty 300

## 2022-01-15 MED ORDER — OXYCODONE HCL 5 MG PO TABS: 5.0000 mg | ORAL_TABLET | Freq: Once | ORAL | Status: DC | PRN

## 2022-01-15 MED ORDER — MIDAZOLAM HCL 5 MG/5ML IJ SOLN
INTRAMUSCULAR | Status: DC | PRN
  Administered 2022-01-15: 2 mg via INTRAVENOUS

## 2022-01-15 MED ORDER — MORPHINE SULFATE (PF) 4 MG/ML IV SOLN
4.0000 mg | INTRAVENOUS | Status: DC | PRN
  Administered 2022-01-15 – 2022-01-18 (×3): 4 mg via INTRAVENOUS
  Filled 2022-01-15 (×3): qty 1

## 2022-01-15 MED ORDER — ONDANSETRON HCL 4 MG/2ML IJ SOLN: 4.0000 mg | Freq: Once | INTRAMUSCULAR | Status: DC | PRN

## 2022-01-15 MED ORDER — DEXAMETHASONE SODIUM PHOSPHATE 10 MG/ML IJ SOLN
INTRAMUSCULAR | Status: DC | PRN
  Administered 2022-01-15: 5 mg via INTRAVENOUS

## 2022-01-15 MED ORDER — ONDANSETRON HCL 4 MG/2ML IJ SOLN
4.0000 mg | Freq: Once | INTRAMUSCULAR | Status: AC
  Administered 2022-01-15: 4 mg via INTRAVENOUS
  Filled 2022-01-15: qty 2

## 2022-01-15 MED ORDER — CIPROFLOXACIN IN D5W 400 MG/200ML IV SOLN
400.0000 mg | Freq: Once | INTRAVENOUS | Status: AC
  Administered 2022-01-15: 400 mg via INTRAVENOUS
  Filled 2022-01-15: qty 200

## 2022-01-15 MED ORDER — PHENOL 1.4 % MT LIQD
1.0000 | OROMUCOSAL | Status: DC | PRN
  Administered 2022-01-15: 1 via OROMUCOSAL
  Filled 2022-01-15: qty 177

## 2022-01-15 MED ORDER — POTASSIUM CHLORIDE 10 MEQ/100ML IV SOLN
10.0000 meq | INTRAVENOUS | Status: AC
  Administered 2022-01-15 (×2): 10 meq via INTRAVENOUS

## 2022-01-15 MED ORDER — LIDOCAINE 2% (20 MG/ML) 5 ML SYRINGE
INTRAMUSCULAR | Status: DC | PRN
  Administered 2022-01-15: 800 mg via INTRAVENOUS

## 2022-01-15 MED ORDER — INSULIN ASPART 100 UNIT/ML IJ SOLN
0.0000 [IU] | Freq: Three times a day (TID) | INTRAMUSCULAR | Status: DC
  Administered 2022-01-15: 7 [IU] via SUBCUTANEOUS
  Administered 2022-01-16 (×3): 15 [IU] via SUBCUTANEOUS
  Administered 2022-01-17: 11 [IU] via SUBCUTANEOUS
  Administered 2022-01-17: 20 [IU] via SUBCUTANEOUS
  Administered 2022-01-17: 8 [IU] via SUBCUTANEOUS
  Administered 2022-01-18: 4 [IU] via SUBCUTANEOUS
  Administered 2022-01-18: 7 [IU] via SUBCUTANEOUS
  Filled 2022-01-15: qty 0.2

## 2022-01-15 MED ORDER — METRONIDAZOLE 500 MG/100ML IV SOLN
500.0000 mg | Freq: Once | INTRAVENOUS | Status: AC
  Administered 2022-01-15: 500 mg via INTRAVENOUS
  Filled 2022-01-15: qty 100

## 2022-01-15 MED ORDER — IOHEXOL 300 MG/ML  SOLN
100.0000 mL | Freq: Once | INTRAMUSCULAR | Status: AC | PRN
  Administered 2022-01-15: 100 mL via INTRAVENOUS

## 2022-01-15 MED ORDER — CIPROFLOXACIN IN D5W 400 MG/200ML IV SOLN
400.0000 mg | Freq: Two times a day (BID) | INTRAVENOUS | Status: DC
  Administered 2022-01-16 – 2022-01-18 (×5): 400 mg via INTRAVENOUS
  Filled 2022-01-15 (×5): qty 200

## 2022-01-15 MED ORDER — SODIUM CHLORIDE 0.9 % IV SOLN: INTRAVENOUS | Status: DC

## 2022-01-15 MED ORDER — INSULIN ASPART 100 UNIT/ML IJ SOLN
0.0000 [IU] | Freq: Every day | INTRAMUSCULAR | Status: DC
  Administered 2022-01-15: 2 [IU] via SUBCUTANEOUS
  Administered 2022-01-16: 5 [IU] via SUBCUTANEOUS
  Filled 2022-01-15: qty 0.05

## 2022-01-15 SURGICAL SUPPLY — 37 items
BAG COUNTER SPONGE SURGICOUNT (BAG) IMPLANT
BENZOIN TINCTURE PRP APPL 2/3 (GAUZE/BANDAGES/DRESSINGS) IMPLANT
BLADE HEX COATED 2.75 (ELECTRODE) ×2 IMPLANT
BLADE SURG 15 STRL LF DISP TIS (BLADE) ×2 IMPLANT
BLADE SURG 15 STRL SS (BLADE) ×2
BLADE SURG SZ10 CARB STEEL (BLADE) ×2 IMPLANT
BNDG GAUZE DERMACEA FLUFF 4 (GAUZE/BANDAGES/DRESSINGS) IMPLANT
COVER SURGICAL LIGHT HANDLE (MISCELLANEOUS) ×1 IMPLANT
DRAPE LAPAROSCOPIC ABDOMINAL (DRAPES) IMPLANT
DRAPE LAPAROTOMY T 102X78X121 (DRAPES) IMPLANT
DRAPE LAPAROTOMY TRNSV 102X78 (DRAPES) IMPLANT
DRAPE POUCH INSTRU U-SHP 10X18 (DRAPES) ×1 IMPLANT
DRAPE SHEET LG 3/4 BI-LAMINATE (DRAPES) IMPLANT
ELECT REM PT RETURN 15FT ADLT (MISCELLANEOUS) ×2 IMPLANT
EVACUATOR SILICONE 100CC (DRAIN) IMPLANT
GAUZE PAD ABD 8X10 STRL (GAUZE/BANDAGES/DRESSINGS) IMPLANT
GAUZE SPONGE 4X4 12PLY STRL (GAUZE/BANDAGES/DRESSINGS) ×2 IMPLANT
GLOVE BIOGEL PI IND STRL 7.0 (GLOVE) ×1 IMPLANT
GOWN STRL REUS W/ TWL XL LVL3 (GOWN DISPOSABLE) ×2 IMPLANT
GOWN STRL REUS W/TWL XL LVL3 (GOWN DISPOSABLE) ×2
KIT BASIN OR (CUSTOM PROCEDURE TRAY) ×2 IMPLANT
KIT TURNOVER KIT A (KITS) IMPLANT
NDL HYPO 25X1 1.5 SAFETY (NEEDLE) ×2 IMPLANT
NEEDLE HYPO 25X1 1.5 SAFETY (NEEDLE) ×2 IMPLANT
NS IRRIG 1000ML POUR BTL (IV SOLUTION) ×2 IMPLANT
PACK BASIC VI WITH GOWN DISP (CUSTOM PROCEDURE TRAY) ×2 IMPLANT
PENCIL SMOKE EVACUATOR (MISCELLANEOUS) IMPLANT
SOL PREP POV-IOD 4OZ 10% (MISCELLANEOUS) ×1 IMPLANT
SPIKE FLUID TRANSFER (MISCELLANEOUS) IMPLANT
SPONGE T-LAP 4X18 ~~LOC~~+RFID (SPONGE) ×2 IMPLANT
STAPLER VISISTAT 35W (STAPLE) IMPLANT
STRIP CLOSURE SKIN 1/2X4 (GAUZE/BANDAGES/DRESSINGS) IMPLANT
SUT MNCRL AB 4-0 PS2 18 (SUTURE) IMPLANT
SUT VIC AB 3-0 SH 18 (SUTURE) IMPLANT
SYR CONTROL 10ML LL (SYRINGE) ×2 IMPLANT
TOWEL OR 17X26 10 PK STRL BLUE (TOWEL DISPOSABLE) ×1 IMPLANT
WATER STERILE IRR 1000ML POUR (IV SOLUTION) ×1 IMPLANT

## 2022-01-15 NOTE — ED Notes (Signed)
Pt in bed, pt reports that he is comfortable with his pain level, states that his nausea is a little bit better. Has no requests at this time

## 2022-01-15 NOTE — ED Provider Triage Note (Signed)
Emergency Medicine Provider Triage Evaluation Note  Chad Rivas , a 56 y.o. male  was evaluated in triage.  Pt complains of right groin swelling and pain, gradually improving over the past 5 days.  Patient states that he has had a history of Fournier's gangrene in the left groin requiring surgery.  States that this feels similar but it is on the other side.  He denies any documented fevers but has had chills.  He has had decreased appetite over the past 4 days without vomiting.  Has had nausea.  No dysuria.  States that his blood sugars have been running in the 200s.  Review of Systems  Positive: Groin pain, swelling, chills Negative: Vomiting  Physical Exam  BP (!) 148/84 (BP Location: Left Arm)   Pulse (!) 121   Temp 98.5 F (36.9 C) (Oral)   Resp 18   Ht 5\' 10"  (1.778 m)   Wt (!) 158.8 kg   SpO2 91%   BMI 50.22 kg/m  Gen:   Awake, no distress   Resp:  Normal effort  MSK:   Moves extremities without difficulty  Other:  Limited exam in triage room however patient does have fullness to the right inguinal area without any drainage, no crepitus, states that swelling extends beneath the scrotum; tachycardia  Medical Decision Making  Medically screening exam initiated at 11:36 AM.  Appropriate orders placed.  was informed that the remainder of the evaluation will be completed by another provider, this initial triage assessment does not replace that evaluation, and the importance of remaining in the ED until their evaluation is complete.  Sepsis work-up initiated, RN aware that patient needs IV and expedited CT imaging   Chad Ferguson, PA-C 01/15/22 1149

## 2022-01-15 NOTE — Anesthesia Procedure Notes (Signed)
Procedure Name: Intubation Date/Time: 01/15/2022 6:53 PM  Performed by: Gean Maidens, CRNAPre-anesthesia Checklist: Patient identified, Emergency Drugs available, Suction available, Patient being monitored and Timeout performed Patient Re-evaluated:Patient Re-evaluated prior to induction Oxygen Delivery Method: Circle system utilized Preoxygenation: Pre-oxygenation with 100% oxygen Induction Type: IV induction Ventilation: Mask ventilation without difficulty Laryngoscope Size: Mac Tube type: Oral Tube size: 7.5 mm Number of attempts: 1 Airway Equipment and Method: Stylet Placement Confirmation: ETT inserted through vocal cords under direct vision and positive ETCO2 Secured at: 23 cm Tube secured with: Tape Dental Injury: Teeth and Oropharynx as per pre-operative assessment

## 2022-01-15 NOTE — ED Notes (Signed)
Pt in bed, pt states that he has some swelling to his scrotum, states that he has had the swelling since Monday, pt states that he has had gangrene in the past and is worried this is the same.  Assessment deferred to provider, pt moving all extremities. Resps even and unlabored

## 2022-01-15 NOTE — ED Notes (Signed)
OR called for report, report given, pt awaits transport to PACU

## 2022-01-15 NOTE — Sepsis Progress Note (Signed)
Elink following code sepsis °

## 2022-01-15 NOTE — ED Triage Notes (Signed)
Patient said since Monday he has had pain from his right groin to his scrotum. Scrotum is swollen. He stated that he had fasciitis gangrene in the past on the other groin in the past and that it feels the same. Constant nausea.

## 2022-01-15 NOTE — ED Notes (Signed)
Pt in bed, pt states that he has no requests before going to OR, pt transported to OR.

## 2022-01-15 NOTE — Progress Notes (Addendum)
A consult was received from an ED physician for cipro/flagyl per pharmacy dosing.  The patient's profile has been reviewed for ht/wt/allergies/indication/available labs.   A one time order has been placed for cipro 400mg  IV and Flagyl 500mg  IV.  Further antibiotics/pharmacy consults should be ordered by admitting physician if indicated.                       Thank you, 01/15/2022  2:57 PM

## 2022-01-15 NOTE — ED Provider Notes (Signed)
[facility] DEPT Provider Note   CSN: [id number] Arrival date & time: 01/15/22  1033     History  Chief Complaint  Patient presents with   Abdominal Pain    Chad Rivas is a 56 y.o. male.  Patient with history of diabetes, groin abscess and infection (07/2017) requiring admission, IV antibiotics and operation by urology, PE admission (2022) on lifelong apixaban --presents to the emergency department for evaluation of right groin swelling.  Patient states that symptoms started about 5 days ago.  He has had progressive swelling in the groin area, swelling of the scrotum, and swelling down onto the perineum.  No fevers but he has had chills.  He has had nausea without vomiting.  States that he has not really eaten anything in the past 3 days.  Symptoms feel like previous infection in 2019.  States that he was seen at [facility] urology 2 days ago and started on some antibiotics which she has been taking.  He states that swelling has actually gotten a little worse since then.       Home Medications Prior to Admission medications   Medication Sig Start Date End Date Taking? Authorizing Provider  apixaban (ELIQUIS) 5 MG TABS tablet Take 1 tablet (5 mg total) by mouth 2 (two) times daily. 08/19/20   Thurnell Lose, MD  atorvastatin (LIPITOR) 40 MG tablet Take 1 tablet (40 mg total) by mouth daily. 01/10/22   Adrian Prows, MD  blood glucose meter kit and supplies KIT Dispense based on patient and insurance preference. Use up to four times daily as directed. (FOR ICD-9 250.00, 250.01). For QAC - HS accuchecks. 08/13/20   Thurnell Lose, MD  carvedilol (COREG) 3.125 MG tablet Take 1 tablet (3.125 mg total) by mouth 2 (two) times daily with a meal. 08/27/20   Cantwell, Celeste C, PA-C  diphenhydrAMINE (BENADRYL) 25 MG tablet Take 1 tablet (25 mg total) by mouth every 6 (six) hours as needed for itching (Rash). 10/03/14   Montine Circle, PA-C  EPINEPHrine (EPIPEN  2-PAK) 0.3 mg/0.3 mL IJ SOAJ injection Inject 0.3 mg into the muscle once as needed (For anaphylaxis.). 08/13/20   Thurnell Lose, MD  FERREX 150 150 MG capsule Take 150 mg by mouth daily.  06/27/17   [provider]  fluticasone (FLONASE) 50 MCG/ACT nasal spray Place 1 spray into both nostrils daily as needed for allergies.  06/06/17   [provider]  glucose blood test strip Use four times daily as directed 08/13/20   Thurnell Lose, MD  insulin glargine (LANTUS SOLOSTAR) 100 UNIT/ML Solostar Pen Inject 0.3 mLs (30 Units total) into the skin at bedtime. 08/13/20   Thurnell Lose, MD  insulin lispro (HUMALOG) 100 UNIT/ML KwikPen Before each meal 3 times a day, 140-199 - 2 units, 200-250 - 4 units, 251-299 - 6 units,  300-349 - 8 units,  350 or above 10 units. 08/13/20   Thurnell Lose, MD  Insulin Pen Needle 32G X 4 MM MISC Use for subcutaneous insulin 4 times daily. 08/13/20   Thurnell Lose, MD  Lancet Devices (AUTOLET MINI) MISC Use as directed 08/13/20   Thurnell Lose, MD  loratadine (CLARITIN) 10 MG tablet Take 10 mg by mouth daily. 11/05/20   [provider]  Multiple Vitamins-Minerals (MULTIVITAMIN ADULT) TABS Take 1 tablet by mouth daily.    [provider]  oxymetazoline (AFRIN) 0.05 % nasal spray Place 1 spray into both nostrils 2 (  two) times daily as needed for congestion.    [provider]  TRUEplus Lancets 28G MISC Use as directed four times daily 08/13/20   Thurnell Lose, MD      Allergies    Penicillins, Canagliflozin, Sulfamethoxazole-trimethoprim, and Losartan    Review of Systems   Review of Systems  Physical Exam Updated Vital Signs BP (!) 148/84 (BP Location: Left Arm)   Pulse (!) 121   Temp 98.5 F (36.9 C) (Oral)   Resp 18   Ht _0  (1.778 m)   Wt (!) 158.8 kg   SpO2 91%   BMI 50.22 kg/m   Physical Exam Vitals and nursing note reviewed.  Constitutional:      General: He is not in acute distress.     Appearance: He is well-developed.  HENT:     Head: Normocephalic and atraumatic.  Eyes:     General:        Right eye: No discharge.        Left eye: No discharge.     Conjunctiva/sclera: Conjunctivae normal.  Cardiovascular:     Rate and Rhythm: Regular rhythm. Tachycardia present.     Heart sounds: Normal heart sounds.  Pulmonary:     Effort: Pulmonary effort is normal.     Breath sounds: Normal breath sounds.  Abdominal:     Palpations: Abdomen is soft.     Tenderness: There is abdominal tenderness.  Genitourinary:    Testes:        Left: Tenderness and swelling present.     Comments: Patient with swelling and tenderness to the right groin/pelvis area, induration and swelling of the scrotal wall, scrotum is about the size of a softball and total, with induration and some purulent discharge tracking into the groin crease and perineal area.  No subcutaneous emphysema. Musculoskeletal:     Cervical back: Normal range of motion and neck supple.  Skin:    General: Skin is warm and dry.  Neurological:     Mental Status: He is alert.     ED Results / Procedures / Treatments   Labs (all labs ordered are listed, but only abnormal results are displayed) Labs Reviewed  CBC WITH DIFFERENTIAL/PLATELET - Abnormal; Notable for the following components:      Result Value   WBC 21.3 (*)    Hemoglobin 11.2 (*)    HCT 36.0 (*)    MCV 76.6 (*)    MCH 23.8 (*)    Neutro Abs 17.4 (*)    Monocytes Absolute 1.7 (*)    Abs Immature Granulocytes 0.17 (*)    All other components within normal limits  PROTIME-INR - Abnormal; Notable for the following components:   Prothrombin Time 18.1 (*)    INR 1.5 (*)    All other components within normal limits  APTT - Abnormal; Notable for the following components:   aPTT 38 (*)    All other components within normal limits  URINALYSIS, ROUTINE W REFLEX MICROSCOPIC - Abnormal; Notable for the following components:   Color, Urine AMBER (*)    Glucose,  UA >=500 (*)    Hgb urine dipstick MODERATE (*)    Ketones, ur 5 (*)    Protein, ur 100 (*)    All other components within normal limits  CULTURE, BLOOD (ROUTINE X 2)  CULTURE, BLOOD (ROUTINE X 2)  LACTIC ACID, PLASMA  LACTIC ACID, PLASMA  COMPREHENSIVE METABOLIC PANEL    ED ECG REPORT   Date: 01/15/2022  Rate: 115  Rhythm: sinus tachycardia  QRS Axis: normal  Intervals: normal  ST/T Wave abnormalities: normal  Conduction Disutrbances:none  Narrative Interpretation:   Old EKG Reviewed: unchanged from 08/2020  I have personally reviewed the EKG tracing and agree with the computerized printout as noted.  Radiology No results found.  Procedures Procedures    Medications Ordered in ED Medications - No data to display  ED Course/ Medical Decision Making/ A&P    Patient seen and examined. History obtained directly from patient.   Labs/EKG: Ordered sepsis work-up.  Imaging: Ordered CT abdomen pelvis.  Medications/Fluids: Ordered: IV Zofran, will consider antibiotics.  Currently only sirs criteria is elevated pulse rate.   Most recent vital signs reviewed and are as follows: BP (!) 148/84 (BP Location: Left Arm)   Pulse (!) 121   Temp 98.5 F (36.9 C) (Oral)   Resp 18   Ht _0  (1.778 m)   Wt (!) 158.8 kg   SpO2 91%   BMI 50.22 kg/m   Initial impression: Right groin infection  2:52 PM White blood cell count 21.3.  Patient with no 2 SIRS criteria plus source, code sepsis ordered.  Ordered vancomycin, Zosyn, linezolid to cover for potential necrotizing infection.  3:00 PM Reassessment performed. Patient appears stable. Family at bedside.   Labs personally reviewed and interpreted including: CBC with differential white blood cell count 21.3, hemoglobin 11.2; PT/INR elevated at 1.5 likely secondary to anticoagulation as opposed to liver injury; APTT elevated at 38; UA no compelling signs of UTI.  Reviewed pertinent lab work and imaging with patient at bedside.  Questions answered.   Most current vital signs reviewed and are as follows: BP (!) 140/90 (BP Location: Right Arm)   Pulse (!) 116   Temp 98.5 F (36.9 C) (Oral)   Resp 20   Ht _1  (1.778 m)   Wt (!) 158.8 kg   SpO2 98%   BMI 50.22 kg/m   Plan: IV antibiotics and fluids, CT imaging, likely urology consultation and admission to hospital.  3:04 PM Signout to [facility] at shift change.   CRITICAL CARE Performed by: Carlisle Cater PA-C Total critical care time: 45 minutes Critical care time was exclusive of separately billable procedures and treating other patients. Critical care was necessary to treat or prevent imminent or life-threatening deterioration. Critical care was time spent personally by me on the following activities: development of treatment plan with patient and/or surrogate as well as nursing, discussions with consultants, evaluation of patient's response to treatment, examination of patient, obtaining history from patient or surrogate, ordering and performing treatments and interventions, ordering and review of laboratory studies, ordering and review of radiographic studies, pulse oximetry and re-evaluation of patient's condition.                            Medical Decision Making Amount and/or Complexity of Data Reviewed Labs: ordered. Radiology: ordered. ECG/medicine tests: ordered.  Risk Prescription drug management.   Patient with evidence of sepsis with tachycardia, elevated white blood cell count and right groin infection.  Patient is a diabetic.  He had a history of groin infection, possible Fournier's gangrene in 2019.  Also history of PE on lifelong anticoagulation, compliant.  Currently awaiting completion of work-up to determine need for surgical intervention.  He will need admission to the hospital.           Final Clinical Impression(s) / ED Diagnoses Final diagnoses:  Sepsis, due to unspecified organism, unspecified whether acute  organ dysfunction present [facility])  Groin abscess    Rx / DC Orders ED Discharge Orders     None         Carlisle Cater, PA-C 01/15/22 1504    Audley Hose, MD 01/15/22 [phone]

## 2022-01-15 NOTE — Transfer of Care (Signed)
Immediate Anesthesia Transfer of Care Note  Patient: Chad Rivas  Procedure(s) Performed: IRRIGATION AND DEBRIDEMENT ABSCESS  Patient Location: PACU  Anesthesia Type:General  Level of Consciousness: sedated and responds to stimulation  Airway & Oxygen Therapy: Patient Spontanous Breathing and Patient connected to face mask oxygen  Post-op Assessment: Report given to RN and Post -op Vital signs reviewed and stable  Post vital signs: Reviewed and stable  Last Vitals:  Vitals Value Taken Time  BP 137/71 01/15/22 1931  Temp    Pulse 115 01/15/22 1933  Resp 29 01/15/22 1933  SpO2 95 % 01/15/22 1933  Vitals shown include unvalidated device data.  Last Pain:  Vitals:   01/15/22 1755  TempSrc: Oral  PainSc: 6          Complications: No notable events documented.

## 2022-01-15 NOTE — Consult Note (Signed)
H&P Physician requesting consult: Margie Ege  Chief Complaint: Concern for Fournier's  History of Present Illness: 56 year old male with a history of groin abscess with subcutaneous air in 2019 who underwent incision and drainage by Dr. Vernie Ammons.  He presented today with a 5-day history of progressive groin swelling as well as scrotal and perineal swelling.  He has also had decreased p.o. intake.  He was seen on 11/9 at [facility] urology for the scrotal swelling.  He was noted to have a 2 to 3 cm area of induration.  He had reported that the area had been improving on p.o. antibiotics.  He was continued on doxycycline.  However, he had progression of symptoms including progressive right-sided groin pain and swelling.  Also has some scrotal swelling.  CT scan was performed that showed extensive infiltrative subcutaneous edema along the right groin region extending along the right upper thigh and right pubic bone and right penoscrotal region.  No gas was identified.   Past Medical History:  Diagnosis Date   Diabetes mellitus without complication (HCC)    Sleep apnea    pt has CPAP but does not use    Past Surgical History:  Procedure Laterality Date   IRRIGATION AND DEBRIDEMENT ABSCESS Right 07/13/2017   Procedure: INCISION AND DRAINAGE OF GROIN ABSCESS;  Surgeon: Ihor Gully, MD;  Location: [facility] ORS;  Service: Urology;  Laterality: Right;   JOINT REPLACEMENT  2009   R hip   TOE SURGERY     TOTAL HIP ARTHROPLASTY      Home Medications:  (Not in a hospital admission)  Allergies:  Allergies  Allergen Reactions   Penicillins Anaphylaxis and Other (See Comments)    Has patient had a PCN reaction causing immediate rash, facial/tongue/throat swelling, SOB or lightheadedness with hypotension: Y Has patient had a PCN reaction causing severe rash involving mucus membranes or skin necrosis: Y Has patient had a PCN reaction that required hospitalization: Y Has patient had a PCN reaction occurring  within the last 10 years: N If all of the above answers are "NO", then may proceed with Cephalosporin use.    Canagliflozin Other (See Comments)   Sulfamethoxazole-Trimethoprim Other (See Comments)   Losartan Other (See Comments)    Cause angioedema    Family History  Problem Relation Age of Onset   Cancer Father    Social History:  reports that he has never smoked. He has never used smokeless tobacco. He reports that he does not drink alcohol and does not use drugs.  ROS: A complete review of systems was performed.  All systems are negative except for pertinent findings as noted. ROS   Physical Exam:  Vital signs in last 24 hours: Temp:  [98.5 F (36.9 C)-99.3 F (37.4 C)] 99.3 F (37.4 C) (11/11 1645) Pulse Rate:  [105-121] 105 (11/11 1646) Resp:  [18-26] 19 (11/11 1646) BP: (118-148)/(63-90) 118/63 (11/11 1646) SpO2:  [91 %-98 %] 98 % (11/11 1646) Weight:  [158.8 kg] 158.8 kg (11/11 1116) General:  Alert and oriented, No acute distress HEENT: Normocephalic, atraumatic Neck: No JVD or lymphadenopathy Cardiovascular: Regular rate and rhythm Lungs: Regular rate and effort Abdomen: Soft, nontender, nondistended, no abdominal masses, obese Back: No CVA tenderness Genitourinary: Circumcised phallus.  He has some scrotal swelling with the scrotum is soft, nontender and there is no induration in the scrotum.  No induration along the penis.  Left groin without any induration.  Right groin with significant induration extending towards the inguinal area.  Just lateral  to the right scrotum inferiorly, there is a opening draining a large amount of purulent foul-smelling fluid.  There is no crepitus.  There is significant induration but unclear if any fluctuance.  He had a lot of tenderness with examination. Extremities: No edema Neurologic: Grossly intact  Laboratory Data:  Results for orders placed or performed during the hospital encounter of 01/15/22 (from the past 24 hour(s))   Lactic acid, plasma     Status: None   Collection Time: 01/15/22  1:47 PM  Result Value Ref Range   Lactic Acid, Venous 1.5 0.5 - 1.9 mmol/L  Comprehensive metabolic panel     Status: Abnormal   Collection Time: 01/15/22  2:10 PM  Result Value Ref Range   Sodium 130 (L) 135 - 145 mmol/L   Potassium 2.9 (L) 3.5 - 5.1 mmol/L   Chloride 94 (L) 98 - 111 mmol/L   CO2 27 22 - 32 mmol/L   Glucose, Bld 335 (H) 70 - 99 mg/dL   BUN 13 6 - 20 mg/dL   Creatinine, Ser 2.70 0.61 - 1.24 mg/dL   Calcium 8.1 (L) 8.9 - 10.3 mg/dL   Total Protein 8.5 (H) 6.5 - 8.1 g/dL   Albumin 2.9 (L) 3.5 - 5.0 g/dL   AST 22 15 - 41 U/L   ALT 21 0 - 44 U/L   Alkaline Phosphatase 91 38 - 126 U/L   Total Bilirubin 3.3 (H) 0.3 - 1.2 mg/dL   GFR, Estimated >62 >37 mL/min   Anion gap 9 5 - 15  CBC with Differential     Status: Abnormal   Collection Time: 01/15/22  2:10 PM  Result Value Ref Range   WBC 21.3 (H) 4.0 - 10.5 K/uL   RBC 4.70 4.22 - 5.81 MIL/uL   Hemoglobin 11.2 (L) 13.0 - 17.0 g/dL   HCT 62.8 (L) 31.5 - 17.6 %   MCV 76.6 (L) 80.0 - 100.0 fL   MCH 23.8 (L) 26.0 - 34.0 pg   MCHC 31.1 30.0 - 36.0 g/dL   RDW 16.0 73.7 - 10.6 %   Platelets 314 150 - 400 K/uL   nRBC 0.0 0.0 - 0.2 %   Neutrophils Relative % 82 %   Neutro Abs 17.4 (H) 1.7 - 7.7 K/uL   Lymphocytes Relative 9 %   Lymphs Abs 1.9 0.7 - 4.0 K/uL   Monocytes Relative 8 %   Monocytes Absolute 1.7 (H) 0.1 - 1.0 K/uL   Eosinophils Relative 0 %   Eosinophils Absolute 0.0 0.0 - 0.5 K/uL   Basophils Relative 0 %   Basophils Absolute 0.0 0.0 - 0.1 K/uL   Immature Granulocytes 1 %   Abs Immature Granulocytes 0.17 (H) 0.00 - 0.07 K/uL  Protime-INR     Status: Abnormal   Collection Time: 01/15/22  2:10 PM  Result Value Ref Range   Prothrombin Time 18.1 (H) 11.4 - 15.2 seconds   INR 1.5 (H) 0.8 - 1.2  APTT     Status: Abnormal   Collection Time: 01/15/22  2:10 PM  Result Value Ref Range   aPTT 38 (H) 24 - 36 seconds  Urinalysis, Routine w  reflex microscopic     Status: Abnormal   Collection Time: 01/15/22  2:10 PM  Result Value Ref Range   Color, Urine AMBER (A) YELLOW   APPearance CLEAR CLEAR   Specific Gravity, Urine 1.027 1.005 - 1.030   pH 6.0 5.0 - 8.0   Glucose, UA >=500 (A) NEGATIVE mg/dL  Hgb urine dipstick MODERATE (A) NEGATIVE   Bilirubin Urine NEGATIVE NEGATIVE   Ketones, ur 5 (A) NEGATIVE mg/dL   Protein, ur 256 (A) NEGATIVE mg/dL   Nitrite NEGATIVE NEGATIVE   Leukocytes,Ua NEGATIVE NEGATIVE   RBC / HPF 0-5 0 - 5 RBC/hpf   WBC, UA 0-5 0 - 5 WBC/hpf   Bacteria, UA NONE SEEN NONE SEEN   No results found for this or any previous visit (from the past 240 hour(s)). Creatinine: Recent Labs    01/15/22 1410  CREATININE 1.03   CT scan personally reviewed and is detailed in the history of present illness  Impression/Assessment:  Groin cellulitis and abscess  Plan:  There is significant drainage from the right groin area.  Plan for examination under anesthesia and incision and drainage with possible debridement.  It does not look like it has declared itself as Fournier's.  High risk of progression however.  He is on linezolid and metronidazole.  Ray Church, III 01/15/2022, 5:51 PM

## 2022-01-15 NOTE — Op Note (Signed)
Operative Note  Preoperative diagnosis:  1.  Groin abscess  Postoperative diagnosis: 1.  Groin abscess with surrounding groin cellulitis  Procedure(s): 1.  Incision and drainage of groin abscess  Surgeon: Modena Slater, MD  Assistants: None  Anesthesia: General  Complications: None immediate  EBL: Minimal  Specimens: 1.  Wound culture  Drains/Catheters: 1.  None  Intraoperative findings: 1.  Patient had an abscess pocket in the right inferior groin.  This extended posteriorly and also superiorly.  I made a counterincision in the area of induration of the right inguinal area and connected the 2 incisions into continuity.  No evidence of necrotic tissue.  Indication: 56 year old male presented with progressive groin induration and swelling and pain.  CT scan with significant edema.  Examination concerning for abscess versus early Fournier's.  He presents for urgent operating room evaluation for incision and drainage and possible debridement  Description of procedure:  The patient was identified and consent was obtained.  The patient was taken to the operating room and placed in the supine position.  The patient was placed under general anesthesia.  Perioperative antibiotics were administered.  The patient was placed in dorsal lithotomy.  Patient was prepped and draped in a standard sterile fashion and a timeout was performed.  Bovie electrocautery was used to open the inferior abscess pocket that was already draining.  There was a large abscess pocket behind this.  I obtained a wound culture.  I broke up loculations digitally.  The cavity extended superiorly towards the inguinal area.  I palpated significant edema and induration.  I decided make a counterincision in the right inguinal area and carried this down create continuity with the inferior incision.  Use normal saline to wash out the incision.  I then packed the incision with saline soaked Kerlix.  This include the operation.   Patient tolerated procedure well stable postoperatively  Plan: He will continue IV antibiotics.  Observe closely for any clinical change.  He will need daily wet-to-dry dressing changes.

## 2022-01-15 NOTE — Anesthesia Preprocedure Evaluation (Addendum)
Anesthesia Evaluation  Patient identified by MRN, date of birth, ID band Patient awake    Reviewed: Allergy & Precautions, NPO status , Patient's Chart, lab work & pertinent test results, reviewed documented beta blocker date and time   History of Anesthesia Complications Negative for: history of anesthetic complications  Airway Mallampati: III  TM Distance: >3 FB Neck ROM: Full    Dental  (+) Dental Advisory Given, Teeth Intact   Pulmonary sleep apnea , PE   Pulmonary exam normal        Cardiovascular Pt. on home beta blockers +CHF and + DVT  Normal cardiovascular exam  Echo 08/11/20: EF 40-45%, no RWMA, mild LVH, g2dd, normal RVSF with signs of RV pressure/volume overload, no MR/MS, no AR/AS  Echo 11/10/20: EF 50-55%, mod LVH, g2dd, no significant valvular abnormality    Neuro/Psych negative neurological ROS     GI/Hepatic negative GI ROS, Neg liver ROS,,,  Endo/Other  diabetes, Type 2, Insulin Dependent  Morbid obesityBMI 50  Renal/GU negative Renal ROS  negative genitourinary   Musculoskeletal negative musculoskeletal ROS (+)    Abdominal   Peds  Hematology  (+) Blood dyscrasia, anemia   Anesthesia Other Findings Groin abscess  On Eliquis  Reproductive/Obstetrics                             Anesthesia Physical Anesthesia Plan  ASA: 3 and emergent  Anesthesia Plan: General   Post-op Pain Management: Ofirmev IV (intra-op)* and Toradol IV (intra-op)*   Induction: Intravenous and Rapid sequence  PONV Risk Score and Plan: 2 and Ondansetron, Dexamethasone, Treatment may vary due to age or medical condition and Midazolam  Airway Management Planned: Oral ETT  Additional Equipment: None  Intra-op Plan:   Post-operative Plan: Extubation in OR  Informed Consent: I have reviewed the patients History and Physical, chart, labs and discussed the procedure including the risks, benefits  and alternatives for the proposed anesthesia with the patient or authorized representative who has indicated his/her understanding and acceptance.     Dental advisory given  Plan Discussed with:   Anesthesia Plan Comments:        Anesthesia Quick Evaluation

## 2022-01-15 NOTE — Anesthesia Postprocedure Evaluation (Addendum)
Anesthesia Post Note  Patient: Chad Rivas  Procedure(s) Performed: IRRIGATION AND DEBRIDEMENT ABSCESS     Patient location during evaluation: PACU Anesthesia Type: General Level of consciousness: awake and alert Pain management: pain level controlled Vital Signs Assessment: post-procedure vital signs reviewed and stable Respiratory status: spontaneous breathing, nonlabored ventilation, respiratory function stable and patient connected to face mask oxygen Cardiovascular status: blood pressure returned to baseline and stable Postop Assessment: no apparent nausea or vomiting Anesthetic complications: no   No notable events documented.  Last Vitals:  Vitals:   01/15/22 1755 01/15/22 1931  BP: 124/61 137/71  Pulse: (!) 109 (!) 115  Resp: 18 (!) 29  Temp: 37.7 C 37.6 C  SpO2: 100% 96%    Last Pain:  Vitals:   01/15/22 1755  TempSrc: Oral  PainSc: 6                  [name]

## 2022-01-15 NOTE — H&P (Signed)
History and Physical    Patient: Chad Rivas NID:[id number] DOB: [birth year] DOA: 01/15/2022 DOS: the patient was seen and examined on 01/15/2022 PCP: Kathalene Frames, MD  Patient coming from: Home  Chief Complaint:  Chief Complaint  Patient presents with   Abdominal Pain   HPI: Chad Rivas is a 56 y.o. male with medical history significant of DM2, morbid obesity, OSA, DVT. Presenting with groin pain. His symptoms started with a small area of induration 5 days ago. Since that time, that area has progressively swollen and become pain full. He's had chills, nausea, and vomiting. He has not been able to eat for the last 3 days. He has had issues like this in the past, so he saw his urologist 2 days ago and was started on abx. However, they have not been helpful. When his symptoms did not improve today, he decided to come to the ED for evaluation. He denies any other aggravating or alleviating factors.   Review of Systems: As mentioned in the history of present illness. All other systems reviewed and are negative. Past Medical History:  Diagnosis Date   Diabetes mellitus without complication ([place])    Sleep apnea    pt has CPAP but does not use    Past Surgical History:  Procedure Laterality Date   IRRIGATION AND DEBRIDEMENT ABSCESS Right 07/13/2017   Procedure: INCISION AND DRAINAGE OF GROIN ABSCESS;  Surgeon: Kathie Rhodes, MD;  Location: [facility];  Service: Urology;  Laterality: Right;   JOINT REPLACEMENT  2009   R hip   TOE SURGERY     TOTAL HIP ARTHROPLASTY     Social History:  reports that he has never smoked. He has never used smokeless tobacco. He reports that he does not drink alcohol and does not use drugs.  Allergies  Allergen Reactions   Penicillins Anaphylaxis and Other (See Comments)    Has patient had a PCN reaction causing immediate rash, facial/tongue/throat swelling, SOB or lightheadedness with hypotension: Y Has patient had a PCN reaction causing severe rash  involving mucus membranes or skin necrosis: Y Has patient had a PCN reaction that required hospitalization: Y Has patient had a PCN reaction occurring within the last 10 years: N If all of the above answers are "NO", then may proceed with Cephalosporin use.    Canagliflozin Other (See Comments)   Sulfamethoxazole-Trimethoprim Other (See Comments)   Losartan Other (See Comments)    Cause angioedema    Family History  Problem Relation Age of Onset   Cancer Father     Prior to Admission medications   Medication Sig Start Date End Date Taking? Authorizing Provider  apixaban (ELIQUIS) 5 MG TABS tablet Take 1 tablet (5 mg total) by mouth 2 (two) times daily. 08/19/20   Thurnell Lose, MD  atorvastatin (LIPITOR) 40 MG tablet Take 1 tablet (40 mg total) by mouth daily. 01/10/22   Adrian Prows, MD  blood glucose meter kit and supplies KIT Dispense based on patient and insurance preference. Use up to four times daily as directed. (FOR ICD-9 250.00, 250.01). For QAC - HS accuchecks. 08/13/20   Thurnell Lose, MD  carvedilol (COREG) 3.125 MG tablet Take 1 tablet (3.125 mg total) by mouth 2 (two) times daily with a meal. 08/27/20   Cantwell, Celeste C, PA-C  diphenhydrAMINE (BENADRYL) 25 MG tablet Take 1 tablet (25 mg total) by mouth every 6 (six) hours as needed for itching (Rash). 10/03/14   Montine Circle, PA-C  EPINEPHrine (  EPIPEN 2-PAK) 0.3 mg/0.3 mL IJ SOAJ injection Inject 0.3 mg into the muscle once as needed (For anaphylaxis.). 08/13/20   Thurnell Lose, MD  FERREX 150 150 MG capsule Take 150 mg by mouth daily.  06/27/17   [provider]  fluticasone (FLONASE) 50 MCG/ACT nasal spray Place 1 spray into both nostrils daily as needed for allergies.  06/06/17   [provider]  glucose blood test strip Use four times daily as directed 08/13/20   Thurnell Lose, MD  insulin glargine (LANTUS SOLOSTAR) 100 UNIT/ML Solostar Pen Inject 0.3 mLs (30 Units total) into the skin at  bedtime. 08/13/20   Thurnell Lose, MD  insulin lispro (HUMALOG) 100 UNIT/ML KwikPen Before each meal 3 times a day, 140-199 - 2 units, 200-250 - 4 units, 251-299 - 6 units,  300-349 - 8 units,  350 or above 10 units. 08/13/20   Thurnell Lose, MD  Insulin Pen Needle 32G X 4 MM MISC Use for subcutaneous insulin 4 times daily. 08/13/20   Thurnell Lose, MD  Lancet Devices (AUTOLET MINI) MISC Use as directed 08/13/20   Thurnell Lose, MD  loratadine (CLARITIN) 10 MG tablet Take 10 mg by mouth daily. 11/05/20   [provider]  Multiple Vitamins-Minerals (MULTIVITAMIN ADULT) TABS Take 1 tablet by mouth daily.    [provider]  oxymetazoline (AFRIN) 0.05 % nasal spray Place 1 spray into both nostrils 2 (two) times daily as needed for congestion.    [provider]  TRUEplus Lancets 28G MISC Use as directed four times daily 08/13/20   Thurnell Lose, MD    Physical Exam: Vitals:   01/15/22 1527 01/15/22 1530 01/15/22 1645 01/15/22 1646  BP: (!) 140/63 121/69 118/63 118/63  Pulse: (!) 114  (!) 106 (!) 105  Resp: (!) 24 (!) 26 (!) 22 19  Temp:   99.3 F (37.4 C)   TempSrc:   Oral   SpO2: 97%  96% 98%  Weight:      Height:       General: 56 y.o. male resting in bed in NAD Eyes: PERRL, normal sclera ENMT: Nares patent w/o discharge, orophaynx clear, dentition normal, ears w/o discharge/lesions/ulcers Neck: Supple, trachea midline Cardiovascular: RRR, +S1, S2, no m/g/r, equal pulses throughout Respiratory: CTABL, no w/r/r, normal WOB GI: BS+, NDNT, no masses noted, no organomegaly noted GU: induration of right groin w/ drainage abscess lateral and posterior to the scrotum MSK: No e/c/c Neuro: A&O x 3, no focal deficits Psyc: Appropriate interaction and affect, calm/cooperative  Data Reviewed:  Results for orders placed or performed during the hospital encounter of 01/15/22 (from the past 24 hour(s))  Comprehensive metabolic panel     Status: Abnormal    Collection Time: 01/15/22  2:10 PM  Result Value Ref Range   Sodium 130 (L) 135 - 145 mmol/L   Potassium 2.9 (L) 3.5 - 5.1 mmol/L   Chloride 94 (L) 98 - 111 mmol/L   CO2 27 22 - 32 mmol/L   Glucose, Bld 335 (H) 70 - 99 mg/dL   BUN 13 6 - 20 mg/dL   Creatinine, Ser 1.03 0.61 - 1.24 mg/dL   Calcium 8.1 (L) 8.9 - 10.3 mg/dL   Total Protein 8.5 (H) 6.5 - 8.1 g/dL   Albumin 2.9 (L) 3.5 - 5.0 g/dL   AST 22 15 - 41 U/L   ALT 21 0 - 44 U/L   Alkaline Phosphatase 91 38 - 126 U/L  Total Bilirubin 3.3 (H) 0.3 - 1.2 mg/dL   GFR, Estimated >60 >60 mL/min   Anion gap 9 5 - 15  CBC with Differential     Status: Abnormal   Collection Time: 01/15/22  2:10 PM  Result Value Ref Range   WBC 21.3 (H) 4.0 - 10.5 K/uL   RBC 4.70 4.22 - 5.81 MIL/uL   Hemoglobin 11.2 (L) 13.0 - 17.0 g/dL   HCT 36.0 (L) 39.0 - 52.0 %   MCV 76.6 (L) 80.0 - 100.0 fL   MCH 23.8 (L) 26.0 - 34.0 pg   MCHC 31.1 30.0 - 36.0 g/dL   RDW 14.9 11.5 - 15.5 %   Platelets 314 150 - 400 K/uL   nRBC 0.0 0.0 - 0.2 %   Neutrophils Relative % 82 %   Neutro Abs 17.4 (H) 1.7 - 7.7 K/uL   Lymphocytes Relative 9 %   Lymphs Abs 1.9 0.7 - 4.0 K/uL   Monocytes Relative 8 %   Monocytes Absolute 1.7 (H) 0.1 - 1.0 K/uL   Eosinophils Relative 0 %   Eosinophils Absolute 0.0 0.0 - 0.5 K/uL   Basophils Relative 0 %   Basophils Absolute 0.0 0.0 - 0.1 K/uL   Immature Granulocytes 1 %   Abs Immature Granulocytes 0.17 (H) 0.00 - 0.07 K/uL  Protime-INR     Status: Abnormal   Collection Time: 01/15/22  2:10 PM  Result Value Ref Range   Prothrombin Time 18.1 (H) 11.4 - 15.2 seconds   INR 1.5 (H) 0.8 - 1.2  APTT     Status: Abnormal   Collection Time: 01/15/22  2:10 PM  Result Value Ref Range   aPTT 38 (H) 24 - 36 seconds  Urinalysis, Routine w reflex microscopic     Status: Abnormal   Collection Time: 01/15/22  2:10 PM  Result Value Ref Range   Color, Urine AMBER (A) YELLOW   APPearance CLEAR CLEAR   Specific Gravity, Urine 1.027 1.005 -  1.030   pH 6.0 5.0 - 8.0   Glucose, UA >=500 (A) NEGATIVE mg/dL   Hgb urine dipstick MODERATE (A) NEGATIVE   Bilirubin Urine NEGATIVE NEGATIVE   Ketones, ur 5 (A) NEGATIVE mg/dL   Protein, ur 100 (A) NEGATIVE mg/dL   Nitrite NEGATIVE NEGATIVE   Leukocytes,Ua NEGATIVE NEGATIVE   RBC / HPF 0-5 0 - 5 RBC/hpf   WBC, UA 0-5 0 - 5 WBC/hpf   Bacteria, UA NONE SEEN NONE SEEN   CT ab/pelvis 1. Extensive infiltrative subcutaneous edema along the right groin region, extending along the visualized portion of the right upper thigh, right pubis, and right penoscrotal region. The lower portion of the scrotum and distal penis was not included on today's exam. No gas is identified in the visualized soft tissues. Acute infection/cellulitis not excluded. If the patient has risk factors for Fournier's gangrene such as diabetes then careful clinical surveillance and a low threshold for surgical consultation would be recommended. 2. Mild right external iliac and inguinal adenopathy, probably reactive. 3. Small type 1 hiatal hernia. 4. Lower lumbar spondylosis and degenerative disc disease with suspected bilateral foraminal impingement at L4-5 and L5-S1.  Assessment and Plan: Abscess of right groin SIRS     - admit to inpt, progressive     - continue zyvox, flagyl, cipro     - fluids     - follow bld cx     - going to OR tonight, hold eliquis  Hypokalemia Hyponatremia     -  replace K+; check Mg2+     - fluids  DM2     - NPO right now     - A1c, SSI, glucose checks  OSA     - CPAP qHS  Hx of DVT/PE     - on eliquis, hold for surgery tonight  Morbid obesity     - counsel on diet/lifestyle changes     - follow up outpt  Microcytic anemia     - check iron studies     - no evidence of bleed, follow  Advance Care Planning:   Code Status: FULL  Consults: Urology  Family Communication: w/ wife at bedside  Severity of Illness: The appropriate patient status for this patient is  INPATIENT. Inpatient status is judged to be reasonable and necessary in order to provide the required intensity of service to ensure the patient's safety. The patient's presenting symptoms, physical exam findings, and initial radiographic and laboratory data in the context of their chronic comorbidities is felt to place them at high risk for further clinical deterioration. Furthermore, it is not anticipated that the patient will be medically stable for discharge from the hospital within 2 midnights of admission.   * I certify that at the point of admission it is my clinical judgment that the patient will require inpatient hospital care spanning beyond 2 midnights from the point of admission due to high intensity of service, high risk for further deterioration and high frequency of surveillance required.*  Author: Jonnie Finner, DO 01/15/2022 5:13 PM  For on call review [email or web address].

## 2022-01-15 NOTE — ED Provider Notes (Signed)
Patient care taken over at shift change from outgoing provider  In short, 56 year old male patient with history of diabetes, groin abscess and infection (07/2017) requiring admission, IV antibiotics, and urologic operation, pulmonary embolism admission which is a 22 on lifelong Eliquis presents to the emergency department for evaluation of right-sided groin swelling.  Symptoms reportedly started 5 days ago.  He states that he has had progressive swelling in the groin area, scrotum, and swelling down into the perineum.  He endorses chills but denies fevers.  He also endorses nausea without vomiting.  He endorses anorexia over the past 3 days.  He states his symptoms feel similar to his abscess/infection from 2019.  He was seen 2 days ago at [facility] urology and started on antibiotics which she has been taking.  He states his symptoms have been worsening since that time. Physical Exam  BP 118/63 (BP Location: Right Arm)   Pulse (!) 105   Temp 99.3 F (37.4 C) (Oral)   Resp 19   Ht 5\' 10"  (1.778 m)   Wt (!) 158.8 kg   SpO2 98%   BMI 50.22 kg/m   Physical Exam Vitals and nursing note reviewed.  Constitutional:      Appearance: He is obese.  HENT:     Head: Normocephalic and atraumatic.  Eyes:     General: No scleral icterus. Cardiovascular:     Rate and Rhythm: Normal rate.  Pulmonary:     Effort: Pulmonary effort is normal.  Abdominal:     Palpations: Abdomen is soft.  Genitourinary:    Testes:        Right: Tenderness and swelling present.  Skin:    General: Skin is warm.  Neurological:     Mental Status: He is alert and oriented to person, place, and time.     Procedures  Procedures  ED Course / MDM    Medical Decision Making Amount and/or Complexity of Data Reviewed Labs: ordered. Radiology: ordered. ECG/medicine tests: ordered.  Risk Prescription drug management.    I interpreted and reviewed the CT abdomen pelvis which was ordered prior to taking over the  patient care. 1. Extensive infiltrative subcutaneous edema along the right groin  region, extending along the visualized portion of the right upper  thigh, right pubis, and right penoscrotal region. The lower portion  of the scrotum and distal penis was not included on today's exam. No  gas is identified in the visualized soft tissues. Acute  infection/cellulitis not excluded. If the patient has risk factors  for Fournier's gangrene such as diabetes then careful clinical  surveillance and a low threshold for surgical consultation would be  recommended.  2. Mild right external iliac and inguinal adenopathy, probably  reactive.  3. Small type 1 hiatal hernia.  4. Lower lumbar spondylosis and degenerative disc disease with  suspected bilateral foraminal impingement at L4-5 and L5-S1.  I agree with radiologist findings  I have requested consultation with urology.  Dr. plans to come and see patient for evaluation of possibly developing Fournier's gangrene.  Asked that patient be kept n.p.o. and to continue broad-spectrum antibiotics.  Requested consultation with the hospitalist.  Dr. Alvester Morin agrees to come see the patient for admission  The patient has significant infection in the groin and perineum which requires IV antibiotics and possible surgical intervention.  Patient requires admission to the hospital for further management.        Ronaldo Miyamoto, PA-C 01/15/22 1717    13/11/23, MD 01/15/22  1839  

## 2022-01-16 ENCOUNTER — Encounter (HOSPITAL_COMMUNITY): Payer: Self-pay | Admitting: Urology

## 2022-01-16 DIAGNOSIS — L02214 Cutaneous abscess of groin: Secondary | ICD-10-CM | POA: Diagnosis not present

## 2022-01-16 LAB — COMPREHENSIVE METABOLIC PANEL
ALT: 18 U/L (ref 0–44)
AST: 19 U/L (ref 15–41)
Albumin: 2.4 g/dL — ABNORMAL LOW (ref 3.5–5.0)
Alkaline Phosphatase: 94 U/L (ref 38–126)
Anion gap: 12 (ref 5–15)
BUN: 16 mg/dL (ref 6–20)
CO2: 25 mmol/L (ref 22–32)
Calcium: 7.8 mg/dL — ABNORMAL LOW (ref 8.9–10.3)
Chloride: 97 mmol/L — ABNORMAL LOW (ref 98–111)
Creatinine, Ser: 0.91 mg/dL (ref 0.61–1.24)
GFR, Estimated: >60 mL/min (ref >60)
Glucose, Bld: 356 mg/dL — ABNORMAL HIGH (ref 70–99)
Potassium: 3.8 mmol/L (ref 3.5–5.1)
Sodium: 134 mmol/L — ABNORMAL LOW (ref 135–145)
Total Bilirubin: 2.4 mg/dL — ABNORMAL HIGH (ref 0.3–1.2)
Total Protein: 7.6 g/dL (ref 6.5–8.1)

## 2022-01-16 LAB — GLUCOSE, CAPILLARY
Glucose-Capillary: 324 mg/dL — ABNORMAL HIGH (ref 70–99)
Glucose-Capillary: 338 mg/dL — ABNORMAL HIGH (ref 70–99)
Glucose-Capillary: 315 mg/dL — ABNORMAL HIGH (ref 70–99)
Glucose-Capillary: 388 mg/dL — ABNORMAL HIGH (ref 70–99)

## 2022-01-16 LAB — CBC
HCT: 33.6 % — ABNORMAL LOW (ref 39.0–52.0)
Hemoglobin: 10.3 g/dL — ABNORMAL LOW (ref 13.0–17.0)
MCH: 23.9 pg — ABNORMAL LOW (ref 26.0–34.0)
MCHC: 30.7 g/dL (ref 30.0–36.0)
MCV: 78 fL — ABNORMAL LOW (ref 80.0–100.0)
Platelets: 294 10*3/uL (ref 150–400)
RBC: 4.31 MIL/uL (ref 4.22–5.81)
RDW: 15.1 % (ref 11.5–15.5)
WBC: 17 10*3/uL — ABNORMAL HIGH (ref 4.0–10.5)
nRBC: 0 % (ref 0.0–0.2)

## 2022-01-16 LAB — PROCALCITONIN: Procalcitonin: 0.65 ng/mL

## 2022-01-16 LAB — MAGNESIUM: Magnesium: 1.5 mg/dL — ABNORMAL LOW (ref 1.7–2.4)

## 2022-01-16 LAB — CORTISOL-AM, BLOOD: Cortisol - AM: 5.2 ug/dL — ABNORMAL LOW (ref 6.7–22.6)

## 2022-01-16 LAB — PROTIME-INR
INR: 1.4 — ABNORMAL HIGH (ref 0.8–1.2)
Prothrombin Time: 17.1 seconds — ABNORMAL HIGH (ref 11.4–15.2)

## 2022-01-16 LAB — HIV ANTIBODY (ROUTINE TESTING W REFLEX): HIV Screen 4th Generation wRfx: NONREACTIVE

## 2022-01-16 MED ORDER — ENOXAPARIN SODIUM 40 MG/0.4ML IJ SOSY
40.0000 mg | PREFILLED_SYRINGE | INTRAMUSCULAR | Status: DC
  Administered 2022-01-16 – 2022-01-18 (×3): 40 mg via SUBCUTANEOUS
  Filled 2022-01-16 (×3): qty 0.4

## 2022-01-16 MED ORDER — HYDROMORPHONE HCL 1 MG/ML IJ SOLN
1.0000 mg | Freq: Once | INTRAMUSCULAR | Status: AC
  Administered 2022-01-16: 1 mg via INTRAVENOUS
  Filled 2022-01-16: qty 1

## 2022-01-16 MED ORDER — MAGNESIUM SULFATE 2 GM/50ML IV SOLN
2.0000 g | Freq: Once | INTRAVENOUS | Status: AC
  Administered 2022-01-16: 2 g via INTRAVENOUS
  Filled 2022-01-16: qty 50

## 2022-01-16 NOTE — Progress Notes (Signed)
PROGRESS NOTE    Chad Rivas  YYQ:[id number] DOB: 27-Nov-1965 DOA: 01/15/2022 PCP: Emilio Aspen, MD   Brief Narrative:  This 56 years old male with PMH significant for type 2 diabetes, morbid obesity, OSA, history of DVT presented in the ED with complaints of groin pain.  He reported his symptoms started 5 days ago with a small area of induration in the groan,  it has progressively swollen and became painful.  He also reports having chills nausea and vomiting,  denies any fever.  He has decreased appetite for last 3 days.He went to see his urologist 2 days ago and was started on antibiotics however his symptoms continued to get worse. Patient is admitted for right groin abscess,  started on IV antibiotics and the urologist is consulted. s/p incision and drainage.  Assessment & Plan:   Principal Problem:   Abscess of right groin Active Problems:   DM2 (diabetes mellitus, type 2) (HCC)   Morbid obesity (HCC)   History of pulmonary embolism   SIRS (systemic inflammatory response syndrome) (HCC)   Hypokalemia   Hyponatremia   OSA (obstructive sleep apnea)   Microcytic anemia  Right groin abscess: Patient presented with SIRS criteria (tachycardia, tachypnea, groan induration). Started on empiric antibiotics Zyvox Flagyl and Cipro Flagyl. Continue IV fluid resuscitation, follow blood cultures.   Urology is consulted, it appears high risk for fourneir's  gangrene Patient was taken to the OR and s/p incision and drainage. Continue pain control.  Hypokalemia /Hyponatremia: Replaced.  Continue to monitor  Type 2 diabetes: Resume carb modified diet. Hold p.o. diabetic medication Start regular insulin sliding scale  OSA: Continue CPAP at bedtime  History of DVT/PE: Eliquis is on hold for surgical intervention. Resume Eliquis once cleared from urology  Morbid obesity: Diet and exercise discussed in detail. Estimated body mass index is 50.22 kg/m as calculated from the  following:   Height as of this encounter: 5\' 10"  (1.778 m).   Weight as of this encounter: 158.8 kg.   Microcytic anemia: Check iron studies, No signs of visible obvious bleeding   DVT prophylaxis: SCDs Code Status: Full code Family Communication: No family at bed side. Disposition Plan:   Status is: Inpatient Remains inpatient appropriate because:  Admitted for right groin abscess s/p incision and drainage and debridement.  Continue IV antibiotics.   Patient is not medically clear for discharge.  Consultants:  Urology  Procedures: Incision and drainage of  groin abscess Antimicrobials:  Anti-infectives (From admission, onward)    Start     Dose/Rate Route Frequency Ordered Stop   01/16/22 0400  linezolid (ZYVOX) IVPB 600 mg        600 mg 300 mL/hr over 60 Minutes Intravenous Every 12 hours 01/15/22 1855     01/16/22 0300  ciprofloxacin (CIPRO) IVPB 400 mg        400 mg 200 mL/hr over 60 Minutes Intravenous Every 12 hours 01/15/22 1855     01/16/22 0300  metroNIDAZOLE (FLAGYL) IVPB 500 mg        500 mg 100 mL/hr over 60 Minutes Intravenous Every 12 hours 01/15/22 1855     01/15/22 1515  ciprofloxacin (CIPRO) IVPB 400 mg        400 mg 200 mL/hr over 60 Minutes Intravenous  Once 01/15/22 1503 01/15/22 1642   01/15/22 1515  metroNIDAZOLE (FLAGYL) IVPB 500 mg        500 mg 100 mL/hr over 60 Minutes Intravenous  Once 01/15/22 1503 01/15/22 1642  01/15/22 1500  linezolid (ZYVOX) IVPB 600 mg        600 mg 300 mL/hr over 60 Minutes Intravenous  Once 01/15/22 1452 01/15/22 1754       Subjective: Patient was seen and examined at bedside.  Overnight events noted. Patient  s/p  I and D of the right groin abscess , tolerated well. Still has significant erythema dressing noted.  Objective: Vitals:   01/15/22 2221 01/16/22 0054 01/16/22 0435 01/16/22 0837  BP:  114/71 122/72 117/65  Pulse: 87 81 75 73  Resp: 16 19 20 20   Temp:  97.6 F (36.4 C) 97.6 F (36.4 C) 97.6 F  (36.4 C)  TempSrc:  Axillary Oral Oral  SpO2: 98% 100% 100% 94%  Weight:      Height:        Intake/Output Summary (Last 24 hours) at 01/16/2022 1206 Last data filed at 01/16/2022 0857 Gross per 24 hour  Intake 4960.9 ml  Output 25 ml  Net 4935.9 ml   Filed Weights   01/15/22 1116  Weight: (!) 158.8 kg    Examination:  General exam: Appears comfortable, not in any acute distress. Respiratory system: CTA bilaterally, respiratory effort normal, RR 15 Cardiovascular system: S1 & S2 heard, regular rate and rhythm, no murmur. Gastrointestinal system: Abdomen is soft, non tender, non distended, BS+ Central nervous system: Alert and oriented X 3. No focal neurological deficits. Extremities: No edema, no cyanosis, no clubbing Genitourinary: Significant induration of right groin with drainage.  Covered in dressing Skin: No rashes, lesions or ulcers Psychiatry: Judgement and insight appear normal. Mood & affect appropriate.     Data Reviewed: I have personally reviewed following labs and imaging studies  CBC: Recent Labs  Lab 01/15/22 1410 01/16/22 0546  WBC 21.3* 17.0*  NEUTROABS 17.4*  --   HGB 11.2* 10.3*  HCT 36.0* 33.6*  MCV 76.6* 78.0*  PLT 314 294   Basic Metabolic Panel: Recent Labs  Lab 01/15/22 1410 01/16/22 0546  NA 130* 134*  K 2.9* 3.8  CL 94* 97*  CO2 27 25  GLUCOSE 335* 356*  BUN 13 16  CREATININE 1.03 0.91  CALCIUM 8.1* 7.8*  MG  --  1.5*   GFR: Estimated Creatinine Clearance: 140.8 mL/min (by C-G formula based on SCr of 0.91 mg/dL). Liver Function Tests: Recent Labs  Lab 01/15/22 1410 01/16/22 0546  AST 22 19  ALT 21 18  ALKPHOS 91 94  BILITOT 3.3* 2.4*  PROT 8.5* 7.6  ALBUMIN 2.9* 2.4*   No results for input(s): "LIPASE", "AMYLASE" in the last 168 hours. No results for input(s): "AMMONIA" in the last 168 hours. Coagulation Profile: Recent Labs  Lab 01/15/22 1410 01/16/22 0546  INR 1.5* 1.4*   Cardiac Enzymes: No results  for input(s): "CKTOTAL", "CKMB", "CKMBINDEX", "TROPONINI" in the last 168 hours. BNP (last 3 results) No results for input(s): "PROBNP" in the last 8760 hours. HbA1C: Recent Labs    01/15/22 1949  HGBA1C 11.6*   CBG: Recent Labs  Lab 01/15/22 1813 01/15/22 1936 01/15/22 2133 01/16/22 0733 01/16/22 1146  GLUCAP 271* 244* 246* 324* 338*   Lipid Profile: No results for input(s): "CHOL", "HDL", "LDLCALC", "TRIG", "CHOLHDL", "LDLDIRECT" in the last 72 hours. Thyroid Function Tests: No results for input(s): "TSH", "T4TOTAL", "FREET4", "T3FREE", "THYROIDAB" in the last 72 hours. Anemia Panel: Recent Labs    01/15/22 1949  TIBC 199*  IRON 16*   Sepsis Labs: Recent Labs  Lab 01/15/22 1347 01/15/22 1728 01/16/22 0546  PROCALCITON  --   --  0.65  LATICACIDVEN 1.5 1.6  --     Recent Results (from the past 240 hour(s))  Aerobic/Anaerobic Culture w Gram Stain (surgical/deep wound)     Status: None (Preliminary result)   Collection Time: 01/15/22  7:04 PM   Specimen: Abscess  Result Value Ref Range Status   Specimen Description   Final    ABSCESS GROIN Performed at [facility], [address] [place], [place] [address]    Special Requests   Final    AEROBIC AND ANAEROBIC CULTURE Performed at [facility], [address] [place], [place] [address]    Gram Stain IN BOTH AEROBIC AND ANAEROBIC BOTTLES  Final   Culture   Final    CULTURE REINCUBATED FOR BETTER GROWTH Performed at [facility], [address] [place], [place] [address]    Report Status PENDING  Incomplete     Radiology Studies: CT ABDOMEN PELVIS W CONTRAST  Result Date: 01/15/2022 CLINICAL DATA:  Right groin infection, lymphadenopathy EXAM: CT ABDOMEN AND PELVIS WITH CONTRAST TECHNIQUE: Multidetector CT imaging of the abdomen and pelvis was performed using the standard protocol following bolus administration of intravenous contrast. RADIATION DOSE REDUCTION:  This exam was performed according to the departmental dose-optimization program which includes automated exposure control, adjustment of the mA and/or kV according to patient size and/or use of iterative reconstruction technique. CONTRAST:  OMNIPAQUE IOHEXOL 300 MG/ML  SOLN COMPARISON:  05/11/2019 FINDINGS: Lower chest: 7 by 3 mm subpleural nodule in the left lower lobe on image 1 series 6, no change from 09/07/2016, considered benign. No further imaging workup of this lesion is indicated. 5 by 3 mm subpleural nodule in the left lower lobe image 27 series 6, no change from 09/07/2016, considered benign. No further imaging workup of this lesion is indicated. Small type 1 hiatal hernia. Hepatobiliary: Unremarkable Pancreas: Unremarkable Spleen: Unremarkable Adrenals/Urinary Tract: Unremarkable. Please note that the bladder and distal ureters are somewhat obscured by streak artifact from the patient's right hip implant. Stomach/Bowel: Unremarkable.  Normal appendix. Vascular/Lymphatic: Right external iliac node 1.2 cm in short axis on image 75 series 2. Right inguinal lymph node 1.5 cm in short axis on image 95 series 2. Reproductive: The prostate gland appears unremarkable. The abnormal right groin stranding extends partially along the right spermatic cord and along the pubis towards the scrotum. Penile involvement not excluded. Other: There is extensive infiltrative extra-abdominal subcutaneous edema along the right groin region, extending along the visualized portion of the right upper thigh, right pubis, and right penoscrotal region. The lower portion of the scrotum and distal penis was not included on today's exam. No gas is identified in the visualized soft tissues. Musculoskeletal: Right total hip prosthesis. Lower lumbar spondylosis and degenerative disc disease with suspected bilateral foraminal impingement at the L4-5 and L5-S1 levels. IMPRESSION: 1. Extensive infiltrative subcutaneous edema along the  right groin region, extending along the visualized portion of the right upper thigh, right pubis, and right penoscrotal region. The lower portion of the scrotum and distal penis was not included on today's exam. No gas is identified in the visualized soft tissues. Acute infection/cellulitis not excluded. If the patient has risk factors for Fournier's gangrene such as diabetes then careful clinical surveillance and a low threshold for surgical consultation would be recommended. 2. Mild right external iliac and inguinal adenopathy, probably reactive. 3. Small type 1 hiatal hernia. 4. Lower lumbar spondylosis and degenerative disc disease  with suspected bilateral foraminal impingement at L4-5 and L5-S1. Electronically Signed   By: Gaylyn RongWalter  Liebkemann M.D.   On: 01/15/2022 16:45    Scheduled Meds:   HYDROmorphone (DILAUDID) injection  1 mg Intravenous Once   insulin aspart  0-20 Units Subcutaneous TID WC   insulin aspart  0-5 Units Subcutaneous QHS   insulin glargine-yfgn  20 Units Subcutaneous QHS   Continuous Infusions:  sodium chloride 50 mL/hr at 01/16/22 0857   ciprofloxacin Stopped (01/16/22 0317)   linezolid (ZYVOX) IV Stopped (01/16/22 0425)   metronidazole Stopped (01/16/22 0319)     LOS: 1 day    Time spent: 50 mins    Yemaya Barnier, MD [facility]   If 7PM-7AM, please contact night-coverage

## 2022-01-16 NOTE — Progress Notes (Addendum)
Urology Inpatient Progress Report  Fournier gangrene [N49.3] Groin abscess [L02.214] Sepsis, due to unspecified organism, unspecified whether acute organ dysfunction present (HCC) [A41.9]  Procedure(s): IRRIGATION AND DEBRIDEMENT ABSCESS  1 Day Post-Op   Intv/Subj: No acute events overnight. Patient is without complaint.  Has been afebrile with vital signs stable.  States his pain has nearly resolved.  Labs improving.  Principal Problem:   Abscess of right groin Active Problems:   DM2 (diabetes mellitus, type 2) (HCC)   Morbid obesity (HCC)   History of pulmonary embolism   SIRS (systemic inflammatory response syndrome) (HCC)   Hypokalemia   Hyponatremia   OSA (obstructive sleep apnea)   Microcytic anemia  Current Facility-Administered Medications  Medication Dose Route Frequency Provider Last Rate Last Admin   0.9 %  sodium chloride infusion   Intravenous Continuous Kyle, Tyrone A, DO 50 mL/hr at 01/16/22 0857 Infusion Verify at 01/16/22 0857   ciprofloxacin (CIPRO) IVPB 400 mg  400 mg Intravenous Q12H Kyle, Tyrone A, DO   Stopped at 01/16/22 0317   insulin aspart (novoLOG) injection 0-20 Units  0-20 Units Subcutaneous TID WC Kyle, Tyrone A, DO   15 Units at 01/16/22 1202   insulin aspart (novoLOG) injection 0-5 Units  0-5 Units Subcutaneous QHS Kyle, Tyrone A, DO   2 Units at 01/15/22 2219   insulin glargine-yfgn (SEMGLEE) injection 20 Units  20 Units Subcutaneous QHS Kyle, Tyrone A, DO   20 Units at 01/15/22 2220   linezolid (ZYVOX) IVPB 600 mg  600 mg Intravenous Q12H Kyle, Tyrone A, DO   Stopped at 01/16/22 0425   metroNIDAZOLE (FLAGYL) IVPB 500 mg  500 mg Intravenous Q12H Kyle, Tyrone A, DO   Stopped at 01/16/22 0319   morphine (PF) 4 MG/ML injection 4 mg  4 mg Intravenous Q4H PRN Ronaldo Miyamoto, Tyrone A, DO   4 mg at 01/15/22 2042   phenol (CHLORASEPTIC) mouth spray 1 spray  1 spray Mouth/Throat PRN Ronaldo Miyamoto, Tyrone A, DO   1 spray at 01/15/22 2216   prochlorperazine (COMPAZINE)  injection 10 mg  10 mg Intravenous Q6H PRN Kyle, Tyrone A, DO         Objective: Vital: Vitals:   01/15/22 2221 01/16/22 0054 01/16/22 0435 01/16/22 0837  BP:  114/71 122/72 117/65  Pulse: 87 81 75 73  Resp: 16 19 20 20   Temp:  97.6 F (36.4 C) 97.6 F (36.4 C) 97.6 F (36.4 C)  TempSrc:  Axillary Oral Oral  SpO2: 98% 100% 100% 94%  Weight:      Height:       I/Os: I/O last 3 completed shifts: In: 4612.5 [I.V.:1225; Other:100; IV Piggyback:3287.5] Out: 25 [Blood:25]  Physical Exam:  General: Patient is in no apparent distress Lungs: Normal respiratory effort, chest expands symmetrically. GI:  The abdomen is soft and nontender without mass. Genitourinary: Circumcised phallus.  Some generalized scrotal and penile edema.  No induration, fluctuance, or crepitus.  I removed the packing from the right inguinal area and right lower groin.  Tissue appeared healthy underneath.  No significant drainage after packing removal.  I repacked with saline soaked Kerlix.  There was some mild induration in the inguinal area, improved from yesterday.  Some mild induration along the perineum towards the right, does not extend towards the rectum.  No crepitus or fluctuance anywhere throughout.  Examination significantly improved from yesterday. Ext: lower extremities symmetric  Lab Results: Recent Labs    01/15/22 1410 01/16/22 0546  WBC 21.3* 17.0*  HGB  11.2* 10.3*  HCT 36.0* 33.6*   Recent Labs    01/15/22 1410 01/16/22 0546  NA 130* 134*  K 2.9* 3.8  CL 94* 97*  CO2 27 25  GLUCOSE 335* 356*  BUN 13 16  CREATININE 1.03 0.91  CALCIUM 8.1* 7.8*   Recent Labs    01/15/22 1410 01/16/22 0546  INR 1.5* 1.4*   No results for input(s): "LABURIN" in the last 72 hours. Results for orders placed or performed during the hospital encounter of 01/15/22  Aerobic/Anaerobic Culture w Gram Stain (surgical/deep wound)     Status: None (Preliminary result)   Collection Time: 01/15/22  7:04 PM    Specimen: Abscess  Result Value Ref Range Status   Specimen Description   Final    ABSCESS GROIN Performed at [facility], [address], [place], [place] [address]    Special Requests   Final    AEROBIC AND ANAEROBIC CULTURE Performed at [facility], [address], [place], [place] [address]    Gram Stain IN BOTH AEROBIC AND ANAEROBIC BOTTLES  Final   Culture   Final    CULTURE REINCUBATED FOR BETTER GROWTH Performed at [facility], [address] [place], [place] [address]    Report Status PENDING  Incomplete    Studies/Results: CT ABDOMEN PELVIS W CONTRAST  Result Date: 01/15/2022 CLINICAL DATA:  Right groin infection, lymphadenopathy EXAM: CT ABDOMEN AND PELVIS WITH CONTRAST TECHNIQUE: Multidetector CT imaging of the abdomen and pelvis was performed using the standard protocol following bolus administration of intravenous contrast. RADIATION DOSE REDUCTION: This exam was performed according to the departmental dose-optimization program which includes automated exposure control, adjustment of the mA and/or kV according to patient size and/or use of iterative reconstruction technique. CONTRAST:  OMNIPAQUE IOHEXOL 300 MG/ML  SOLN COMPARISON:  05/11/2019 FINDINGS: Lower chest: 7 by 3 mm subpleural nodule in the left lower lobe on image 1 series 6, no change from 09/07/2016, considered benign. No further imaging workup of this lesion is indicated. 5 by 3 mm subpleural nodule in the left lower lobe image 27 series 6, no change from 09/07/2016, considered benign. No further imaging workup of this lesion is indicated. Small type 1 hiatal hernia. Hepatobiliary: Unremarkable Pancreas: Unremarkable Spleen: Unremarkable Adrenals/Urinary Tract: Unremarkable. Please note that the bladder and distal ureters are somewhat obscured by streak artifact from the patient's right hip implant. Stomach/Bowel: Unremarkable.  Normal appendix.  Vascular/Lymphatic: Right external iliac node 1.2 cm in short axis on image 75 series 2. Right inguinal lymph node 1.5 cm in short axis on image 95 series 2. Reproductive: The prostate gland appears unremarkable. The abnormal right groin stranding extends partially along the right spermatic cord and along the pubis towards the scrotum. Penile involvement not excluded. Other: There is extensive infiltrative extra-abdominal subcutaneous edema along the right groin region, extending along the visualized portion of the right upper thigh, right pubis, and right penoscrotal region. The lower portion of the scrotum and distal penis was not included on today's exam. No gas is identified in the visualized soft tissues. Musculoskeletal: Right total hip prosthesis. Lower lumbar spondylosis and degenerative disc disease with suspected bilateral foraminal impingement at the L4-5 and L5-S1 levels. IMPRESSION: 1. Extensive infiltrative subcutaneous edema along the right groin region, extending along the visualized portion of the right upper thigh, right pubis, and right penoscrotal region. The lower portion of the scrotum and distal penis was not included on today's exam. No gas  is identified in the visualized soft tissues. Acute infection/cellulitis not excluded. If the patient has risk factors for Fournier's gangrene such as diabetes then careful clinical surveillance and a low threshold for surgical consultation would be recommended. 2. Mild right external iliac and inguinal adenopathy, probably reactive. 3. Small type 1 hiatal hernia. 4. Lower lumbar spondylosis and degenerative disc disease with suspected bilateral foraminal impingement at L4-5 and L5-S1. Electronically Signed   By: Gaylyn Rong M.D.   On: 01/15/2022 16:45    Assessment: Scrotal abscess  Procedure(s): IRRIGATION AND DEBRIDEMENT ABSCESS, 1 Day Post-Op  doing well.  Plan: Continue IV antibiotics.  Okay for Lovenox DVT prophylaxis.  Would hold  Eliquis for now until continues to clinically improve.  Currently looks like the area has less induration.  He has no pain.  There is no fluctuance.  Wound appears clean.  Leukocytosis improving.  Would observe another day to ensure continued improvement.  Recommend wet-to-dry dressing changes by nursing twice daily.  I changed it today.  Blood glucose control is very important for him.   Modena Slater, MD Urology 01/16/2022, 12:41 PM

## 2022-01-17 LAB — GLUCOSE, CAPILLARY
Glucose-Capillary: 401 mg/dL — ABNORMAL HIGH (ref 70–99)
Glucose-Capillary: 269 mg/dL — ABNORMAL HIGH (ref 70–99)
Glucose-Capillary: 231 mg/dL — ABNORMAL HIGH (ref 70–99)
Glucose-Capillary: 136 mg/dL — ABNORMAL HIGH (ref 70–99)

## 2022-01-17 LAB — MAGNESIUM: Magnesium: 1.9 mg/dL (ref 1.7–2.4)

## 2022-01-17 LAB — BASIC METABOLIC PANEL
Anion gap: 8 (ref 5–15)
BUN: 18 mg/dL (ref 6–20)
CO2: 26 mmol/L (ref 22–32)
Calcium: 7 mg/dL — ABNORMAL LOW (ref 8.9–10.3)
Chloride: 98 mmol/L (ref 98–111)
Creatinine, Ser: 0.92 mg/dL (ref 0.61–1.24)
GFR, Estimated: >60 mL/min (ref >60)
Glucose, Bld: 354 mg/dL — ABNORMAL HIGH (ref 70–99)
Potassium: 3.5 mmol/L (ref 3.5–5.1)
Sodium: 132 mmol/L — ABNORMAL LOW (ref 135–145)

## 2022-01-17 LAB — CBC
HCT: 32.8 % — ABNORMAL LOW (ref 39.0–52.0)
Hemoglobin: 10 g/dL — ABNORMAL LOW (ref 13.0–17.0)
MCH: 23.5 pg — ABNORMAL LOW (ref 26.0–34.0)
MCHC: 30.5 g/dL (ref 30.0–36.0)
MCV: 77 fL — ABNORMAL LOW (ref 80.0–100.0)
Platelets: 323 10*3/uL (ref 150–400)
RBC: 4.26 MIL/uL (ref 4.22–5.81)
RDW: 15.2 % (ref 11.5–15.5)
WBC: 16.2 10*3/uL — ABNORMAL HIGH (ref 4.0–10.5)
nRBC: 0 % (ref 0.0–0.2)

## 2022-01-17 LAB — GLUCOSE, RANDOM: Glucose, Bld: 324 mg/dL — ABNORMAL HIGH (ref 70–99)

## 2022-01-17 LAB — PHOSPHORUS: Phosphorus: 1.6 mg/dL — ABNORMAL LOW (ref 2.5–4.6)

## 2022-01-17 MED ORDER — SODIUM PHOSPHATES 45 MMOLE/15ML IV SOLN
30.0000 mmol | Freq: Once | INTRAVENOUS | Status: AC
  Administered 2022-01-17: 30 mmol via INTRAVENOUS
  Filled 2022-01-17: qty 10

## 2022-01-17 MED ORDER — INSULIN ASPART 100 UNIT/ML IJ SOLN
8.0000 [IU] | Freq: Three times a day (TID) | INTRAMUSCULAR | Status: DC
  Administered 2022-01-17 – 2022-01-18 (×4): 8 [IU] via SUBCUTANEOUS

## 2022-01-17 MED ORDER — HYDROMORPHONE HCL 1 MG/ML IJ SOLN
2.0000 mg | INTRAMUSCULAR | Status: DC | PRN
  Administered 2022-01-17 – 2022-01-18 (×2): 2 mg via INTRAVENOUS
  Filled 2022-01-17 (×2): qty 2

## 2022-01-17 MED ORDER — INSULIN GLARGINE-YFGN 100 UNIT/ML ~~LOC~~ SOLN
20.0000 [IU] | Freq: Two times a day (BID) | SUBCUTANEOUS | Status: DC
  Administered 2022-01-17 – 2022-01-18 (×3): 20 [IU] via SUBCUTANEOUS
  Filled 2022-01-17 (×4): qty 0.2

## 2022-01-17 NOTE — Progress Notes (Signed)
Night shift stated pt's pain was intense with dressing change, even after morphine 4 mg IV administered 40 mins. Prior to procedure. Spoke with urologist who stated to give dilaudid 2 mg prior to dressing changes. Order placed, Dr. Arita Miss said she would sign.  Notified Dr. Lucianne Muss of glucose 401 & placed order for STAT glucose. No new orders; so will give 20 units, after stat lab

## 2022-01-17 NOTE — Discharge Instructions (Signed)
Local Endocrinologists [facility] ([phone]) Dr. Cristina Gherghe Dr. Ajay Kumar [facility] ([phone]) Dr. Jeffrey Kerr [facility] ([phone]) Dr. Bindubal Balan Dr. Walter Kohut [facility] ([phone]) Dr. Stephen [facility] ([phone]) [[place] office]  ([phone]) [[place] office] Dr. Melissa Solum Dr. Thomas O'Connell [facility]) ([phone]) Autumn Hudnall ([place]), PA Dr. Dhaval Patel Dr. William Smith Jr. [facility] ([phone]) Dr. Gebre Nida [facility] ([phone]) Dr. Micheal Brennan Dr. Jennifer Badik Dr. Ashley Jessup Spencer Beasley, FNP Dr. Monica E. Doerr in [place]  ([phone]) 

## 2022-01-17 NOTE — Progress Notes (Signed)
PROGRESS NOTE    Chad Rivas S Everett  JYN:[id number] DOB: 11/25/1965 DOA: 01/15/2022 PCP: Emilio AspenHenderson, [name], MD   Brief Narrative:  This 56 years old male with PMH significant for type 2 diabetes, morbid obesity, OSA, history of DVT presented in the ED with complaints of groin pain.  He reported his symptoms started 5 days ago with a small area of induration in the groan,  it has progressively swollen and became painful.  He also reports having chills nausea and vomiting,  denies any fever.  He has decreased appetite for last 3 days.He went to see his urologist 2 days ago and was started on antibiotics however his symptoms continued to get worse. Patient is admitted for right groin abscess,  started on IV antibiotics and the urologist is consulted. s/p incision and drainage.  Assessment & Plan:   Principal Problem:   Abscess of right groin Active Problems:   DM2 (diabetes mellitus, type 2) (HCC)   Morbid obesity (HCC)   History of pulmonary embolism   SIRS (systemic inflammatory response syndrome) (HCC)   Hypokalemia   Hyponatremia   OSA (obstructive sleep apnea)   Microcytic anemia  Right groin abscess: Patient presented with SIRS criteria (tachycardia, tachypnea, groin induration). Started on empiric antibiotics ( Zyvox,  Flagyl and Cipro ) Continue IV fluid resuscitation, blood cultures no growth so far. Urology is consulted, it appears high risk for fourneir's  gangrene Patient was taken to the OR, He is s/p incision and drainage. Continue  adequate pain control.  Fluid cultures grows Streptococcus. Sensitivity pending, plan change to oral antibiotic before discharge. Continue adequate pain control before dressing change.  Hypokalemia /Hyponatremia: Replaced.  Continue to monitor  Type 2 diabetes: Resume carb modified diet. Hold p.o. diabetic medication Continue Semglee 20 units twice daily Continue regular insulin sliding scale.  OSA: Continue CPAP at  bedtime.  History of DVT/PE: Eliquis is on hold for surgical intervention. Resume Eliquis once cleared from urology. Started on Lovenox.  Morbid obesity: Diet and exercise discussed in detail. Estimated body mass index is 50.22 kg/m as calculated from the following:   Height as of this encounter: 5\' 10"  (1.778 m).   Weight as of this encounter: 158.8 kg.   Microcytic anemia: Check iron studies, No signs of visible obvious bleeding H&H 10.0.   DVT prophylaxis: Lovenox Code Status: Full code Family Communication: No family at bed side. Disposition Plan:   Status is: Inpatient Remains inpatient appropriate because:  Admitted for right groin abscess s/p incision and drainage and debridement.  Continue IV antibiotics.   Patient is not medically clear for discharge.  Consultants:  Urology  Procedures: Incision and drainage of  groin abscess Antimicrobials:  Anti-infectives (From admission, onward)    Start     Dose/Rate Route Frequency Ordered Stop   01/16/22 0400  linezolid (ZYVOX) IVPB 600 mg        600 mg 300 mL/hr over 60 Minutes Intravenous Every 12 hours 01/15/22 1855     01/16/22 0300  ciprofloxacin (CIPRO) IVPB 400 mg        400 mg 200 mL/hr over 60 Minutes Intravenous Every 12 hours 01/15/22 1855     01/16/22 0300  metroNIDAZOLE (FLAGYL) IVPB 500 mg        500 mg 100 mL/hr over 60 Minutes Intravenous Every 12 hours 01/15/22 1855     01/15/22 1515  ciprofloxacin (CIPRO) IVPB 400 mg        400 mg 200 mL/hr over 60 Minutes  Intravenous  Once 01/15/22 1503 01/15/22 1642   01/15/22 1515  metroNIDAZOLE (FLAGYL) IVPB 500 mg        500 mg 100 mL/hr over 60 Minutes Intravenous  Once 01/15/22 1503 01/15/22 1642   01/15/22 1500  linezolid (ZYVOX) IVPB 600 mg        600 mg 300 mL/hr over 60 Minutes Intravenous  Once 01/15/22 1452 01/15/22 1754       Subjective: Patient was seen and examined at bedside.  Overnight events noted. Patient is status post IND of right  groin abscess, tolerated well. Erythema is significantly improved.Patient complains about having pain while dressing.  Objective: Vitals:   01/16/22 0837 01/16/22 2057 01/17/22 0518 01/17/22 1248  BP: 117/65 127/71 (!) 112/58 (!) 120/59  Pulse: 73 91 74 83  Resp: 20 18 20 20   Temp: 97.6 F (36.4 C) 97.9 F (36.6 C) 98.4 F (36.9 C) 98.1 F (36.7 C)  TempSrc: Oral Oral Oral Oral  SpO2: 94% 95% 98% 95%  Weight:      Height:        Intake/Output Summary (Last 24 hours) at 01/17/2022 1352 Last data filed at 01/17/2022 0942 Gross per 24 hour  Intake 3497.27 ml  Output --  Net 3497.27 ml   Filed Weights   01/15/22 1116  Weight: (!) 158.8 kg    Examination:  General exam: Appears comfortable, not in any acute distress. Respiratory system: CTA bilaterally, respiratory effort normal, RR 12. Cardiovascular system: S1 & S2 heard, regular rate and rhythm, no murmur. Gastrointestinal system: Abdomen is soft, non tender, non distended, BS+ Central nervous system: Alert and oriented X 3. No focal neurological deficits. Extremities: No edema, no cyanosis, no clubbing Genitourinary: Right groin erythema significantly improved.  Still significantly swollen.  Covered in dressing Skin: No rashes, lesions or ulcers Psychiatry: Judgement and insight appear normal. Mood & affect appropriate.     Data Reviewed: I have personally reviewed following labs and imaging studies  CBC: Recent Labs  Lab 01/15/22 1410 01/16/22 0546 01/17/22 0517  WBC 21.3* 17.0* 16.2*  NEUTROABS 17.4*  --   --   HGB 11.2* 10.3* 10.0*  HCT 36.0* 33.6* 32.8*  MCV 76.6* 78.0* 77.0*  PLT 314 294 323   Basic Metabolic Panel: Recent Labs  Lab 01/15/22 1410 01/16/22 0546 01/17/22 0517 01/17/22 0845  NA 130* 134* 132*  --   K 2.9* 3.8 3.5  --   CL 94* 97* 98  --   CO2 27 25 26   --   GLUCOSE 335* 356* 354* 324*  BUN 13 16 18   --   CREATININE 1.03 0.91 0.92  --   CALCIUM 8.1* 7.8* 7.0*  --   MG  --   1.5* 1.9  --   PHOS  --   --  1.6*  --    GFR: Estimated Creatinine Clearance: 139.3 mL/min (by C-G formula based on SCr of 0.92 mg/dL). Liver Function Tests: Recent Labs  Lab 01/15/22 1410 01/16/22 0546  AST 22 19  ALT 21 18  ALKPHOS 91 94  BILITOT 3.3* 2.4*  PROT 8.5* 7.6  ALBUMIN 2.9* 2.4*   No results for input(s): "LIPASE", "AMYLASE" in the last 168 hours. No results for input(s): "AMMONIA" in the last 168 hours. Coagulation Profile: Recent Labs  Lab 01/15/22 1410 01/16/22 0546  INR 1.5* 1.4*   Cardiac Enzymes: No results for input(s): "CKTOTAL", "CKMB", "CKMBINDEX", "TROPONINI" in the last 168 hours. BNP (last 3 results) No results for input(s): "  PROBNP" in the last 8760 hours. HbA1C: Recent Labs    01/15/22 1949  HGBA1C 11.6*   CBG: Recent Labs  Lab 01/16/22 1146 01/16/22 1655 01/16/22 2059 01/17/22 0749 01/17/22 1148  GLUCAP 338* 315* 388* 401* 269*   Lipid Profile: No results for input(s): "CHOL", "HDL", "LDLCALC", "TRIG", "CHOLHDL", "LDLDIRECT" in the last 72 hours. Thyroid Function Tests: No results for input(s): "TSH", "T4TOTAL", "FREET4", "T3FREE", "THYROIDAB" in the last 72 hours. Anemia Panel: Recent Labs    01/15/22 1949  TIBC 199*  IRON 16*   Sepsis Labs: Recent Labs  Lab 01/15/22 1347 01/15/22 1728 01/16/22 0546  PROCALCITON  --   --  0.65  LATICACIDVEN 1.5 1.6  --     Recent Results (from the past 240 hour(s))  Blood Culture (routine x 2)     Status: None (Preliminary result)   Collection Time: 01/15/22  2:10 PM   Specimen: BLOOD  Result Value Ref Range Status   Specimen Description   Final    BLOOD BLOOD LEFT FOREARM Performed at [facility], [address], [place], [place] [address]    Special Requests   Final    BOTTLES DRAWN AEROBIC AND ANAEROBIC Blood Culture adequate volume Performed at [facility], [address], [place], [place] [address]    Culture   Final    NO  GROWTH <24 HRS Performed at [facility], [address] [place], [place] [address]    Report Status PENDING  Incomplete  Blood Culture (routine x 2)     Status: None (Preliminary result)   Collection Time: 01/15/22  2:18 PM   Specimen: BLOOD  Result Value Ref Range Status   Specimen Description   Final    BLOOD BLOOD RIGHT FOREARM Performed at [facility], [address] [place], [place] [address]    Special Requests   Final    BOTTLES DRAWN AEROBIC AND ANAEROBIC Blood Culture adequate volume Performed at [facility], [address], [place], [place] [address]    Culture   Final    NO GROWTH <24 HRS Performed at [facility], [address] [place], [place] [address]    Report Status PENDING  Incomplete  Aerobic/Anaerobic Culture w Gram Stain (surgical/deep wound)     Status: None (Preliminary result)   Collection Time: 01/15/22  7:04 PM   Specimen: Abscess  Result Value Ref Range Status   Specimen Description   Final    ABSCESS GROIN Performed at [facility], [address], [place], [place] [address]    Special Requests   Final    AEROBIC AND ANAEROBIC CULTURE Performed at [facility], [address] [place], [place] [address]    Gram Stain   Final    FEW WBC PRESENT, PREDOMINANTLY PMN MODERATE GRAM POSITIVE COCCI IN CHAINS MODERATE GRAM POSITIVE COCCI IN CLUSTERS MODERATE GRAM NEGATIVE RODS    Culture   Final    FEW STREPTOCOCCUS AGALACTIAE TESTING AGAINST S. AGALACTIAE NOT ROUTINELY PERFORMED DUE TO PREDICTABILITY OF AMP/PEN/VAN SUSCEPTIBILITY. Performed at [facility], [address], [place], [place] [address]    Report Status PENDING  Incomplete     Radiology Studies: CT ABDOMEN PELVIS W CONTRAST  Result Date: 01/15/2022 CLINICAL DATA:  Right groin infection, lymphadenopathy EXAM: CT ABDOMEN AND PELVIS WITH CONTRAST TECHNIQUE: Multidetector CT  imaging of the abdomen and pelvis was performed using the standard protocol following bolus administration of  intravenous contrast. RADIATION DOSE REDUCTION: This exam was performed according to the departmental dose-optimization program which includes automated exposure control, adjustment of the mA and/or kV according to patient size and/or use of iterative reconstruction technique. CONTRAST:  OMNIPAQUE IOHEXOL 300 MG/ML  SOLN COMPARISON:  05/11/2019 FINDINGS: Lower chest: 7 by 3 mm subpleural nodule in the left lower lobe on image 1 series 6, no change from 09/07/2016, considered benign. No further imaging workup of this lesion is indicated. 5 by 3 mm subpleural nodule in the left lower lobe image 27 series 6, no change from 09/07/2016, considered benign. No further imaging workup of this lesion is indicated. Small type 1 hiatal hernia. Hepatobiliary: Unremarkable Pancreas: Unremarkable Spleen: Unremarkable Adrenals/Urinary Tract: Unremarkable. Please note that the bladder and distal ureters are somewhat obscured by streak artifact from the patient's right hip implant. Stomach/Bowel: Unremarkable.  Normal appendix. Vascular/Lymphatic: Right external iliac node 1.2 cm in short axis on image 75 series 2. Right inguinal lymph node 1.5 cm in short axis on image 95 series 2. Reproductive: The prostate gland appears unremarkable. The abnormal right groin stranding extends partially along the right spermatic cord and along the pubis towards the scrotum. Penile involvement not excluded. Other: There is extensive infiltrative extra-abdominal subcutaneous edema along the right groin region, extending along the visualized portion of the right upper thigh, right pubis, and right penoscrotal region. The lower portion of the scrotum and distal penis was not included on today's exam. No gas is identified in the visualized soft tissues. Musculoskeletal: Right total hip prosthesis. Lower lumbar spondylosis and degenerative  disc disease with suspected bilateral foraminal impingement at the L4-5 and L5-S1 levels. IMPRESSION: 1. Extensive infiltrative subcutaneous edema along the right groin region, extending along the visualized portion of the right upper thigh, right pubis, and right penoscrotal region. The lower portion of the scrotum and distal penis was not included on today's exam. No gas is identified in the visualized soft tissues. Acute infection/cellulitis not excluded. If the patient has risk factors for Fournier's gangrene such as diabetes then careful clinical surveillance and a low threshold for surgical consultation would be recommended. 2. Mild right external iliac and inguinal adenopathy, probably reactive. 3. Small type 1 hiatal hernia. 4. Lower lumbar spondylosis and degenerative disc disease with suspected bilateral foraminal impingement at L4-5 and L5-S1. Electronically Signed   By: Gaylyn Rong M.D.   On: 01/15/2022 16:45    Scheduled Meds:  enoxaparin (LOVENOX) injection  40 mg Subcutaneous Q24H   insulin aspart  0-20 Units Subcutaneous TID WC   insulin aspart  0-5 Units Subcutaneous QHS   insulin aspart  8 Units Subcutaneous TID WC   insulin glargine-yfgn  20 Units Subcutaneous BID   Continuous Infusions:  sodium chloride Stopped (01/17/22 0941)   ciprofloxacin Stopped (01/17/22 0307)   linezolid (ZYVOX) IV Stopped (01/17/22 9211)   metronidazole Stopped (01/17/22 0308)   sodium phosphate 30 mmol in dextrose 5 % 250 mL infusion 43 mL/hr at 01/17/22 0942     LOS: 2 days    Time spent: 35 mins    Erik Burkett, MD [facility]   If 7PM-7AM, please contact night-coverage

## 2022-01-17 NOTE — Progress Notes (Signed)
Administered dilaudid 2 mg prior to dressing change. Removed pt's dressings from right inguinal area & below crevice to the right of scrotal area, each removed with a large amount of saline, irrigated further, & patted dry with dry sterile gauze. Inserted sterile dressings with NS, covered with dry gauze & covered all with mepilex to secure all. Pt tolerated very well. Linens completely changed afterwards.

## 2022-01-17 NOTE — Progress Notes (Signed)
Mobility Specialist - Progress Note   01/17/22 1135  Mobility  Activity Ambulated independently in hallway  Level of Assistance Independent after set-up  Assistive Device None  Distance Ambulated (ft) 1500 ft  Range of Motion/Exercises Active  Activity Response Tolerated well  Mobility Referral Yes  $Mobility charge 1 Mobility   Pt was found in bed and agreeable to ambulate. Had no complaints during ambulation and at EOS returned to recliner chair with necessities in reach.  Billey Chang Mobility Specialist

## 2022-01-17 NOTE — Progress Notes (Signed)
  Transition of Care [facility]) Screening Note   Patient Details  Name: Chad Rivas Date of Birth: [birth year]   Transition of Care [facility]) CM/SW Contact:    Lanier Clam, RN Phone Number: 01/17/2022, 1:44 PM    Transition of Care Department [facility]) has reviewed patient and no TOC needs have been identified at this time. We will continue to monitor patient advancement through interdisciplinary progression rounds. If new patient transition needs arise, please place a TOC consult.

## 2022-01-17 NOTE — Progress Notes (Signed)
Urology Inpatient Progress Report  Fournier gangrene [N49.3] Groin abscess [L02.214] Sepsis, due to unspecified organism, unspecified whether acute organ dysfunction present (HCC) [A41.9]  Procedure(s): IRRIGATION AND DEBRIDEMENT ABSCESS  2 Days Post-Op   Intv/Subj: No overnight events.  Pain significantly improved and comfortable.  Principal Problem:   Abscess of right groin Active Problems:   DM2 (diabetes mellitus, type 2) (HCC)   Morbid obesity (HCC)   History of pulmonary embolism   SIRS (systemic inflammatory response syndrome) (HCC)   Hypokalemia   Hyponatremia   OSA (obstructive sleep apnea)   Microcytic anemia  Current Facility-Administered Medications  Medication Dose Route Frequency Provider Last Rate Last Admin   0.9 %  sodium chloride infusion   Intravenous Continuous Kyle, Tyrone A, DO 100 mL/hr at 01/16/22 2002 Infusion Verify at 01/16/22 2002   ciprofloxacin (CIPRO) IVPB 400 mg  400 mg Intravenous Q12H Kyle, Tyrone A, DO 200 mL/hr at 01/17/22 0205 400 mg at 01/17/22 0205   enoxaparin (LOVENOX) injection 40 mg  40 mg Subcutaneous Q24H Ray Church III, MD   40 mg at 01/16/22 1438   insulin aspart (novoLOG) injection 0-20 Units  0-20 Units Subcutaneous TID WC Kyle, Tyrone A, DO   15 Units at 01/16/22 1706   insulin aspart (novoLOG) injection 0-5 Units  0-5 Units Subcutaneous QHS Kyle, Tyrone A, DO   5 Units at 01/16/22 2149   insulin glargine-yfgn (SEMGLEE) injection 20 Units  20 Units Subcutaneous QHS Kyle, Tyrone A, DO   20 Units at 01/16/22 2148   linezolid (ZYVOX) IVPB 600 mg  600 mg Intravenous Q12H Kyle, Tyrone A, DO 300 mL/hr at 01/17/22 0519 600 mg at 01/17/22 0519   metroNIDAZOLE (FLAGYL) IVPB 500 mg  500 mg Intravenous Q12H Kyle, Tyrone A, DO 100 mL/hr at 01/17/22 0208 500 mg at 01/17/22 0208   morphine (PF) 4 MG/ML injection 4 mg  4 mg Intravenous Q4H PRN Ronaldo Miyamoto, Tyrone A, DO   4 mg at 01/17/22 4709   phenol (CHLORASEPTIC) mouth spray 1 spray  1 spray  Mouth/Throat PRN Margie Ege A, DO   1 spray at 01/15/22 2216   prochlorperazine (COMPAZINE) injection 10 mg  10 mg Intravenous Q6H PRN Kyle, Tyrone A, DO         Objective: Vital: Vitals:   01/16/22 0435 01/16/22 0837 01/16/22 2057 01/17/22 0518  BP: 122/72 117/65 127/71 (!) 112/58  Pulse: 75 73 91 74  Resp: 20 20 18 20   Temp: 97.6 F (36.4 C) 97.6 F (36.4 C) 97.9 F (36.6 C) 98.4 F (36.9 C)  TempSrc: Oral Oral Oral Oral  SpO2: 100% 94% 95% 98%  Weight:      Height:       I/Os: I/O last 3 completed shifts: In: 4371.1 [P.O.:600; I.V.:2415.4; Other:100; IV Piggyback:1255.7] Out: 25 [Blood:25]  Physical Exam:  General: Patient is in no apparent distress Lungs: Normal respiratory effort, chest expands symmetrically. GU: right groin wound edges clean, dressing intact without significant drainage Foley: none  Ext: lower extremities symmetric  Lab Results: Recent Labs    01/15/22 1410 01/16/22 0546 01/17/22 0517  WBC 21.3* 17.0* 16.2*  HGB 11.2* 10.3* 10.0*  HCT 36.0* 33.6* 32.8*   Recent Labs    01/15/22 1410 01/16/22 0546 01/17/22 0517  NA 130* 134* 132*  K 2.9* 3.8 3.5  CL 94* 97* 98  CO2 27 25 26   GLUCOSE 335* 356* 354*  BUN 13 16 18   CREATININE 1.03 0.91 0.92  CALCIUM 8.1* 7.8*  7.0*   Recent Labs    01/15/22 1410 01/16/22 0546  INR 1.5* 1.4*   No results for input(s): "LABURIN" in the last 72 hours. Results for orders placed or performed during the hospital encounter of 01/15/22  Blood Culture (routine x 2)     Status: None (Preliminary result)   Collection Time: 01/15/22  2:10 PM   Specimen: BLOOD  Result Value Ref Range Status   Specimen Description   Final    BLOOD BLOOD LEFT FOREARM Performed at [facility], [address], [place], [place] [address]    Special Requests   Final    BOTTLES DRAWN AEROBIC AND ANAEROBIC Blood Culture adequate volume Performed at [facility], [address]  [place], [place] [address]    Culture   Final    NO GROWTH <24 HRS Performed at [facility], [address] [place], [place] [address]    Report Status PENDING  Incomplete  Blood Culture (routine x 2)     Status: None (Preliminary result)   Collection Time: 01/15/22  2:18 PM   Specimen: BLOOD  Result Value Ref Range Status   Specimen Description   Final    BLOOD BLOOD RIGHT FOREARM Performed at [facility], [address], [place], [place] [address]    Special Requests   Final    BOTTLES DRAWN AEROBIC AND ANAEROBIC Blood Culture adequate volume Performed at [facility], [address], [place], [place] [address]    Culture   Final    NO GROWTH <24 HRS Performed at [facility], [address] [place], [place] [address]    Report Status PENDING  Incomplete  Aerobic/Anaerobic Culture w Gram Stain (surgical/deep wound)     Status: Abnormal (Preliminary result)   Collection Time: 01/15/22  7:04 PM   Specimen: Abscess  Result Value Ref Range Status   Specimen Description   Final    ABSCESS GROIN Performed at [facility], [address], [place], [place] [address]    Special Requests   Final    AEROBIC AND ANAEROBIC CULTURE Performed at [facility], [address] [place], [place] [address]    Gram Stain   Final    FEW WBC PRESENT, PREDOMINANTLY PMN MODERATE GRAM POSITIVE COCCI IN CHAINS MODERATE GRAM POSITIVE COCCI IN CLUSTERS MODERATE GRAM NEGATIVE RODS    Culture (A)  Final    STREPTOCOCCUS AGALACTIAE TESTING AGAINST S. AGALACTIAE NOT ROUTINELY PERFORMED DUE TO PREDICTABILITY OF AMP/PEN/VAN SUSCEPTIBILITY. Performed at [facility], [address], [place], [place] [address]    Report Status PENDING  Incomplete    Studies/Results: CT ABDOMEN PELVIS W CONTRAST  Result Date: 01/15/2022 CLINICAL DATA:  Right groin infection, lymphadenopathy EXAM: CT ABDOMEN AND  PELVIS WITH CONTRAST TECHNIQUE: Multidetector CT imaging of the abdomen and pelvis was performed using the standard protocol following bolus administration of intravenous contrast. RADIATION DOSE REDUCTION: This exam was performed according to the departmental dose-optimization program which includes automated exposure control, adjustment of the mA and/or kV according to patient size and/or use of iterative reconstruction technique. CONTRAST:  OMNIPAQUE IOHEXOL 300 MG/ML  SOLN COMPARISON:  05/11/2019 FINDINGS: Lower chest: 7 by 3 mm subpleural nodule in the left lower lobe on image 1 series 6, no change from 09/07/2016, considered benign. No further imaging workup of this lesion is indicated. 5 by 3 mm subpleural nodule in the left lower lobe image 27  series 6, no change from 09/07/2016, considered benign. No further imaging workup of this lesion is indicated. Small type 1 hiatal hernia. Hepatobiliary: Unremarkable Pancreas: Unremarkable Spleen: Unremarkable Adrenals/Urinary Tract: Unremarkable. Please note that the bladder and distal ureters are somewhat obscured by streak artifact from the patient's right hip implant. Stomach/Bowel: Unremarkable.  Normal appendix. Vascular/Lymphatic: Right external iliac node 1.2 cm in short axis on image 75 series 2. Right inguinal lymph node 1.5 cm in short axis on image 95 series 2. Reproductive: The prostate gland appears unremarkable. The abnormal right groin stranding extends partially along the right spermatic cord and along the pubis towards the scrotum. Penile involvement not excluded. Other: There is extensive infiltrative extra-abdominal subcutaneous edema along the right groin region, extending along the visualized portion of the right upper thigh, right pubis, and right penoscrotal region. The lower portion of the scrotum and distal penis was not included on today's exam. No gas is identified in the visualized soft tissues. Musculoskeletal: Right total hip  prosthesis. Lower lumbar spondylosis and degenerative disc disease with suspected bilateral foraminal impingement at the L4-5 and L5-S1 levels. IMPRESSION: 1. Extensive infiltrative subcutaneous edema along the right groin region, extending along the visualized portion of the right upper thigh, right pubis, and right penoscrotal region. The lower portion of the scrotum and distal penis was not included on today's exam. No gas is identified in the visualized soft tissues. Acute infection/cellulitis not excluded. If the patient has risk factors for Fournier's gangrene such as diabetes then careful clinical surveillance and a low threshold for surgical consultation would be recommended. 2. Mild right external iliac and inguinal adenopathy, probably reactive. 3. Small type 1 hiatal hernia. 4. Lower lumbar spondylosis and degenerative disc disease with suspected bilateral foraminal impingement at L4-5 and L5-S1. Electronically Signed   By: Gaylyn Rong M.D.   On: 01/15/2022 16:45    Assessment: Scrotal/groin abscess right POD#2 s/p I&D doing well.  Leukocytosis improving.    Plan: -Consider transitioning from IV antibiotics to p.o. antibiotics based on wound culture results showing strep a) -continue BID dressing changes by nursing -2mg  dilaudid prior to dressing changes -glucose control   , MD Urology 01/17/2022, 8:10 AM

## 2022-01-17 NOTE — Inpatient Diabetes Management (Addendum)
Inpatient Diabetes Program Recommendations  AACE/ADA: New Consensus Statement on Inpatient Glycemic Control (2015)  Target Ranges:  Prepandial:   less than 140 mg/dL      Peak postprandial:   less than 180 mg/dL (1-2 hours)      Critically ill patients:  140 - 180 mg/dL   Lab Results  Component Value Date   GLUCAP 401 (H) 01/17/2022   HGBA1C 11.6 (H) 01/15/2022    Review of Glycemic Control  Latest Reference Range & Units 01/16/22 11:46 01/16/22 16:55 01/16/22 20:59 01/17/22 07:49  Glucose-Capillary 70 - 99 mg/dL 542 (H) 706 (H) 237 (H) 401 (H)  (H): Data is abnormally high Diabetes history: Type 2 Dm Outpatient Diabetes medications: Lantus 55 units QHS, Humalog 0-11 units TID Current orders for Inpatient glycemic control: Semglee 20 units QHs, Novolog 0-20 units TID & HS Decadron 5 mg x 1  Inpatient Diabetes Program Recommendations:    Consider increasing Semglee 20 units BID and adding Novolog 8 units TID (assuming patient to consume >50% of meals).  Will plan to see.   Addendum: Spoke with patient at length regarding outpatient diabetes management. Denies missing doses and verifies home medications above. Patient reports difficulty managing glucose since he received two steroid injections into achilles tendon and two rounds of antibiotics for upper respiratory infections. Also, is willing to make additional changes and acknowledges increased portion size with meals. Reviewed patient's current A1c of 11.6%. Explained what a A1c is and what it measures. Also reviewed goal A1c with patient, importance of good glucose control @ home, and blood sugar goals. Reviewed role of pancreas, DM, importance of improved control, impact of infection with poor control, impact of steroids, current glucose trends, GLP as an option for outpatient management, survival skills, interventions, vascular changes and commorbidities. Patient wears Dexcom for blood sugars monitoring. Reviewed when to call MD and  report increases. Also, reviewed role of endocrinology and attached list to DC summary.   Question if patient may need meal coverage at discharge in addition to SSI.   Thanks, Lujean Rave, MSN, RNC-OB Diabetes Coordinator [phone] (8a-5p)

## 2022-01-18 LAB — CBC
HCT: 31.6 % — ABNORMAL LOW (ref 39.0–52.0)
Hemoglobin: 9.7 g/dL — ABNORMAL LOW (ref 13.0–17.0)
MCH: 23.5 pg — ABNORMAL LOW (ref 26.0–34.0)
MCHC: 30.7 g/dL (ref 30.0–36.0)
MCV: 76.7 fL — ABNORMAL LOW (ref 80.0–100.0)
Platelets: 323 10*3/uL (ref 150–400)
RBC: 4.12 MIL/uL — ABNORMAL LOW (ref 4.22–5.81)
RDW: 15.3 % (ref 11.5–15.5)
WBC: 11 10*3/uL — ABNORMAL HIGH (ref 4.0–10.5)
nRBC: 0 % (ref 0.0–0.2)

## 2022-01-18 LAB — BASIC METABOLIC PANEL
Anion gap: 6 (ref 5–15)
BUN: 13 mg/dL (ref 6–20)
CO2: 29 mmol/L (ref 22–32)
Calcium: 7.3 mg/dL — ABNORMAL LOW (ref 8.9–10.3)
Chloride: 100 mmol/L (ref 98–111)
Creatinine, Ser: 0.86 mg/dL (ref 0.61–1.24)
GFR, Estimated: >60 mL/min (ref >60)
Glucose, Bld: 191 mg/dL — ABNORMAL HIGH (ref 70–99)
Potassium: 3.2 mmol/L — ABNORMAL LOW (ref 3.5–5.1)
Sodium: 135 mmol/L (ref 135–145)

## 2022-01-18 LAB — PHOSPHORUS: Phosphorus: 2.4 mg/dL — ABNORMAL LOW (ref 2.5–4.6)

## 2022-01-18 LAB — MAGNESIUM: Magnesium: 1.9 mg/dL (ref 1.7–2.4)

## 2022-01-18 LAB — GLUCOSE, CAPILLARY
Glucose-Capillary: 202 mg/dL — ABNORMAL HIGH (ref 70–99)
Glucose-Capillary: 194 mg/dL — ABNORMAL HIGH (ref 70–99)

## 2022-01-18 MED ORDER — CEFADROXIL 500 MG PO CAPS
1000.0000 mg | ORAL_CAPSULE | Freq: Two times a day (BID) | ORAL | Status: DC
  Administered 2022-01-18: 1000 mg via ORAL
  Filled 2022-01-18 (×2): qty 2

## 2022-01-18 MED ORDER — METRONIDAZOLE 500 MG PO TABS: 500.0000 mg | ORAL_TABLET | Freq: Two times a day (BID) | ORAL | 0 refills | Status: AC

## 2022-01-18 MED ORDER — DOXYCYCLINE HYCLATE 100 MG PO TABS: 100.0000 mg | ORAL_TABLET | Freq: Two times a day (BID) | ORAL | 0 refills | Status: AC

## 2022-01-18 MED ORDER — CEFADROXIL 500 MG PO CAPS: 1000.0000 mg | ORAL_CAPSULE | Freq: Two times a day (BID) | ORAL | 0 refills | Status: AC

## 2022-01-18 MED ORDER — DOXYCYCLINE HYCLATE 100 MG PO TABS
100.0000 mg | ORAL_TABLET | Freq: Two times a day (BID) | ORAL | Status: DC
  Administered 2022-01-18: 100 mg via ORAL
  Filled 2022-01-18: qty 1

## 2022-01-18 MED ORDER — CEFDINIR 300 MG PO CAPS
300.0000 mg | ORAL_CAPSULE | Freq: Two times a day (BID) | ORAL | Status: DC
  Filled 2022-01-18: qty 1

## 2022-01-18 MED ORDER — POTASSIUM CHLORIDE 20 MEQ PO PACK
40.0000 meq | PACK | Freq: Once | ORAL | Status: AC
  Administered 2022-01-18: 40 meq via ORAL
  Filled 2022-01-18: qty 2

## 2022-01-18 MED ORDER — OXYCODONE-ACETAMINOPHEN 5-325 MG PO TABS: 1.0000 | ORAL_TABLET | Freq: Three times a day (TID) | ORAL | Status: DC | PRN

## 2022-01-18 MED ORDER — OXYCODONE-ACETAMINOPHEN 5-325 MG PO TABS: 1.0000 | ORAL_TABLET | Freq: Three times a day (TID) | ORAL | 0 refills | Status: AC | PRN

## 2022-01-18 MED ORDER — METRONIDAZOLE 500 MG PO TABS
500.0000 mg | ORAL_TABLET | Freq: Two times a day (BID) | ORAL | Status: DC
  Administered 2022-01-18: 500 mg via ORAL
  Filled 2022-01-18: qty 1

## 2022-01-18 NOTE — Inpatient Diabetes Management (Signed)
Inpatient Diabetes Program Recommendations  AACE/ADA: New Consensus Statement on Inpatient Glycemic Control (2015)  Target Ranges:  Prepandial:   less than 140 mg/dL      Peak postprandial:   less than 180 mg/dL (1-2 hours)      Critically ill patients:  140 - 180 mg/dL   Lab Results  Component Value Date   GLUCAP 202 (H) 01/18/2022   HGBA1C 11.6 (H) 01/15/2022    Review of Glycemic Control  Latest Reference Range & Units 01/17/22 07:49 01/17/22 11:48 01/17/22 16:47 01/17/22 19:58 01/18/22 07:51  Glucose-Capillary 70 - 99 mg/dL 294 (H) 765 (H) 465 (H) 136 (H) 202 (H)  (H): Data is abnormally high Diabetes history: Type 2 Dm Outpatient Diabetes medications: Lantus 55 units QHS, Humalog 0-11 units TID Current orders for Inpatient glycemic control: Semglee 20 units QHs, Novolog 0-20 units TID & HS Decadron 5 mg x 1   Inpatient Diabetes Program Recommendations:    If to remain inpatient: Consider increasing Semglee 25 units BID and adding Novolog 10 units TID (assuming patient to consume >50% of meals).    Thanks, Lujean Rave, MSN, RNC-OB Diabetes Coordinator [phone] (8a-5p)

## 2022-01-18 NOTE — Progress Notes (Signed)
Patient feeling much better.  Wound remains clean.  Nursing changing dressings.  Leukocytosis nearly resolved.  Has been afebrile.  Culture growing Streptococcus agalaciae  On examination, wound has clean edges.  Has packing in place.  Assessment: Groin abscess  Plan: Okay for discharge from my standpoint on oral antibiotic that covers the above bacteria.  May need home health for daily dressing changes less he feels comfortable doing it himself for family member.Marland Kitchen

## 2022-01-18 NOTE — TOC Transition Note (Signed)
Transition of Care [facility]) - CM/SW Discharge Note   Patient Details  Name: Chad Rivas MRN: [id number] Date of Birth: [birth year]  [facility] CM/SW Contact:  Lanier Clam, RN Phone Number: 01/18/2022, 2:30 PM   Clinical Narrative:  Spoke to patient about d/c home w/HH nurse for dsg change instructions-he states his wife will do dsg changes-he declines HH nursing. MD notified. No further CM needs.     Final next level of care: Home/Self Care Barriers to Discharge: No Barriers Identified   Patient Goals and CMS Choice Patient states their goals for this hospitalization and ongoing recovery are::  (Home) CMS Medicare.gov Compare Post Acute Care list provided to:: Patient Choice offered to / list presented to : NA  Discharge Placement                       Discharge Plan and Services   Discharge Planning Services: CM Consult                      HH Arranged: Refused HH          Social Determinants of Health (SDOH) Interventions     Readmission Risk Interventions     No data to display

## 2022-01-18 NOTE — Discharge Summary (Signed)
Physician Discharge Summary  SHERMAR FRIEDLAND OZD:[id number] DOB: [birth year] DOA: 01/15/2022  PCP: Kathalene Frames, MD  Admit date: 01/15/2022  Discharge date: 01/18/2022  Admitted From: Home.  Disposition:  Home.  Recommendations for Outpatient Follow-up:  Follow up with PCP in 1-2 weeks. Please obtain BMP/CBC in one week. Advised to follow-up with Urology as scheduled. Advised to take cefadroxil, doxycycline and metronidazole twice daily for 7 days.  Home Health:None Equipment/Devices:None  Discharge Condition: Stable CODE STATUS:Full code Diet recommendation: Heart Healthy   Brief [facility] Course: This 56 years old male with PMH significant for type 2 diabetes, morbid obesity, OSA, history of DVT presented in the ED with complaints of groin pain.  He reported his symptoms started 5 days ago with a small area of induration in the groin, it has progressively swollen and became painful.  He also reports having chills, nausea and vomiting,  denies any fever. He has decreased appetite for last 3 days. He went to see his urologist 2 days ago and was started on antibiotics however his symptoms continued to get worse. Patient was admitted for right groin abscess,  started on IV antibiotics and the urologist is consulted. s/p incision and drainage. Patient tolerated procedure well, seen by urologist recommended patient can be discharged based on oral antibiotics according to culture and sensitivity.  Blood cultures negative, fluid cultures growing Streptococcus sensitivity pending.  Patient wanted to be discharged.  Patient was taught how to do dressing.  Patient is being discharged home on doxycycline cefadroxil and metronidazole twice a day for 7 days to complete 10-day treatment.  Patient will follow-up with urology.   Discharge Diagnoses:  Principal Problem:   Abscess of right groin Active Problems:   DM2 (diabetes mellitus, type 2) (HCC)   Morbid obesity ([place])   History  of pulmonary embolism   SIRS (systemic inflammatory response syndrome) (HCC)   Hypokalemia   Hyponatremia   OSA (obstructive sleep apnea)   Microcytic anemia  Right groin abscess: Patient presented with SIRS criteria (tachycardia, tachypnea, groin induration). Started on empiric antibiotics ( Zyvox,  Flagyl and Cipro ) Continue IV fluid resuscitation, blood cultures no growth so far. Urology is consulted, it appears high risk for fourneir's  gangrene Patient was taken to the OR, He is s/p incision and drainage. Continue  adequate pain control.  Fluid cultures grows Streptococcus. Sensitivity pending, plan change to oral antibiotic before discharge. Continue adequate pain control before dressing change. Patient is being discharged home on doxycycline, cefadroxil and metronidazole for 7 days.   Hypokalemia /Hyponatremia: Replaced.  Resolved.  Type 2 diabetes: Resume carb modified diet. Hold p.o. diabetic medication Continue home insulin dosing. Continue regular insulin sliding scale.   OSA: Continue CPAP at bedtime.   History of DVT/PE: Eliquis resumed at discharge.   Morbid obesity: Diet and exercise discussed in detail. Estimated body mass index is 50.22 kg/m as calculated from the following:   Height as of this encounter: _0  (1.778 m).   Weight as of this encounter: 158.8 kg.    Microcytic anemia: Check iron studies, No signs of visible obvious bleeding H&H 10.0.    Discharge Instructions  Discharge Instructions     Call MD for:  difficulty breathing, headache or visual disturbances   Complete by: As directed    Call MD for:  persistant dizziness or light-headedness   Complete by: As directed    Call MD for:  persistant nausea and vomiting   Complete by: As directed  Diet - low sodium heart healthy   Complete by: As directed    Diet Carb Modified   Complete by: As directed    Discharge instructions   Complete by: As directed    Advised to follow-up  with primary care physician in 1 week. Advised to follow-up with urology as scheduled. Advised to take cefadroxil doxycycline and metronidazole twice daily for 7 days.   Discharge wound care:   Complete by: As directed    Daily dressing at home.   Increase activity slowly   Complete by: As directed       Allergies as of 01/18/2022       Reactions   Penicillins Anaphylaxis, Other (See Comments)   Has patient had a PCN reaction causing immediate rash, facial/tongue/throat swelling, SOB or lightheadedness with hypotension: Y Has patient had a PCN reaction causing severe rash involving mucus membranes or skin necrosis: Y Has patient had a PCN reaction that required hospitalization: Y Has patient had a PCN reaction occurring within the last 10 years: N If all of the above answers are "NO", then may proceed with Cephalosporin use. Pt states he had hives that required Benadryl treatment    Canagliflozin Other (See Comments)   Hx of Fournier's gangrene    Sulfamethoxazole-trimethoprim Hives   Losartan Other (See Comments)   Cause angioedema        Medication List     TAKE these medications    apixaban 5 MG Tabs tablet Commonly known as: ELIQUIS Take 1 tablet (5 mg total) by mouth 2 (two) times daily.   atorvastatin 40 MG tablet Commonly known as: LIPITOR Take 1 tablet (40 mg total) by mouth daily.   carvedilol 3.125 MG tablet Commonly known as: COREG Take 1 tablet (3.125 mg total) by mouth 2 (two) times daily with a meal.   cefadroxil 500 MG capsule Commonly known as: DURICEF Take 2 capsules (1,000 mg total) by mouth 2 (two) times daily for 7 days.   diphenhydrAMINE 25 MG tablet Commonly known as: BENADRYL Take 25 mg by mouth every 6 (six) hours as needed for itching or allergies. What changed: Another medication with the same name was removed. Continue taking this medication, and follow the directions you see here.   doxycycline 100 MG tablet Commonly known as:  VIBRA-TABS Take 1 tablet (100 mg total) by mouth every 12 (twelve) hours for 7 days.   EPINEPHrine 0.3 mg/0.3 mL Soaj injection Commonly known as: EpiPen 2-Pak Inject 0.3 mg into the muscle once as needed (For anaphylaxis.).   fluticasone 50 MCG/ACT nasal spray Commonly known as: FLONASE Place 1 spray into both nostrils daily as needed for allergies.   HumaLOG KwikPen 200 UNIT/ML KwikPen Generic drug: insulin lispro CHECK BLOOD GLUCOSE: LESS THAN 150 THEN 0 UNITS, IF 151-200 THEN INJECT 3 UNITS, 201-250 INJECT 5 UNITS; 251-300 INJECT 7 UNITS; 301-350 INJECT 9 UNITS; 301-400 INJECT 11 UNITS. PATIENT TO CHECK HIS BLOOD GLUCOSE 3 TIMES A DAY BEFORE EACH MEAL. What changed: Another medication with the same name was removed. Continue taking this medication, and follow the directions you see here.   Lantus SoloStar 100 UNIT/ML Solostar Pen Generic drug: insulin glargine Inject 0.3 mLs (30 Units total) into the skin at bedtime. What changed: how much to take   loratadine 10 MG tablet Commonly known as: CLARITIN Take 10 mg by mouth daily.   metroNIDAZOLE 500 MG tablet Commonly known as: FLAGYL Take 1 tablet (500 mg total) by mouth every 12 (twelve) hours  for 7 days.   Multivitamin Adult Tabs Take 1 tablet by mouth daily.   OneTouch Verio Flex System w/Device Kit Dispense based on patient and insurance preference. Use up to four times daily as directed. (FOR ICD-9 250.00, 250.01). For QAC - HS accuchecks.   OneTouch Verio test strip Generic drug: glucose blood Use four times daily as directed   oxyCODONE-acetaminophen 5-325 MG tablet Commonly known as: PERCOCET/ROXICET Take 1 tablet by mouth every 8 (eight) hours as needed for up to 3 days for severe pain.   oxymetazoline 0.05 % nasal spray Commonly known as: AFRIN Place 1 spray into both nostrils 2 (two) times daily as needed for congestion.   Pentips 32G X 4 MM Misc Generic drug: Insulin Pen Needle Use for subcutaneous  insulin 4 times daily.   SM TRUEdraw Lancing Device Misc Use as directed   TRUEplus Lancets 28G Misc Use as directed four times daily               Discharge Care Instructions  (From admission, onward)           Start     Ordered   01/18/22 0000  Discharge wound care:       Comments: Daily dressing at home.   01/18/22 1355            Follow-up Information     Kathalene Frames, MD Follow up in 1 week(s).   Specialty: Internal Medicine Contact information: [address], [address] [place] [address] [phone]         Marton Redwood III, MD Follow up in 1 week(s).   Specialty: Urology Contact information: [place] [address] [phone]                Allergies  Allergen Reactions   Penicillins Anaphylaxis and Other (See Comments)    Has patient had a PCN reaction causing immediate rash, facial/tongue/throat swelling, SOB or lightheadedness with hypotension: Y Has patient had a PCN reaction causing severe rash involving mucus membranes or skin necrosis: Y Has patient had a PCN reaction that required hospitalization: Y Has patient had a PCN reaction occurring within the last 10 years: N If all of the above answers are "NO", then may proceed with Cephalosporin use.   Pt states he had hives that required Benadryl treatment     Canagliflozin Other (See Comments)    Hx of Fournier's gangrene    Sulfamethoxazole-Trimethoprim Hives   Losartan Other (See Comments)    Cause angioedema    Consultations: Urology   Procedures/Studies: CT ABDOMEN PELVIS W CONTRAST  Result Date: 01/15/2022 CLINICAL DATA:  Right groin infection, lymphadenopathy EXAM: CT ABDOMEN AND PELVIS WITH CONTRAST TECHNIQUE: Multidetector CT imaging of the abdomen and pelvis was performed using the standard protocol following bolus administration of intravenous contrast. RADIATION DOSE REDUCTION: This exam was performed according to  the departmental dose-optimization program which includes automated exposure control, adjustment of the mA and/or kV according to patient size and/or use of iterative reconstruction technique. CONTRAST:  142m OMNIPAQUE IOHEXOL 300 MG/ML  SOLN COMPARISON:  05/11/2019 FINDINGS: Lower chest: 7 by 3 mm subpleural nodule in the left lower lobe on image 1 series 6, no change from 09/07/2016, considered benign. No further imaging workup of this lesion is indicated. 5 by 3 mm subpleural nodule in the left lower lobe image 27 series 6, no change from 09/07/2016, considered benign. No further imaging workup of this lesion is indicated. Small  type 1 hiatal hernia. Hepatobiliary: Unremarkable Pancreas: Unremarkable Spleen: Unremarkable Adrenals/Urinary Tract: Unremarkable. Please note that the bladder and distal ureters are somewhat obscured by streak artifact from the patient's right hip implant. Stomach/Bowel: Unremarkable.  Normal appendix. Vascular/Lymphatic: Right external iliac node 1.2 cm in short axis on image 75 series 2. Right inguinal lymph node 1.5 cm in short axis on image 95 series 2. Reproductive: The prostate gland appears unremarkable. The abnormal right groin stranding extends partially along the right spermatic cord and along the pubis towards the scrotum. Penile involvement not excluded. Other: There is extensive infiltrative extra-abdominal subcutaneous edema along the right groin region, extending along the visualized portion of the right upper thigh, right pubis, and right penoscrotal region. The lower portion of the scrotum and distal penis was not included on today's exam. No gas is identified in the visualized soft tissues. Musculoskeletal: Right total hip prosthesis. Lower lumbar spondylosis and degenerative disc disease with suspected bilateral foraminal impingement at the L4-5 and L5-S1 levels. IMPRESSION: 1. Extensive infiltrative subcutaneous edema along the right groin region, extending along  the visualized portion of the right upper thigh, right pubis, and right penoscrotal region. The lower portion of the scrotum and distal penis was not included on today's exam. No gas is identified in the visualized soft tissues. Acute infection/cellulitis not excluded. If the patient has risk factors for Fournier's gangrene such as diabetes then careful clinical surveillance and a low threshold for surgical consultation would be recommended. 2. Mild right external iliac and inguinal adenopathy, probably reactive. 3. Small type 1 hiatal hernia. 4. Lower lumbar spondylosis and degenerative disc disease with suspected bilateral foraminal impingement at L4-5 and L5-S1. Electronically Signed   By: Van Clines M.D.   On: 01/15/2022 16:45     Subjective: Patient was seen and examined at bedside.  Overnight events noted. Patient reports doing much better. He wants to be discharged.   Patient is cleared from urology to be discharged.  Discharge Exam: Vitals:   01/18/22 0900 01/18/22 1437  BP:  (!) 119/58  Pulse:  83  Resp: 15 (!) 25  Temp:  98.2 F (36.8 C)  SpO2:  97%   Vitals:   01/18/22 0700 01/18/22 0800 01/18/22 0900 01/18/22 1437  BP:    (!) 119/58  Pulse:    83  Resp: (!) _0 (!) 25  Temp:    98.2 F (36.8 C)  TempSrc:    Oral  SpO2:    97%  Weight:      Height:        General: Pt is alert, awake, not in acute distress Cardiovascular: RRR, S1/S2 +, no rubs, no gallops Respiratory: CTA bilaterally, no wheezing, no rhonchi Abdominal: Soft, NT, ND, bowel sounds + Extremities: no edema, no cyanosis. Genitourinary: Scrotum swollen but the redness and swelling is improving.    The results of significant diagnostics from this hospitalization (including imaging, microbiology, ancillary and laboratory) are listed below for reference.     Microbiology: Recent Results (from the past 240 hour(s))  Blood Culture (routine x 2)     Status: None (Preliminary result)    Collection Time: 01/15/22  2:10 PM   Specimen: BLOOD  Result Value Ref Range Status   Specimen Description   Final    BLOOD BLOOD LEFT FOREARM Performed at [facility] [address] [place], [place] [address]    Special Requests   Final    BOTTLES DRAWN AEROBIC AND ANAEROBIC Blood Culture  adequate volume Performed at [facility] [address] [place], [place] [address]    Culture   Final    NO GROWTH 2 DAYS Performed at [facility], [address] [place], [place] [address]    Report Status PENDING  Incomplete  Blood Culture (routine x 2)     Status: None (Preliminary result)   Collection Time: 01/15/22  2:18 PM   Specimen: BLOOD  Result Value Ref Range Status   Specimen Description   Final    BLOOD BLOOD RIGHT FOREARM Performed at [facility] [address], [place], [place] [address]    Special Requests   Final    BOTTLES DRAWN AEROBIC AND ANAEROBIC Blood Culture adequate volume Performed at [facility] [address], [place], [place] [address]    Culture   Final    NO GROWTH 2 DAYS Performed at [facility], [address] [place], [place] [address]    Report Status PENDING  Incomplete  Aerobic/Anaerobic Culture w Gram Stain (surgical/deep wound)     Status: None (Preliminary result)   Collection Time: 01/15/22  7:04 PM   Specimen: Abscess  Result Value Ref Range Status   Specimen Description   Final    ABSCESS GROIN Performed at [facility] [address] [place], [place] [address]    Special Requests   Final    AEROBIC AND ANAEROBIC CULTURE Performed at [facility], [place] [address], [place], [place] [address]    Gram Stain   Final    FEW WBC PRESENT, PREDOMINANTLY PMN MODERATE GRAM POSITIVE COCCI IN CHAINS MODERATE GRAM POSITIVE COCCI IN CLUSTERS MODERATE GRAM NEGATIVE RODS    Culture   Final    FEW  STREPTOCOCCUS AGALACTIAE TESTING AGAINST S. AGALACTIAE NOT ROUTINELY PERFORMED DUE TO PREDICTABILITY OF AMP/PEN/VAN SUSCEPTIBILITY. HOLDING FOR POSSIBLE ANAEROBE Performed at [facility], [place] [address] [place], [place] [address]    Report Status PENDING  Incomplete     Labs: BNP (last 3 results) No results for input(s): "BNP" in the last 8760 hours. Basic Metabolic Panel: Recent Labs  Lab 01/15/22 1410 01/16/22 0546 01/17/22 0517 01/17/22 0845 01/18/22 0447  NA 130* 134* 132*  --  135  K 2.9* 3.8 3.5  --  3.2*  CL 94* 97* 98  --  100  CO2 _0 --  29  GLUCOSE 335* 356* 354* 324* 191*  BUN _1 --  13  CREATININE 1.03 0.91 0.92  --  0.86  CALCIUM 8.1* 7.8* 7.0*  --  7.3*  MG  --  1.5* 1.9  --  1.9  PHOS  --   --  1.6*  --  2.4*   Liver Function Tests: Recent Labs  Lab 01/15/22 1410 01/16/22 0546  AST 22 19  ALT 21 18  ALKPHOS 91 94  BILITOT 3.3* 2.4*  PROT 8.5* 7.6  ALBUMIN 2.9* 2.4*   No results for input(s): "LIPASE", "AMYLASE" in the last 168 hours. No results for input(s): "AMMONIA" in the last 168 hours. CBC: Recent Labs  Lab 01/15/22 1410 01/16/22 0546 01/17/22 0517 01/18/22 0447  WBC 21.3* 17.0* 16.2* 11.0*  NEUTROABS 17.4*  --   --   --   HGB 11.2* 10.3* 10.0* 9.7*  HCT 36.0* 33.6* 32.8* 31.6*  MCV 76.6* 78.0* 77.0* 76.7*  PLT 314 294 323 323   Cardiac Enzymes: No results for input(s): "CKTOTAL", "  CKMB", "CKMBINDEX", "TROPONINI" in the last 168 hours. BNP: Invalid input(s): "POCBNP" CBG: Recent Labs  Lab 01/17/22 1148 01/17/22 1647 01/17/22 1958 01/18/22 0751 01/18/22 1258  GLUCAP 269* 231* 136* 202* 194*   D-Dimer No results for input(s): "DDIMER" in the last 72 hours. Hgb A1c Recent Labs    01/15/22 1949  HGBA1C 11.6*   Lipid Profile No results for input(s): "CHOL", "HDL", "LDLCALC", "TRIG", "CHOLHDL", "LDLDIRECT" in the last 72 hours. Thyroid function studies No results for input(s): "TSH", "T4TOTAL",  "T3FREE", "THYROIDAB" in the last 72 hours.  Invalid input(s): "FREET3" Anemia work up Recent Labs    01/15/22 1949  TIBC 199*  IRON 16*   Urinalysis    Component Value Date/Time   COLORURINE AMBER (A) 01/15/2022 1410   APPEARANCEUR CLEAR 01/15/2022 1410   LABSPEC 1.027 01/15/2022 1410   PHURINE 6.0 01/15/2022 1410   GLUCOSEU >=500 (A) 01/15/2022 1410   HGBUR MODERATE (A) 01/15/2022 1410   BILIRUBINUR NEGATIVE 01/15/2022 1410   KETONESUR 5 (A) 01/15/2022 1410   PROTEINUR 100 (A) 01/15/2022 1410   NITRITE NEGATIVE 01/15/2022 1410   LEUKOCYTESUR NEGATIVE 01/15/2022 1410   Sepsis Labs Recent Labs  Lab 01/15/22 1410 01/16/22 0546 01/17/22 0517 01/18/22 0447  WBC 21.3* 17.0* 16.2* 11.0*   Microbiology Recent Results (from the past 240 hour(s))  Blood Culture (routine x 2)     Status: None (Preliminary result)   Collection Time: 01/15/22  2:10 PM   Specimen: BLOOD  Result Value Ref Range Status   Specimen Description   Final    BLOOD BLOOD LEFT FOREARM Performed at [facility], [place] [address] [place], [place] [address]    Special Requests   Final    BOTTLES DRAWN AEROBIC AND ANAEROBIC Blood Culture adequate volume Performed at [facility] [address] [place], [place] [address]    Culture   Final    NO GROWTH 2 DAYS Performed at [facility], [address] [place], [place] [address]    Report Status PENDING  Incomplete  Blood Culture (routine x 2)     Status: None (Preliminary result)   Collection Time: 01/15/22  2:18 PM   Specimen: BLOOD  Result Value Ref Range Status   Specimen Description   Final    BLOOD BLOOD RIGHT FOREARM Performed at [facility] [address], [place], [place] [address]    Special Requests   Final    BOTTLES DRAWN AEROBIC AND ANAEROBIC Blood Culture adequate volume Performed at [facility] [address], [place], [place] [address]     Culture   Final    NO GROWTH 2 DAYS Performed at [facility], [address] [place], [place] [address]    Report Status PENDING  Incomplete  Aerobic/Anaerobic Culture w Gram Stain (surgical/deep wound)     Status: None (Preliminary result)   Collection Time: 01/15/22  7:04 PM   Specimen: Abscess  Result Value Ref Range Status   Specimen Description   Final    ABSCESS GROIN Performed at [facility] [address], [place], [place] [address]    Special Requests   Final    AEROBIC AND ANAEROBIC CULTURE Performed at [facility], [place] [address], [place], [place] [address]    Gram Stain   Final    FEW WBC PRESENT, PREDOMINANTLY PMN MODERATE GRAM POSITIVE COCCI IN CHAINS MODERATE GRAM POSITIVE COCCI IN CLUSTERS MODERATE GRAM NEGATIVE RODS  Culture   Final    FEW STREPTOCOCCUS AGALACTIAE TESTING AGAINST S. AGALACTIAE NOT ROUTINELY PERFORMED DUE TO PREDICTABILITY OF AMP/PEN/VAN SUSCEPTIBILITY. HOLDING FOR POSSIBLE ANAEROBE Performed at [facility], [place] [address], [place], [place] [address]    Report Status PENDING  Incomplete     Time coordinating discharge: Over 30 minutes  SIGNED:   Shawna Clamp, MD  [facility] 01/18/2022, 2:57 PM Pager   If 7PM-7AM, please contact night-coverage

## 2022-01-18 NOTE — Progress Notes (Signed)
RN provided pt with discharge instructions. IV removed. Tele removed. Pt given extra supplies that were left in room for use at home. Pt dressed in personal clothing and taken to the main entrance via wheelchair.

## 2022-01-19 LAB — AEROBIC/ANAEROBIC CULTURE W GRAM STAIN (SURGICAL/DEEP WOUND)

## 2022-01-20 LAB — CULTURE, BLOOD (ROUTINE X 2)
Culture: NO GROWTH
Culture: NO GROWTH
Special Requests: ADEQUATE
Special Requests: ADEQUATE

## 2022-03-24 ENCOUNTER — Ambulatory Visit: Payer: 59 | Admitting: Podiatry

## 2022-03-24 DIAGNOSIS — M2042 Other hammer toe(s) (acquired), left foot: Secondary | ICD-10-CM

## 2022-03-24 DIAGNOSIS — L603 Nail dystrophy: Secondary | ICD-10-CM

## 2022-03-24 DIAGNOSIS — E119 Type 2 diabetes mellitus without complications: Secondary | ICD-10-CM

## 2022-03-24 NOTE — Progress Notes (Signed)
  Subjective:  Patient ID: Chad Rivas, male    DOB: Sep 15, 1965,  MRN: [id number]  Chief Complaint  Patient presents with   Diabetes    New Patient- Diabetes with neuropathy. Abnormality of toenail made need to be removed      57 y.o. male presents with the above complaint. History confirmed with patient.  The second toe on the left foot was painful and nail is thickened this is doing better and no longer painful.  Objective:  Physical Exam: warm, good capillary refill, no trophic changes or ulcerative lesions, normal DP and PT pulses, normal sensory exam, and reducible hammertoe deformity second toe, nontender, thickening and dystrophy of nail plate.  Assessment:   1. Hammertoe of left foot   2. Nail dystrophy   3. Type 2 diabetes mellitus without complication, without long-term current use of insulin ([place])      Plan:  Patient was evaluated and treated and all questions answered.  Patient educated on diabetes. Discussed proper diabetic foot care and discussed risks and complications of disease. Educated patient in depth on reasons to return to the office immediately should he/she discover anything concerning or new on the feet. All questions answered. Discussed proper shoes as well.  Return in 1 year for annual diabetic foot exam or sooner if problems  We discussed the presence of the mallet toe contracture which is nontender for him currently and I do not recommend surgical intervention for this currently.  He does have some nail dystrophy secondary to this we discussed how these are related issues.  Return in about 1 year (around 03/25/2023) for diabetic foot exam.

## 2022-07-19 ENCOUNTER — Telehealth: Payer: Self-pay

## 2022-07-19 NOTE — Telephone Encounter (Signed)
ATC X1 LVM for patient to call the office back. Please schedule OV with Dr. Francine Graven or App for pulmonary clearance

## 2022-07-25 NOTE — Telephone Encounter (Signed)
ATC X1 LVM for patient to get scheduled for pulmonary clearance.

## 2022-07-27 NOTE — Telephone Encounter (Signed)
ATC X1 LVM for patient to call the office back. Patient has been contacted several times in regards to surgical clearance request. Patient has not called the office back. Request is being sent back today to [facility]. NFN

## 2022-08-08 ENCOUNTER — Other Ambulatory Visit: Payer: Self-pay | Admitting: Gastroenterology

## 2022-08-09 ENCOUNTER — Telehealth: Payer: Self-pay

## 2022-08-09 NOTE — Telephone Encounter (Signed)
ATC X1 LVM for patient to call the office back in regards to surgical clearance request. Will need OV with Dr. Francine Graven or APP to be cleared for procedure

## 2022-08-15 ENCOUNTER — Telehealth: Payer: Self-pay | Admitting: Acute Care

## 2022-08-15 NOTE — Telephone Encounter (Signed)
Fax received from Dr. Bosie Clos with [facility] to perform a Colonoscopy/Endoscopy on patient.  Patient needs surgery clearance. Surgery is 09/20/2022. Patient was seen on 09/11/20. Office protocol is a risk assessment can be sent to surgeon if patient has been seen in 60 days or less.   Sending to Kandice Robinsons for risk assessment or recommendations if patient needs to be seen in office prior to surgical procedure.    Sarah patient has OV scheduled for 08/30/22

## 2022-08-30 ENCOUNTER — Ambulatory Visit: Payer: 59 | Admitting: Acute Care

## 2022-08-30 ENCOUNTER — Encounter: Payer: Self-pay | Admitting: Acute Care

## 2022-08-30 VITALS — BP 130/80 | HR 77 | Temp 98.0°F | Ht 71.0 in | Wt 356.6 lb

## 2022-08-30 DIAGNOSIS — G4733 Obstructive sleep apnea (adult) (pediatric): Secondary | ICD-10-CM

## 2022-08-30 DIAGNOSIS — Z86718 Personal history of other venous thrombosis and embolism: Secondary | ICD-10-CM

## 2022-08-30 DIAGNOSIS — Z86711 Personal history of pulmonary embolism: Secondary | ICD-10-CM | POA: Diagnosis not present

## 2022-08-30 DIAGNOSIS — Z01818 Encounter for other preprocedural examination: Secondary | ICD-10-CM

## 2022-08-30 NOTE — Patient Instructions (Addendum)
It is good to see you today. You are cleared for endoscopy and colonoscopy with Dr. Oswaldo Done from a blood thinner perspective. Please hold Eliquis starting 7/14, and 7/15, resume the morning after the procedure , 7/17, unless the procedure  was particularly bloody. If this is the case, please call for additional instructions. Continue Eliquis as you have been doing twice daily with exception of above directions  Monitor for bleeding after the procedure.  [facility] should clear you from the perspective of your OSA. I have made some general recommendations in the office visit note. Call if you need [place].  Follow up with Dr. Francine Graven in 6 months, as you have not seen him in 2 years.  Continue follow up with cardiology Please contact office for sooner follow up if symptoms do not improve or worsen or seek emergency care

## 2022-08-30 NOTE — Progress Notes (Signed)
History of Present Illness Chad Rivas is a 57 y.o. male with history of DVT and PE ( DVT initially 15 years ago, and DVT with PE in 2022), OSA ( Sleep per [facility]) on CPAP, followed by Dr. Francine Graven . Last seen in the office 09/2020. Here for surgical clearance for Colonoscopy and EGD.   Synopsis Eliquis therapy indefinitely for his history of DVT about 15 years ago and his more recent history of DVT and pulmonary emboli in which she was hospitalized in 2022. Pt has OSA which is managed by [facility].   08/30/2022 Pt. Presents for surgical clearance for colonoscopy and endoscopy .This is being done to evaluate for  anemia. He is on chronic Eliquis for his history of VTE. We will have him hold this medication x 48 hours prior to procedure. The procedures are scheduled for 09/20/2022. He will need to hold Eliquis starting 7/14, and 7/15, resume the morning after the procedure , 7/17, unless the procedure  was particularly bloody. Patient is very compliant with his blood thinning medications. He has no significant complaints.No breathing issues. He is cleared for the procedure from the standpoint of managing his anticoagulants with the above recommendations for holding anti-coagulants x 48 hours prior to procedure. .  Any surgical clearance for his OSA will need to come from [facility] who is managing his OSA and  CPAP therapy, as we do not have access to his full downloads and current therapy.  General recommendations , based on down load from 10/2021 seen in Care everywhere, it looks like he was  on Auto Set 4-16 cm H2O at the time of the download He was using therapy 78% of the time AHI was 3.8, well controlled despite a mild leak. I am unsure of current therapy, as I do not see any additional information in Care Everywhere  Recommendation is for lowest effective dose of sedation, and monitoring of respirations, oxygen saturations  and airway during the procedure. CPAP if patient is slow to awaken after  procedure. He will be on his side for the procedure which is beneficial in OSA  Test Results:     Latest Ref Rng & Units 01/18/2022    4:47 AM 01/17/2022    5:17 AM 01/16/2022    5:46 AM  CBC  WBC 4.0 - 10.5 K/uL 11.0  16.2  17.0   Hemoglobin 13.0 - 17.0 g/dL 9.7  29.5  62.1   Hematocrit 39.0 - 52.0 % 31.6  32.8  33.6   Platelets 150 - 400 K/uL 323  323  294        Latest Ref Rng & Units 01/18/2022    4:47 AM 01/17/2022    8:45 AM 01/17/2022    5:17 AM  BMP  Glucose 70 - 99 mg/dL 308  657  846   BUN 6 - 20 mg/dL 13   18   Creatinine 9.62 - 1.24 mg/dL 9.52   8.41   Sodium 324 - 145 mmol/L 135   132   Potassium 3.5 - 5.1 mmol/L 3.2   3.5   Chloride 98 - 111 mmol/L 100   98   CO2 22 - 32 mmol/L 29   26   Calcium 8.9 - 10.3 mg/dL 7.3   7.0     BNP    Component Value Date/Time   BNP 26.9 08/10/2020 1819    ProBNP No results found for: "PROBNP"  PFT No results found for: "FEV1PRE", "FEV1POST", "FVCPRE", "FVCPOST", "TLC", "DLCOUNC", "PREFEV1FVCRT", "PSTFEV1FVCRT"  No results found.   Past medical hx Past Medical History:  Diagnosis Date   Diabetes mellitus without complication (HCC)    Sleep apnea    pt has CPAP but does not use      Social History   Tobacco Use   Smoking status: Never   Smokeless tobacco: Never  Vaping Use   Vaping Use: Never used  Substance Use Topics   Alcohol use: No   Drug use: No    Mr.Kerrick reports that he has never smoked. He has never used smokeless tobacco. He reports that he does not drink alcohol and does not use drugs.  Tobacco Cessation: Never smoker   Past surgical hx, Family hx, Social hx all reviewed.  Current Outpatient Medications on File Prior to Visit  Medication Sig   apixaban (ELIQUIS) 5 MG TABS tablet Take 1 tablet (5 mg total) by mouth 2 (two) times daily.   atorvastatin (LIPITOR) 40 MG tablet Take 1 tablet (40 mg total) by mouth daily.   blood glucose meter kit and supplies KIT Dispense based on  patient and insurance preference. Use up to four times daily as directed. (FOR ICD-9 250.00, 250.01). For QAC - HS accuchecks.   carvedilol (COREG) 3.125 MG tablet Take 1 tablet (3.125 mg total) by mouth 2 (two) times daily with a meal.   diphenhydrAMINE (BENADRYL) 25 MG tablet Take 25 mg by mouth every 6 (six) hours as needed for itching or allergies.   EPINEPHrine (EPIPEN 2-PAK) 0.3 mg/0.3 mL IJ SOAJ injection Inject 0.3 mg into the muscle once as needed (For anaphylaxis.).   fluticasone (FLONASE) 50 MCG/ACT nasal spray Place 1 spray into both nostrils daily as needed for allergies.    glucose blood test strip Use four times daily as directed   HUMALOG KWIKPEN 200 UNIT/ML KwikPen CHECK BLOOD GLUCOSE: LESS THAN 150 THEN 0 UNITS, IF 151-200 THEN INJECT 3 UNITS, 201-250 INJECT 5 UNITS; 251-300 INJECT 7 UNITS; 301-350 INJECT 9 UNITS; 301-400 INJECT 11 UNITS. PATIENT TO CHECK HIS BLOOD GLUCOSE 3 TIMES A DAY BEFORE EACH MEAL.   insulin glargine (LANTUS SOLOSTAR) 100 UNIT/ML Solostar Pen Inject 0.3 mLs (30 Units total) into the skin at bedtime. (Patient taking differently: Inject 40 Units into the skin at bedtime.)   Insulin Pen Needle 32G X 4 MM MISC Use for subcutaneous insulin 4 times daily.   Lancet Devices (AUTOLET MINI) MISC Use as directed   loratadine (CLARITIN) 10 MG tablet Take 10 mg by mouth daily.   Multiple Vitamins-Minerals (MULTIVITAMIN ADULT) TABS Take 1 tablet by mouth daily.   oxymetazoline (AFRIN) 0.05 % nasal spray Place 1 spray into both nostrils 2 (two) times daily as needed for congestion.   TRUEplus Lancets 28G MISC Use as directed four times daily   No current facility-administered medications on file prior to visit.     Allergies  Allergen Reactions   Penicillins Anaphylaxis and Other (See Comments)    Has patient had a PCN reaction causing immediate rash, facial/tongue/throat swelling, SOB or lightheadedness with hypotension: Y Has patient had a PCN reaction causing severe  rash involving mucus membranes or skin necrosis: Y Has patient had a PCN reaction that required hospitalization: Y Has patient had a PCN reaction occurring within the last 10 years: N If all of the above answers are "NO", then may proceed with Cephalosporin use.   Pt states he had hives that required Benadryl treatment     Canagliflozin Other (See Comments)    Hx of  Fournier's gangrene    Penicillin G Procaine Hives   Sulfamethoxazole-Trimethoprim Hives   Losartan Other (See Comments)    Cause angioedema    Review Of Systems:  Constitutional:   No  weight loss, night sweats,  Fevers, chills, fatigue, or  lassitude.  HEENT:   No headaches,  Difficulty swallowing,  Tooth/dental problems, or  Sore throat,                No sneezing, itching, ear ache, nasal congestion, post nasal drip,   CV:  No chest pain,  Orthopnea, PND, swelling in lower extremities, anasarca, dizziness, palpitations, syncope.   GI  No heartburn, indigestion, abdominal pain, nausea, vomiting, diarrhea, change in bowel habits, loss of appetite, bloody stools.   Resp: No shortness of breath with exertion or at rest.  No excess mucus, no productive cough,  No non-productive cough,  No coughing up of blood.  No change in color of mucus.  No wheezing.  No chest wall deformity  Skin: no rash or lesions.  GU: no dysuria, change in color of urine, no urgency or frequency.  No flank pain, no hematuria   MS:  No joint pain or swelling.  No decreased range of motion.  No back pain.  Psych:  No change in mood or affect. No depression or anxiety.  No memory loss.   Vital Signs BP 130/80 (BP Location: Left Arm)   Pulse 77   Temp 98 F (36.7 C)   Ht 5\' 11"  (1.803 m)   Wt (!) 356 lb 9.6 oz (161.8 kg)   SpO2 94%   BMI 49.74 kg/m    Physical Exam:  General- No distress,  A&Ox3, pleasant  ENT: No sinus tenderness, TM clear, pale nasal mucosa, no oral exudate,no post nasal drip, no LAN Cardiac: S1, S2, regular rate  and rhythm, no murmur Chest: No wheeze/ rales/ dullness; no accessory muscle use, no nasal flaring, no sternal retractions Abd.: Soft Non-tender, ND, BS +, Body mass index is 49.74 kg/m.  Ext: No clubbing cyanosis, edema Neuro:  normal strength, MAE x 4, A&O x 3 Skin: No rashes, warm and dry, no lesions  Psych: normal mood and behavior   Assessment/Plan Surgical clearance for Colonoscopy and EGD in patient on blood thinners for history of VTE OSA managed by [facility] You are cleared for endoscopy and colonoscopy with Dr. Oswaldo Done from a blood thinner perspective. Please hold Eliquis starting 7/14, and 7/15, resume the morning after the procedure , 7/17, unless the procedure  was particularly bloody. If this is the case, please call for additional instructions. Continue Eliquis as you have been doing twice daily with exception of above directions  Monitor for bleeding after the procedure.  [facility] should clear you from the perspective of your OSA. I have made some general recommendations in the office visit note. Call if you need [place].  Follow up with Dr. Francine Graven in 6 months, as you have not seen him in 2 years.  Continue follow up with cardiology Please contact office for sooner follow up if symptoms do not improve or worsen or seek emergency care    I spent 35 minutes dedicated to the care of this patient on the date of this encounter to include pre-visit review of records, face-to-face time with the patient discussing conditions above, post visit ordering of testing, clinical documentation with the electronic health record, making appropriate referrals as documented, and communicating necessary information to the patient's healthcare team.  Bevelyn Ngo, NP 08/30/2022  10:13 AM

## 2022-09-01 NOTE — Telephone Encounter (Signed)
OV notes and clearance form have been faxed back to [facility]. Nothing further needed at this time.  

## 2022-09-09 ENCOUNTER — Encounter (HOSPITAL_COMMUNITY): Payer: Self-pay | Admitting: Gastroenterology

## 2022-09-09 NOTE — Progress Notes (Signed)
Chad Rivas  Prep instructions- reviewed  PCP- Eleanora Neighbor Cardiologist- Patwardhan  EKG-01/15/22 Echo-08/11/20 Cath-n/a Stress-n/a ICD/PM-n/a Blood thinner-Eliquis 2 day hold GLP-1-n/a  Hx:  DM, OSA, DVT with PE, HTN, combined systolic and diastolic HF. Last sawc cardiology in 08/2020. Pulm manages blood thinner and gave clearance for procedure. They did however recommend he f/u with cardiology since he hasnt seen them recently. Per pt he feels good no issues.  Anesthesia Review: Yes

## 2022-09-12 ENCOUNTER — Encounter: Payer: Self-pay | Admitting: Cardiology

## 2022-09-20 ENCOUNTER — Ambulatory Visit (HOSPITAL_COMMUNITY)
Admission: RE | Admit: 2022-09-20 | Discharge: 2022-09-20 | Disposition: A | Discharge status: Home / Self Care | Payer: 59 | Attending: Gastroenterology | Admitting: Gastroenterology

## 2022-09-20 ENCOUNTER — Encounter (HOSPITAL_COMMUNITY): Payer: Self-pay | Admitting: Gastroenterology

## 2022-09-20 ENCOUNTER — Ambulatory Visit (HOSPITAL_BASED_OUTPATIENT_CLINIC_OR_DEPARTMENT_OTHER): Payer: 59 | Admitting: Certified Registered"

## 2022-09-20 ENCOUNTER — Ambulatory Visit (HOSPITAL_COMMUNITY): Payer: 59 | Admitting: Certified Registered"

## 2022-09-20 ENCOUNTER — Other Ambulatory Visit: Payer: Self-pay

## 2022-09-20 ENCOUNTER — Encounter (HOSPITAL_COMMUNITY)
Admission: RE | Disposition: A | Discharge status: Home / Self Care | Payer: Self-pay | Source: Home / Self Care | Attending: Gastroenterology

## 2022-09-20 DIAGNOSIS — D126 Benign neoplasm of colon, unspecified: Secondary | ICD-10-CM | POA: Diagnosis not present

## 2022-09-20 DIAGNOSIS — G4730 Sleep apnea, unspecified: Secondary | ICD-10-CM | POA: Diagnosis not present

## 2022-09-20 DIAGNOSIS — D125 Benign neoplasm of sigmoid colon: Secondary | ICD-10-CM | POA: Insufficient documentation

## 2022-09-20 DIAGNOSIS — Z6841 Body Mass Index (BMI) 40.0 and over, adult: Secondary | ICD-10-CM | POA: Diagnosis not present

## 2022-09-20 DIAGNOSIS — K2100 Gastro-esophageal reflux disease with esophagitis, without bleeding: Secondary | ICD-10-CM | POA: Insufficient documentation

## 2022-09-20 DIAGNOSIS — K2970 Gastritis, unspecified, without bleeding: Secondary | ICD-10-CM

## 2022-09-20 DIAGNOSIS — D124 Benign neoplasm of descending colon: Secondary | ICD-10-CM | POA: Insufficient documentation

## 2022-09-20 DIAGNOSIS — K2980 Duodenitis without bleeding: Secondary | ICD-10-CM | POA: Diagnosis not present

## 2022-09-20 DIAGNOSIS — R1310 Dysphagia, unspecified: Secondary | ICD-10-CM

## 2022-09-20 DIAGNOSIS — Z79899 Other long term (current) drug therapy: Secondary | ICD-10-CM | POA: Insufficient documentation

## 2022-09-20 DIAGNOSIS — Z794 Long term (current) use of insulin: Secondary | ICD-10-CM | POA: Diagnosis not present

## 2022-09-20 DIAGNOSIS — K640 First degree hemorrhoids: Secondary | ICD-10-CM | POA: Insufficient documentation

## 2022-09-20 DIAGNOSIS — I10 Essential (primary) hypertension: Secondary | ICD-10-CM | POA: Insufficient documentation

## 2022-09-20 DIAGNOSIS — D509 Iron deficiency anemia, unspecified: Secondary | ICD-10-CM | POA: Insufficient documentation

## 2022-09-20 DIAGNOSIS — I4891 Unspecified atrial fibrillation: Secondary | ICD-10-CM | POA: Insufficient documentation

## 2022-09-20 DIAGNOSIS — E119 Type 2 diabetes mellitus without complications: Secondary | ICD-10-CM | POA: Diagnosis not present

## 2022-09-20 DIAGNOSIS — K319 Disease of stomach and duodenum, unspecified: Secondary | ICD-10-CM | POA: Insufficient documentation

## 2022-09-20 HISTORY — PX: BIOPSY: SHX5522

## 2022-09-20 HISTORY — PX: POLYPECTOMY: SHX5525

## 2022-09-20 HISTORY — PX: COLONOSCOPY WITH PROPOFOL: SHX5780

## 2022-09-20 HISTORY — PX: ESOPHAGOGASTRODUODENOSCOPY (EGD) WITH PROPOFOL: SHX5813

## 2022-09-20 LAB — GLUCOSE, CAPILLARY: Glucose-Capillary: 227 mg/dL — ABNORMAL HIGH (ref 70–99)

## 2022-09-20 SURGERY — COLONOSCOPY WITH PROPOFOL
Anesthesia: Monitor Anesthesia Care

## 2022-09-20 MED ORDER — PROPOFOL 500 MG/50ML IV EMUL
INTRAVENOUS | Status: DC | PRN
  Administered 2022-09-20: 125 ug/kg/min via INTRAVENOUS

## 2022-09-20 MED ORDER — LIDOCAINE 2% (20 MG/ML) 5 ML SYRINGE
INTRAMUSCULAR | Status: DC | PRN
  Administered 2022-09-20: 100 mg via INTRAVENOUS

## 2022-09-20 MED ORDER — LACTATED RINGERS IV SOLN: INTRAVENOUS | Status: DC

## 2022-09-20 MED ORDER — MIDAZOLAM HCL 2 MG/2ML IJ SOLN
INTRAMUSCULAR | Status: AC
  Filled 2022-09-20: qty 2

## 2022-09-20 MED ORDER — MIDAZOLAM HCL 5 MG/5ML IJ SOLN
INTRAMUSCULAR | Status: DC | PRN
  Administered 2022-09-20: 2 mg via INTRAVENOUS

## 2022-09-20 MED ORDER — SODIUM CHLORIDE 0.9 % IV SOLN: INTRAVENOUS | Status: DC

## 2022-09-20 MED ORDER — PROPOFOL 500 MG/50ML IV EMUL
INTRAVENOUS | Status: AC
  Filled 2022-09-20: qty 50

## 2022-09-20 SURGICAL SUPPLY — 25 items

## 2022-09-20 NOTE — Op Note (Signed)
[facility] Patient Name: Chad Rivas Procedure Date: 09/20/2022 MRN: [id number] Attending MD: Shirley Friar , MD, [id number] Date of Birth: Dec 14, 1965 CSN: [id number] Age: 57 Admit Type: Outpatient Procedure:                Colonoscopy Indications:              Last colonoscopy: August 2020, Iron deficiency                            anemia Providers:                Shirley Friar, MD, Fransisca Connors, Beryle Beams, Technician, Evelina Dun CRNA Referring MD:             Sherie Don Henderson Medicines:                Propofol per Anesthesia, Monitored Anesthesia Care Complications:            No immediate complications. Estimated Blood Loss:     Estimated blood loss was minimal. Procedure:                Pre-Anesthesia Assessment:                           - Prior to the procedure, a History and Physical                            was performed, and patient medications and                            allergies were reviewed. The patient's tolerance of                            previous anesthesia was also reviewed. The risks                            and benefits of the procedure and the sedation                            options and risks were discussed with the patient.                            All questions were answered, and informed consent                            was obtained. Prior Anticoagulants: The patient has                            taken Eliquis (apixaban), last dose was 2 days                            prior to procedure. ASA Grade Assessment: III - A  patient with severe systemic disease. After                            reviewing the risks and benefits, the patient was                            deemed in satisfactory condition to undergo the                            procedure.                           After obtaining informed consent, the colonoscope                             was passed under direct vision. Throughout the                            procedure, the patient's blood pressure, pulse, and                            oxygen saturations were monitored continuously. The                            [id number]-HQ190L ([id number]) Olympus colonoscope was                            introduced through the anus and advanced to the the                            cecum, identified by appendiceal orifice and                            ileocecal valve. The colonoscopy was performed with                            difficulty due to a tortuous colon and the                            patient's combativeness. Successful completion of                            the procedure was aided by straightening and                            shortening the scope to obtain bowel loop                            reduction, performing the maneuvers documented                            (below) in this report, performing chin lift and  receiving assistance from additional staff. The                            patient tolerated the procedure. The quality of the                            bowel preparation was adequate and good. The                            ileocecal valve, appendiceal orifice, and rectum                            were photographed. Scope In: 10:43:50 AM Scope Out: 11:05:38 AM Total Procedure Duration: 0 hours 21 minutes 48 seconds  Findings:      The perianal and digital rectal examinations were normal.      Four semi-sessile polyps were found in the descending colon. The polyps       were 1 to 3 mm in size. These polyps were removed with a cold biopsy       forceps. Resection and retrieval were complete. Estimated blood loss was       minimal.      A 2 mm polyp was found in the sigmoid colon. The polyp was sessile. The       polyp was removed with a cold biopsy forceps. Resection and retrieval       were complete. Estimated blood loss was  minimal.      Internal hemorrhoids were found during retroflexion. The hemorrhoids       were small and Grade I (internal hemorrhoids that do not prolapse). Impression:               - Four 1 to 3 mm polyps in the descending colon,                            removed with a cold biopsy forceps. Resected and                            retrieved.                           - One 2 mm polyp in the sigmoid colon, removed with                            a cold biopsy forceps. Resected and retrieved.                           - Internal hemorrhoids. Moderate Sedation:      N/A - MAC procedure Recommendation:           - Patient has a contact number available for                            emergencies. The signs and symptoms of potential                            delayed complications were discussed with the  patient. Return to normal activities tomorrow.                            Written discharge instructions were provided to the                            patient.                           - High fiber diet.                           - Await pathology results.                           - Resume Eliquis (apixaban) at prior dose in 2 days. Procedure Code(s):        --- Professional ---                           [phone], Colonoscopy, flexible; with biopsy, single                            or multiple Diagnosis Code(s):        --- Professional ---                           D50.9, Iron deficiency anemia, unspecified                           D12.4, Benign neoplasm of descending colon                           D12.5, Benign neoplasm of sigmoid colon                           K64.0, First degree hemorrhoids CPT copyright 2022 [employer]. All rights reserved. The codes documented in this report are preliminary and upon coder review may  be revised to meet current compliance requirements. Shirley Friar, MD 09/20/2022 11:21:24 AM This report has been  signed electronically. Number of Addenda: 0

## 2022-09-20 NOTE — Discharge Instructions (Addendum)
YOU HAD AN ENDOSCOPIC PROCEDURE TODAY: Refer to the procedure report and other information in the discharge instructions given to you for any specific questions about what was found during the examination. If this information does not answer your questions, please call [facility] office at [phone] to clarify.   YOU SHOULD EXPECT: Some feelings of bloating in the abdomen. Passage of more gas than usual. Walking can help get rid of the air that was put into your GI tract during the procedure and reduce the bloating. If you had a lower endoscopy (such as a colonoscopy or flexible sigmoidoscopy) you may notice spotting of blood in your stool or on the toilet paper. Some abdominal soreness may be present for a day or two, also.  DIET: Your first meal following the procedure should be a light meal and then it is ok to progress to your normal diet. A half-sandwich or bowl of soup is an example of a good first meal. Heavy or fried foods are harder to digest and may make you feel nauseous or bloated. Drink plenty of fluids but you should avoid alcoholic beverages for 24 hours. If you had a esophageal dilation, please see attached instructions for diet.    ACTIVITY: Your care partner should take you home directly after the procedure. You should plan to take it easy, moving slowly for the rest of the day. You can resume normal activity the day after the procedure however YOU SHOULD NOT DRIVE, use power tools, machinery or perform tasks that involve climbing or major physical exertion for 24 hours (because of the sedation medicines used during the test).   SYMPTOMS TO REPORT IMMEDIATELY: A gastroenterologist can be reached at any hour. Please call [phone]  for any of the following symptoms:  Following lower endoscopy (colonoscopy, flexible sigmoidoscopy) Excessive amounts of blood in the stool  Significant tenderness, worsening of abdominal pains  Swelling of the abdomen that is new, acute  Fever of 100  or higher  Following upper endoscopy (EGD, EUS, ERCP, esophageal dilation) Vomiting of blood or coffee ground material  New, significant abdominal pain  New, significant chest pain or pain under the shoulder blades  Painful or persistently difficult swallowing  New shortness of breath  Black, tarry-looking or red, bloody stools  FOLLOW UP:  If any biopsies were taken you will be contacted by phone or by letter within the next 1-3 weeks. Call [phone]  if you have not heard about the biopsies in 3 weeks.  Please also call with any specific questions about appointments or follow up tests. YOU HAD AN ENDOSCOPIC PROCEDURE TODAY: Refer to the procedure report and other information in the discharge instructions given to you for any specific questions about what was found during the examination. If this information does not answer your questions, please call [facility] office at [phone] to clarify.   HOLD ELIQUIS for another 2 days. Restart on Thursday September 22, 2022. Start Omeprazole (Prilosec) 20 mg by mouth once a day (take 30 minutes before your breakfast). Obtain over-the-counter.

## 2022-09-20 NOTE — Anesthesia Procedure Notes (Signed)
Procedure Name: MAC Date/Time: 09/20/2022 10:15 AM  Performed by: Sindy Guadeloupe, CRNAPre-anesthesia Checklist: Patient identified, Emergency Drugs available, Suction available, Patient being monitored and Timeout performed Oxygen Delivery Method: Supernova nasal CPAP Placement Confirmation: positive ETCO2

## 2022-09-20 NOTE — Op Note (Signed)
[facility] Patient Name: Chad Rivas Procedure Date: 09/20/2022 MRN: [id number] Attending MD: Shirley Friar , MD, [id number] Date of Birth: 17-Feb-1966 CSN: [id number] Age: 57 Admit Type: Outpatient Procedure:                Upper GI endoscopy Indications:              Iron deficiency anemia, Dysphagia Providers:                Shirley Friar, MD, Fransisca Connors, Beryle Beams, Technician, Evelina Dun CRNA Referring MD:             Sherie Don Henderson Medicines:                Propofol per Anesthesia, Monitored Anesthesia Care Complications:            No immediate complications. Estimated Blood Loss:     Estimated blood loss was minimal. Procedure:                Pre-Anesthesia Assessment:                           - Prior to the procedure, a History and Physical                            was performed, and patient medications and                            allergies were reviewed. The patient's tolerance of                            previous anesthesia was also reviewed. The risks                            and benefits of the procedure and the sedation                            options and risks were discussed with the patient.                            All questions were answered, and informed consent                            was obtained. Prior Anticoagulants: The patient has                            taken Eliquis (apixaban), last dose was 2 days                            prior to procedure. ASA Grade Assessment: III - A                            patient with severe systemic disease. After  reviewing the risks and benefits, the patient was                            deemed in satisfactory condition to undergo the                            procedure.                           After obtaining informed consent, the endoscope was                            passed under direct vision.  Throughout the                            procedure, the patient's blood pressure, pulse, and                            oxygen saturations were monitored continuously. The                            [id number]-[id number] ([id number]) Olympus endoscope was introduced                            through the mouth, and advanced to the second part                            of duodenum. The upper GI endoscopy was                            accomplished without difficulty. The patient                            tolerated the procedure fairly well. Scope In: Scope Out: Findings:      LA Grade B (one or more mucosal breaks greater than 5 mm, not extending       between the tops of two mucosal folds) esophagitis with no bleeding was       found at the gastroesophageal junction.      The Z-line was found 42 cm from the incisors.      Segmental moderate inflammation characterized by congestion (edema),       erosions and erythema was found in the gastric antrum and in the       prepyloric region of the stomach. Biopsies were taken with a cold       forceps for histology. Estimated blood loss was minimal.      The cardia and gastric fundus were normal on retroflexion.      The examined duodenum was normal. Biopsies for histology were taken with       a cold forceps for evaluation of celiac disease. Estimated blood loss       was minimal. Impression:               - LA Grade B reflux esophagitis with no bleeding.                           -  Z-line, 42 cm from the incisors.                           - Gastritis. Biopsied.                           - Normal examined duodenum. Biopsied. Moderate Sedation:      N/A - MAC procedure Recommendation:           - Patient has a contact number available for                            emergencies. The signs and symptoms of potential                            delayed complications were discussed with the                            patient. Return to normal activities  tomorrow.                            Written discharge instructions were provided to the                            patient.                           - Follow an antireflux regimen.                           - Await pathology results.                           - Resume Eliquis (apixaban) at prior dose per other                            procedure report. Procedure Code(s):        --- Professional ---                           [phone], Esophagogastroduodenoscopy, flexible,                            transoral; with biopsy, single or multiple Diagnosis Code(s):        --- Professional ---                           D50.9, Iron deficiency anemia, unspecified                           K29.70, Gastritis, unspecified, without bleeding                           K21.00, Gastro-esophageal reflux disease with                            esophagitis, without bleeding  R13.10, Dysphagia, unspecified CPT copyright 2022 [employer]. All rights reserved. The codes documented in this report are preliminary and upon coder review may  be revised to meet current compliance requirements. Shirley Friar, MD 09/20/2022 11:14:46 AM This report has been signed electronically. Number of Addenda: 0

## 2022-09-20 NOTE — Transfer of Care (Signed)
Immediate Anesthesia Transfer of Care Note  Patient: Chad Rivas  Procedure(s) Performed: COLONOSCOPY WITH PROPOFOL ESOPHAGOGASTRODUODENOSCOPY (EGD) WITH PROPOFOL BIOPSY POLYPECTOMY  Patient Location: PACU and Endoscopy Unit  Anesthesia Type:MAC  Level of Consciousness: sedated and responds to stimulation  Airway & Oxygen Therapy: Patient Spontanous Breathing and Patient connected to face mask oxygen,OAW in place in PACU until pt awake.  Post-op Assessment: Report given to RN  Post vital signs: Reviewed and stable  Last Vitals:  Vitals Value Taken Time  BP 126/73 09/20/22 1119  Temp    Pulse 97 09/20/22 1121  Resp 21 09/20/22 1121  SpO2 100 % 09/20/22 1121  Vitals shown include unfiled device data.  Last Pain:  Vitals:   09/20/22 0850  TempSrc: Temporal  PainSc: 0-No pain         Complications: No notable events documented.

## 2022-09-20 NOTE — Anesthesia Postprocedure Evaluation (Signed)
Anesthesia Post Note  Patient: Chad Rivas  Procedure(s) Performed: COLONOSCOPY WITH PROPOFOL ESOPHAGOGASTRODUODENOSCOPY (EGD) WITH PROPOFOL BIOPSY POLYPECTOMY     Patient location during evaluation: PACU Anesthesia Type: MAC Level of consciousness: awake and alert Pain management: pain level controlled Vital Signs Assessment: post-procedure vital signs reviewed and stable Respiratory status: spontaneous breathing, nonlabored ventilation, respiratory function stable and patient connected to nasal cannula oxygen Cardiovascular status: stable and blood pressure returned to baseline Postop Assessment: no apparent nausea or vomiting Anesthetic complications: no   No notable events documented.  Last Vitals:  Vitals:   09/20/22 1140 09/20/22 1150  BP: (!) 148/92 (!) 143/95  Pulse: 89 81  Resp: 19 20  Temp:    SpO2: 93% 93%    Last Pain:  Vitals:   09/20/22 1140  TempSrc:   PainSc: 0-No pain                 [name]

## 2022-09-20 NOTE — Interval H&P Note (Signed)
History and Physical Interval Note:  09/20/2022 10:01 AM  Chad Rivas  has presented today for surgery, with the diagnosis of Iron deficiency anemia.  The various methods of treatment have been discussed with the patient and family. After consideration of risks, benefits and other options for treatment, the patient has consented to  Procedure(s): COLONOSCOPY WITH PROPOFOL (N/A) ESOPHAGOGASTRODUODENOSCOPY (EGD) WITH PROPOFOL (N/A) as a surgical intervention.  The patient's history has been reviewed, patient examined, no change in status, stable for surgery.  I have reviewed the patient's chart and labs.  Questions were answered to the patient's satisfaction.     Shirley Friar

## 2022-09-20 NOTE — H&P (Signed)
Date of Initial H&P: 09/15/22  History reviewed, patient examined, no change in status, stable for surgery.

## 2022-09-20 NOTE — Anesthesia Preprocedure Evaluation (Addendum)
Anesthesia Evaluation  Patient identified by MRN, date of birth, ID band Patient awake    Reviewed: Allergy & Precautions, NPO status , Patient's Chart, lab work & pertinent test results  Airway Mallampati: III  TM Distance: >3 FB Neck ROM: Full    Dental no notable dental hx.    Pulmonary sleep apnea    Pulmonary exam normal        Cardiovascular hypertension, Pt. on medications and Pt. on home beta blockers + dysrhythmias Atrial Fibrillation  Rhythm:Regular Rate:Normal     Neuro/Psych negative neurological ROS  negative psych ROS   GI/Hepatic negative GI ROS, Neg liver ROS,,,  Endo/Other  diabetes, Type 2, Insulin Dependent  Morbid obesity  Renal/GU negative Renal ROS  negative genitourinary   Musculoskeletal negative musculoskeletal ROS (+)    Abdominal Normal abdominal exam  (+)   Peds  Hematology  (+) Blood dyscrasia, anemia Lab Results      Component                Value               Date                      WBC                      11.0 (H)            01/18/2022                HGB                      9.7 (L)             01/18/2022                HCT                      31.6 (L)            01/18/2022                MCV                      76.7 (L)            01/18/2022                PLT                      323                 01/18/2022              Anesthesia Other Findings   Reproductive/Obstetrics                             Anesthesia Physical Anesthesia Plan  ASA: 3  Anesthesia Plan: MAC   Post-op Pain Management:    Induction: Intravenous  PONV Risk Score and Plan: 1 and Propofol infusion and Treatment may vary due to age or medical condition  Airway Management Planned: Simple Face Mask and Nasal Cannula  Additional Equipment: None  Intra-op Plan:   Post-operative Plan:   Informed Consent: I have reviewed the patients History and Physical, chart, labs  and discussed the procedure including the risks, benefits and alternatives for the proposed anesthesia with the  patient or authorized representative who has indicated his/her understanding and acceptance.     Dental advisory given  Plan Discussed with: CRNA  Anesthesia Plan Comments:        Anesthesia Quick Evaluation

## 2022-09-21 ENCOUNTER — Encounter (HOSPITAL_COMMUNITY): Payer: Self-pay | Admitting: Gastroenterology

## 2022-09-23 LAB — SURGICAL PATHOLOGY

## 2022-10-19 NOTE — Telephone Encounter (Signed)
No action done

## 2022-11-28 ENCOUNTER — Observation Stay (HOSPITAL_COMMUNITY)
Admission: EM | Admit: 2022-11-28 | Discharge: 2022-12-01 | Disposition: A | Discharge status: Home / Self Care | Payer: 59 | Attending: Family Medicine | Admitting: Family Medicine

## 2022-11-28 ENCOUNTER — Other Ambulatory Visit: Payer: Self-pay

## 2022-11-28 ENCOUNTER — Emergency Department (HOSPITAL_COMMUNITY): Payer: 59

## 2022-11-28 ENCOUNTER — Encounter (HOSPITAL_COMMUNITY): Payer: Self-pay

## 2022-11-28 ENCOUNTER — Other Ambulatory Visit (HOSPITAL_COMMUNITY): Payer: Self-pay | Admitting: Internal Medicine

## 2022-11-28 ENCOUNTER — Observation Stay (HOSPITAL_COMMUNITY): Payer: 59

## 2022-11-28 DIAGNOSIS — Z96641 Presence of right artificial hip joint: Secondary | ICD-10-CM | POA: Diagnosis not present

## 2022-11-28 DIAGNOSIS — R42 Dizziness and giddiness: Principal | ICD-10-CM | POA: Insufficient documentation

## 2022-11-28 DIAGNOSIS — Z7985 Long-term (current) use of injectable non-insulin antidiabetic drugs: Secondary | ICD-10-CM | POA: Diagnosis not present

## 2022-11-28 DIAGNOSIS — Z79899 Other long term (current) drug therapy: Secondary | ICD-10-CM | POA: Diagnosis not present

## 2022-11-28 DIAGNOSIS — I639 Cerebral infarction, unspecified: Secondary | ICD-10-CM | POA: Diagnosis not present

## 2022-11-28 DIAGNOSIS — E119 Type 2 diabetes mellitus without complications: Secondary | ICD-10-CM | POA: Insufficient documentation

## 2022-11-28 DIAGNOSIS — H5500 Unspecified nystagmus: Secondary | ICD-10-CM

## 2022-11-28 DIAGNOSIS — Z7901 Long term (current) use of anticoagulants: Secondary | ICD-10-CM | POA: Insufficient documentation

## 2022-11-28 DIAGNOSIS — Z794 Long term (current) use of insulin: Secondary | ICD-10-CM | POA: Diagnosis not present

## 2022-11-28 DIAGNOSIS — I5022 Chronic systolic (congestive) heart failure: Secondary | ICD-10-CM | POA: Diagnosis not present

## 2022-11-28 DIAGNOSIS — I110 Hypertensive heart disease with heart failure: Secondary | ICD-10-CM | POA: Diagnosis not present

## 2022-11-28 DIAGNOSIS — Z86718 Personal history of other venous thrombosis and embolism: Secondary | ICD-10-CM | POA: Diagnosis not present

## 2022-11-28 DIAGNOSIS — I5189 Other ill-defined heart diseases: Secondary | ICD-10-CM

## 2022-11-28 DIAGNOSIS — Z86711 Personal history of pulmonary embolism: Secondary | ICD-10-CM | POA: Insufficient documentation

## 2022-11-28 DIAGNOSIS — G4733 Obstructive sleep apnea (adult) (pediatric): Secondary | ICD-10-CM | POA: Diagnosis present

## 2022-11-28 DIAGNOSIS — I5020 Unspecified systolic (congestive) heart failure: Secondary | ICD-10-CM

## 2022-11-28 HISTORY — DX: Unspecified systolic (congestive) heart failure: I50.20

## 2022-11-28 LAB — ETHANOL: Alcohol, Ethyl (B): <10 mg/dL (ref <10)

## 2022-11-28 LAB — BASIC METABOLIC PANEL
Anion gap: 13 (ref 5–15)
BUN: 10 mg/dL (ref 6–20)
CO2: 26 mmol/L (ref 22–32)
Calcium: 8.5 mg/dL — ABNORMAL LOW (ref 8.9–10.3)
Chloride: 97 mmol/L — ABNORMAL LOW (ref 98–111)
Creatinine, Ser: 1.04 mg/dL (ref 0.61–1.24)
GFR, Estimated: >60 mL/min (ref >60)
Glucose, Bld: 218 mg/dL — ABNORMAL HIGH (ref 70–99)
Potassium: 3.9 mmol/L (ref 3.5–5.1)
Sodium: 136 mmol/L (ref 135–145)

## 2022-11-28 LAB — CBC
HCT: 40.8 % (ref 39.0–52.0)
Hemoglobin: 12.4 g/dL — ABNORMAL LOW (ref 13.0–17.0)
MCH: 24.3 pg — ABNORMAL LOW (ref 26.0–34.0)
MCHC: 30.4 g/dL (ref 30.0–36.0)
MCV: 79.8 fL — ABNORMAL LOW (ref 80.0–100.0)
Platelets: 344 10*3/uL (ref 150–400)
RBC: 5.11 MIL/uL (ref 4.22–5.81)
RDW: 15.1 % (ref 11.5–15.5)
WBC: 14.8 10*3/uL — ABNORMAL HIGH (ref 4.0–10.5)
nRBC: 0 % (ref 0.0–0.2)

## 2022-11-28 LAB — DIFFERENTIAL
Abs Immature Granulocytes: 0.09 10*3/uL — ABNORMAL HIGH (ref 0.00–0.07)
Basophils Absolute: 0 10*3/uL (ref 0.0–0.1)
Basophils Relative: 0 %
Eosinophils Absolute: 0 10*3/uL (ref 0.0–0.5)
Eosinophils Relative: 0 %
Immature Granulocytes: 1 %
Lymphocytes Relative: 12 %
Lymphs Abs: 1.7 10*3/uL (ref 0.7–4.0)
Monocytes Absolute: 0.8 10*3/uL (ref 0.1–1.0)
Monocytes Relative: 5 %
Neutro Abs: 12.2 10*3/uL — ABNORMAL HIGH (ref 1.7–7.7)
Neutrophils Relative %: 82 %

## 2022-11-28 LAB — I-STAT CHEM 8, ED
BUN: 14 mg/dL (ref 6–20)
Calcium, Ion: 1.03 mmol/L — ABNORMAL LOW (ref 1.15–1.40)
Chloride: 99 mmol/L (ref 98–111)
Creatinine, Ser: 1 mg/dL (ref 0.61–1.24)
Glucose, Bld: 218 mg/dL — ABNORMAL HIGH (ref 70–99)
HCT: 43 % (ref 39.0–52.0)
Hemoglobin: 14.6 g/dL (ref 13.0–17.0)
Potassium: 4.1 mmol/L (ref 3.5–5.1)
Sodium: 138 mmol/L (ref 135–145)
TCO2: 29 mmol/L (ref 22–32)

## 2022-11-28 LAB — TROPONIN I (HIGH SENSITIVITY): Troponin I (High Sensitivity): 6 ng/L (ref <18)

## 2022-11-28 LAB — CBG MONITORING, ED
Glucose-Capillary: 215 mg/dL — ABNORMAL HIGH (ref 70–99)
Glucose-Capillary: 182 mg/dL — ABNORMAL HIGH (ref 70–99)

## 2022-11-28 LAB — APTT: aPTT: 30 seconds (ref 24–36)

## 2022-11-28 LAB — PROTIME-INR
INR: 1.2 (ref 0.8–1.2)
Prothrombin Time: 15.3 seconds — ABNORMAL HIGH (ref 11.4–15.2)

## 2022-11-28 MED ORDER — INSULIN ASPART 100 UNIT/ML IJ SOLN
0.0000 [IU] | Freq: Three times a day (TID) | INTRAMUSCULAR | Status: DC
  Administered 2022-11-29 (×2): 2 [IU] via SUBCUTANEOUS
  Administered 2022-11-29 – 2022-11-30 (×3): 1 [IU] via SUBCUTANEOUS
  Administered 2022-11-30 – 2022-12-01 (×2): 2 [IU] via SUBCUTANEOUS
  Administered 2022-12-01: 1 [IU] via SUBCUTANEOUS

## 2022-11-28 MED ORDER — ACETAMINOPHEN 650 MG RE SUPP: 650.0000 mg | RECTAL | Status: DC | PRN

## 2022-11-28 MED ORDER — LORAZEPAM 2 MG/ML IJ SOLN
1.0000 mg | Freq: Once | INTRAMUSCULAR | Status: DC | PRN
  Filled 2022-11-28: qty 1

## 2022-11-28 MED ORDER — STROKE: EARLY STAGES OF RECOVERY BOOK
Freq: Once | Status: AC
  Filled 2022-11-28: qty 1

## 2022-11-28 MED ORDER — ACETAMINOPHEN 325 MG PO TABS
650.0000 mg | ORAL_TABLET | ORAL | Status: DC | PRN
  Administered 2022-11-30 – 2022-12-01 (×4): 650 mg via ORAL
  Filled 2022-11-28 (×4): qty 2

## 2022-11-28 MED ORDER — SODIUM CHLORIDE 0.9 % IV SOLN: 250.0000 mL | INTRAVENOUS | Status: DC | PRN

## 2022-11-28 MED ORDER — DIPHENHYDRAMINE HCL 25 MG PO CAPS
25.0000 mg | ORAL_CAPSULE | Freq: Four times a day (QID) | ORAL | Status: DC | PRN
  Administered 2022-11-29 – 2022-11-30 (×2): 25 mg via ORAL
  Filled 2022-11-28 (×2): qty 1

## 2022-11-28 MED ORDER — INSULIN GLARGINE-YFGN 100 UNIT/ML ~~LOC~~ SOLN
40.0000 [IU] | Freq: Every day | SUBCUTANEOUS | Status: DC
  Administered 2022-11-28 – 2022-11-30 (×3): 40 [IU] via SUBCUTANEOUS
  Filled 2022-11-28 (×4): qty 0.4

## 2022-11-28 MED ORDER — DIAZEPAM 2 MG PO TABS
2.0000 mg | ORAL_TABLET | Freq: Three times a day (TID) | ORAL | Status: DC | PRN
  Administered 2022-11-29: 2 mg via ORAL
  Filled 2022-11-28: qty 1

## 2022-11-28 MED ORDER — IOHEXOL 350 MG/ML SOLN
100.0000 mL | Freq: Once | INTRAVENOUS | Status: AC | PRN
  Administered 2022-11-28: 100 mL via INTRAVENOUS

## 2022-11-28 MED ORDER — SODIUM CHLORIDE 0.9% FLUSH
3.0000 mL | Freq: Two times a day (BID) | INTRAVENOUS | Status: DC
  Administered 2022-11-28 – 2022-12-01 (×6): 3 mL via INTRAVENOUS

## 2022-11-28 MED ORDER — MECLIZINE HCL 25 MG PO TABS
25.0000 mg | ORAL_TABLET | Freq: Three times a day (TID) | ORAL | Status: DC | PRN
  Administered 2022-11-28 – 2022-11-30 (×4): 25 mg via ORAL
  Filled 2022-11-28 (×6): qty 1

## 2022-11-28 MED ORDER — ONDANSETRON HCL 4 MG/2ML IJ SOLN
4.0000 mg | Freq: Once | INTRAMUSCULAR | Status: AC
  Administered 2022-11-28: 4 mg via INTRAVENOUS
  Filled 2022-11-28: qty 2

## 2022-11-28 MED ORDER — ACETAMINOPHEN 160 MG/5ML PO SOLN: 650.0000 mg | ORAL | Status: DC | PRN

## 2022-11-28 MED ORDER — SENNOSIDES-DOCUSATE SODIUM 8.6-50 MG PO TABS: 1.0000 | ORAL_TABLET | Freq: Every evening | ORAL | Status: DC | PRN

## 2022-11-28 MED ORDER — ATORVASTATIN CALCIUM 40 MG PO TABS
40.0000 mg | ORAL_TABLET | Freq: Every day | ORAL | Status: DC
  Administered 2022-11-29 – 2022-12-01 (×3): 40 mg via ORAL
  Filled 2022-11-28 (×3): qty 1

## 2022-11-28 MED ORDER — ONDANSETRON 4 MG PO TBDP
4.0000 mg | ORAL_TABLET | Freq: Once | ORAL | Status: AC | PRN
  Administered 2022-11-28: 4 mg via ORAL
  Filled 2022-11-28: qty 1

## 2022-11-28 MED ORDER — ONDANSETRON HCL 4 MG/2ML IJ SOLN
4.0000 mg | Freq: Four times a day (QID) | INTRAMUSCULAR | Status: DC | PRN
  Administered 2022-11-29: 4 mg via INTRAVENOUS
  Filled 2022-11-28: qty 2

## 2022-11-28 MED ORDER — INSULIN ASPART 100 UNIT/ML IJ SOLN: 0.0000 [IU] | Freq: Every day | INTRAMUSCULAR | Status: DC

## 2022-11-28 MED ORDER — SODIUM CHLORIDE 0.9% FLUSH: 3.0000 mL | INTRAVENOUS | Status: DC | PRN

## 2022-11-28 NOTE — Progress Notes (Signed)
   11/28/22 1740  Spiritual Encounters  Type of Visit Initial  Care provided to: Family  Referral source Trauma page  Reason for visit Trauma  OnCall Visit No   Chaplain responded to a level two trauma. The patient was attended to by the medical team. I met with the patient's spouse, Eber Jones. She was calmly awaiting for her husbands return. At this time she did not wish to speak with a chaplain, I advised her that is she requested a chaplain later in te evening someone would respond.   [facility]  [phone]

## 2022-11-28 NOTE — Consult Note (Signed)
NEUROLOGY CONSULTATION  Reason for Consult:CODE STROKE  CC: dizziness, chest pain, emesis  HPI   Chad Rivas is a 57 y.o. male with a past medical history of DM2,  OSA, history of DVT, morbid obesity who presented to the ED with acute onset of dizziness, nausea, unsteadiness. Per chart, patient was diaphoretic and hypertensive on presentation to ED.  On exam, patient has a slight left facial droop and shows drift in his LLE, NIH 2. He states the dizziness started around 1500. Patient c/o nausea in CT scanner and did vomit x 1, after receiving zofran. Patient was a very difficult IV stick, delay in getting CTA due to this.  CT Head negative. CT Angio showed no LVO  LKW:1500 TNK?: No, on Eliquis Thrombectomy?: No, no LVO.   History is obtained from:patient and chart review.   ROS: Pertinent items noted in HPI and remainder of comprehensive ROS otherwise negative.   Past Medical History:  Past Medical History:  Diagnosis Date   Diabetes mellitus without complication (HCC)    Sleep apnea    pt has CPAP but does not use     Family History Family History  Problem Relation Age of Onset   Cancer Father     Allergies:  Allergies  Allergen Reactions   Penicillins Anaphylaxis and Other (See Comments)    Has patient had a PCN reaction causing immediate rash, facial/tongue/throat swelling, SOB or lightheadedness with hypotension: Y Has patient had a PCN reaction causing severe rash involving mucus membranes or skin necrosis: Y Has patient had a PCN reaction that required hospitalization: Y Has patient had a PCN reaction occurring within the last 10 years: N If all of the above answers are "NO", then may proceed with Cephalosporin use.   Pt states he had hives that required Benadryl treatment     Bactrim [Sulfamethoxazole-Trimethoprim] Hives   Invokana [Canagliflozin] Other (See Comments)    Hx of Fournier's gangrene    Penicillin G Procaine Hives   Losartan Other (See  Comments)    Cause angioedema    Social History:   reports that he has never smoked. He has never used smokeless tobacco. He reports that he does not drink alcohol and does not use drugs.    Medications (Not in a hospital admission)   EXAMINATION  Current vital signs:    11/28/2022    4:41 PM 09/20/2022   11:50 AM 09/20/2022   11:40 AM  Vitals with BMI  Systolic 164 143 784  Diastolic 80 95 92  Pulse 80 81 89    Examination:  GENERAL: Awake, alert in NAD HEENT: - Normocephalic and atraumatic, dry mm LUNGS - Regular, unlabored respirations CV - S1S2 RRR, equal pulses bilaterally. ABDOMEN - Soft, nontender, nondistended with normoactive BS Ext: warm, well perfused, intact peripheral pulses, no pedal edema Integumentary:  Skin intact on clothed exam  NEURO:  Mental Status: AA&Ox3  Language: speech is clear.  Intact naming, repetition, fluency, and comprehension. Cranial Nerves:  II: PERRL. Visual fields full to confrontation.  III, IV, VI: EOM intact. Eyelids elevate symmetrically. Blinks to threat.  V: Sensation intact V1-3 symmetrically  VII: slight left facial droop VIII: hearing intact to voice IX, X: Palate elevates symmetrically. Phonation is normal.  ON:GEXBMWUX shrug 5/5 and symmetrical  XII: tongue is midline without fasciculations. Motor:  Slight drift seen in LLE with 4/5 strength throughout.  No drift seen in any other extremity.   Tone: is normal and bulk is normal Sensation- Intact  to light touch bilaterally Coordination: FTN intact bilaterally, no ataxia in BLE., no abnormal movements  Gait- deferred   LABS  I have reviewed labs in epic and the results pertinent to this consultation are: CBG: 215   No results found for: "LDLCALC" Lab Results  Component Value Date   ALT 18 01/16/2022   AST 19 01/16/2022   ALKPHOS 94 01/16/2022   BILITOT 2.4 (H) 01/16/2022   Lab Results  Component Value Date   HGBA1C 11.6 (H) 01/15/2022   Lab Results   Component Value Date   WBC 11.0 (H) 01/18/2022   HGB 9.7 (L) 01/18/2022   HCT 31.6 (L) 01/18/2022   MCV 76.7 (L) 01/18/2022   PLT 323 01/18/2022   No results found for: "VITAMINB12" No results found for: "FOLATE" Lab Results  Component Value Date   NA 135 01/18/2022   K 3.2 (L) 01/18/2022   CL 100 01/18/2022   CO2 29 01/18/2022   A1c  DIAGNOSTIC IMAGING/PROCEDURES  I have reviewed the images obtained:, as below    CT-head 1. No evidence of acute intracranial abnormality. 2. ASPECTS is 10.  CTA head and neck  No emergent large vessel occlusion or proximal hemodynamically significant stenosis.   Assessment:  Chad Rivas is a 57 y.o. male with a past medical history of DM2,  OSA, history of DVT, morbid obesity who presented to the ED with acute onset of dizziness, nausea, unsteadiness. CT and CTA negative.    Likely posterior circulation ischemic stroke  Recommendations: - Frequent Neuro checks per stroke unit protocol - MRI Brain  - TTE - Lipid panel - Statin - will be started if LDL>70 or otherwise medically indicated - A1C - Antithrombotic - hold eliquis pending MRI - DVT ppx - hold eliquis pending MRI  - Smoking cessation - will counsel patient - SBP goal - <220, PRN labetalol if HR>60 and PRN Hydralazine if HR<60 - Telemetry monitoring for arrhythmia - 72h - Swallow screen - will be performed prior to PO intake - Stroke education - will be given - PT/OT/SLP - Dispo: admit to medicine for stroke workup  Stroke team will follow starting tomorrow  Bing Neighbors, MD [facility] [phone]  If 7pm- 7am, please page neurology on call as listed in AMION.

## 2022-11-28 NOTE — ED Provider Triage Note (Signed)
Emergency Medicine Provider Triage Evaluation Note  Chad Rivas , a 57 y.o. male  was evaluated in triage.  Pt complains of dizziness. Started at work. Acute onset dizziness and vomiting. Chest pain. Vomiting q38mins. Hx of DVTs on eliquis and HTN. 1 hour. Review of Systems  Positive: Dizziness.  Negative: AP/Urinary Sxs  Physical Exam  BP (!) 164/80   Pulse 80   Temp 97.8 F (36.6 C)   Resp 16   SpO2 91%  Gen:   Awake, no distress   Resp:  Normal effort  MSK:   Moves extremities without difficulty  Other:  Nystagmus and ataxic  Medical Decision Making  Medically screening exam initiated at 5:13 PM.  Appropriate orders placed.  Chad Rivas was informed that the remainder of the evaluation will be completed by another provider, this initial triage assessment does not replace that evaluation, and the importance of remaining in the ED until their evaluation is complete.  Activated as code stroke. Neurology at   Glyn Ade, MD 11/29/22 [phone]

## 2022-11-28 NOTE — Code Documentation (Signed)
Stroke Response Nurse Documentation Code Documentation  Chad Rivas is a 57 y.o. male arriving to [facility]  via [facility] with history of DM, DVT, takes Eliquis daily, last dose this AM. Code stroke activated by ED. Patient from work where he was performing normal duties when suddenly became dizzy and had episode of emesis 1520. Patient very diaphoretic with left droop and left leg weakness upon arrival.    Stroke team at the bedside on patient activation. Patient cleared for CT by Dr. Doran Durand, to CT. NIH 2, see flowsheet for details. CT/CTA completed. Delay in some scans d/t IV access, labs obtained with IV access. No TNK d/t Eliquis. No thrombectomy d/t no LVO. Care Plan: q2h NIH/vitals. Bedside handoff with ED RN Rodney Booze.    Scarlette Slice K  Rapid Response RN

## 2022-11-28 NOTE — ED Notes (Signed)
PT passed swallow screen.

## 2022-11-28 NOTE — H&P (Signed)
History and Physical    Chad Rivas NWG:[id number] DOB: 08-25-1965 DOA: 11/28/2022  PCP: Emilio Aspen, MD   Patient coming from: Home   Chief Complaint: Dizziness, N/V   HPI: Chad Rivas is a 57 y.o. male with medical history significant for insulin-dependent diabetes mellitus, history of DVT and PE on Eliquis, OSA, chronic HFmrEF, and BMI 49 who presents with acute onset of dizziness, nausea, and vomiting.  Patient was in his usual state today until this afternoon when he developed acute onset of dizziness, nausea, vomiting, and difficulty with balance.  He denied any associated headache or focal numbness or weakness.  He had never experienced this previously.  He denies any recent fever or illness.  He developed a vague chest discomfort on arrival to the ED which has since resolved without intervention.  ED Course: Upon arrival to the ED, patient is found to be afebrile and saturating mid 90s on room air with normal heart rate and stable blood pressure.  EKG demonstrates sinus rhythm with LAFB.  Chest x-ray is negative for acute cardiopulmonary disease.  No acute findings are noted on head CT or CTA head and neck.  Labs are most notable for glucose 218 and WBC 14,800.  Neurology evaluated the patient in the emergency department and recommended medical admission for further evaluation and management of suspected posterior circulation stroke.  Review of Systems:  All other systems reviewed and apart from HPI, are negative.  Past Medical History:  Diagnosis Date   Diabetes mellitus without complication (HCC)    Heart failure with mildly reduced ejection fraction (HFmrEF) (HCC) 11/28/2022   History of pulmonary embolism 01/15/2022   Sleep apnea    pt has CPAP but does not use     Past Surgical History:  Procedure Laterality Date   BIOPSY  09/20/2022   Procedure: BIOPSY;  Surgeon: Charlott Rakes, MD;  Location: [facility];  Service: Gastroenterology;;   COLONOSCOPY  WITH PROPOFOL N/A 09/20/2022   Procedure: COLONOSCOPY WITH PROPOFOL;  Surgeon: Charlott Rakes, MD;  Location: [facility];  Service: Gastroenterology;  Laterality: N/A;   ESOPHAGOGASTRODUODENOSCOPY (EGD) WITH PROPOFOL N/A 09/20/2022   Procedure: ESOPHAGOGASTRODUODENOSCOPY (EGD) WITH PROPOFOL;  Surgeon: Charlott Rakes, MD;  Location: [facility];  Service: Gastroenterology;  Laterality: N/A;   IRRIGATION AND DEBRIDEMENT ABSCESS Right 07/13/2017   Procedure: INCISION AND DRAINAGE OF GROIN ABSCESS;  Surgeon: Ihor Gully, MD;  Location: [facility];  Service: Urology;  Laterality: Right;   IRRIGATION AND DEBRIDEMENT ABSCESS N/A 01/15/2022   Procedure: IRRIGATION AND DEBRIDEMENT ABSCESS;  Surgeon: Crista Elliot, MD;  Location: [facility];  Service: Urology;  Laterality: N/A;   JOINT REPLACEMENT  2009   R hip   POLYPECTOMY  09/20/2022   Procedure: POLYPECTOMY;  Surgeon: Charlott Rakes, MD;  Location: [facility];  Service: Gastroenterology;;   TOE SURGERY     TOTAL HIP ARTHROPLASTY      Social History:   reports that he has never smoked. He has never used smokeless tobacco. He reports that he does not drink alcohol and does not use drugs.  Allergies  Allergen Reactions   Penicillins Anaphylaxis and Other (See Comments)    Has patient had a PCN reaction causing immediate rash, facial/tongue/throat swelling, SOB or lightheadedness with hypotension: Y Has patient had a PCN reaction causing severe rash involving mucus membranes or skin necrosis: Y Has patient had a PCN reaction that required hospitalization: Y Has patient had a PCN reaction occurring within the last 10 years:  N If all of the above answers are "NO", then may proceed with Cephalosporin use.   Pt states he had hives that required Benadryl treatment     Bactrim [Sulfamethoxazole-Trimethoprim] Hives   Invokana [Canagliflozin] Other (See Comments)    Hx of Fournier's gangrene    Penicillin G Procaine Hives   Losartan Other  (See Comments)    Cause angioedema    Family History  Problem Relation Age of Onset   Cancer Father      Prior to Admission medications   Medication Sig Start Date End Date Taking? Authorizing Provider  apixaban (ELIQUIS) 5 MG TABS tablet Take 1 tablet (5 mg total) by mouth 2 (two) times daily. 08/19/20  Yes Leroy Sea, MD  atorvastatin (LIPITOR) 40 MG tablet Take 1 tablet (40 mg total) by mouth daily. 01/10/22  Yes Yates Decamp, MD  carvedilol (COREG) 3.125 MG tablet Take 1 tablet (3.125 mg total) by mouth 2 (two) times daily with a meal. 08/27/20  Yes Cantwell, Celeste C, PA-C  diphenhydrAMINE (BENADRYL) 25 MG tablet Take 25 mg by mouth every 6 (six) hours as needed for itching or allergies.   Yes [provider]  EPINEPHrine (EPIPEN 2-PAK) 0.3 mg/0.3 mL IJ SOAJ injection Inject 0.3 mg into the muscle once as needed (For anaphylaxis.). 08/13/20  Yes Leroy Sea, MD  fluticasone (FLONASE) 50 MCG/ACT nasal spray Place 1 spray into both nostrils daily as needed for allergies.  06/06/17  Yes [provider]  HUMALOG KWIKPEN 200 UNIT/ML KwikPen CHECK BLOOD GLUCOSE: LESS THAN 150 THEN 0 UNITS, IF 151-200 THEN INJECT 3 UNITS, 201-250 INJECT 5 UNITS; 251-300 INJECT 7 UNITS; 301-350 INJECT 9 UNITS; 301-400 INJECT 11 UNITS. PATIENT TO CHECK HIS BLOOD GLUCOSE 3 TIMES A DAY BEFORE EACH MEAL. 01/10/22  Yes [provider]  insulin glargine (LANTUS SOLOSTAR) 100 UNIT/ML Solostar Pen Inject 0.3 mLs (30 Units total) into the skin at bedtime. Patient taking differently: Inject 60 Units into the skin at bedtime. 08/13/20  Yes Leroy Sea, MD  loratadine (CLARITIN) 10 MG tablet Take 10 mg by mouth daily. 11/05/20  Yes [provider]  Multiple Vitamins-Minerals (MULTIVITAMIN ADULT) TABS Take 1 tablet by mouth daily.   Yes [provider]  oxymetazoline (AFRIN) 0.05 % nasal spray Place 1 spray into both nostrils 2 (two) times daily as needed for congestion.    Yes [provider]  TRULICITY 0.75 MG/0.5ML SOPN Inject 0.75 mg into the skin once a week. thursdays 11/04/22  Yes [provider]  blood glucose meter kit and supplies KIT Dispense based on patient and insurance preference. Use up to four times daily as directed. (FOR ICD-9 250.00, 250.01). For QAC - HS accuchecks. 08/13/20   Leroy Sea, MD  glucose blood test strip Use four times daily as directed 08/13/20   Leroy Sea, MD  Insulin Pen Needle 32G X 4 MM MISC Use for subcutaneous insulin 4 times daily. 08/13/20   Leroy Sea, MD  Lancet Devices (AUTOLET MINI) MISC Use as directed 08/13/20   Leroy Sea, MD  TRUEplus Lancets 28G MISC Use as directed four times daily 08/13/20   Leroy Sea, MD    Physical Exam: Vitals:   11/28/22 1640 11/28/22 1641 11/28/22 1730 11/28/22 1839  BP:  (!) 164/80 (!) 153/76   Pulse:  80 88   Resp:  16    Temp:  97.8 F (36.6 C)    SpO2: 98% 91%  Weight:    (!) 160.6 kg  Height:    5\' 11"  (1.803 m)    Constitutional: NAD, calm  Eyes: PERTLA, lids and conjunctivae normal ENMT: Mucous membranes are moist. Posterior pharynx clear of any exudate or lesions.   Neck: supple, no masses  Respiratory: no wheezing, no crackles. No accessory muscle use.  Cardiovascular: S1 & S2 heard, regular rate and rhythm. No extremity edema.   Abdomen: No distension, no tenderness, soft. Bowel sounds active.  Musculoskeletal: no clubbing / cyanosis. No joint deformity upper and lower extremities.   Skin: no significant rashes, lesions, ulcers. Warm, dry, well-perfused. Neurologic: CN 2-12 grossly intact. Sensation intact. Strength 5/5 in all 4 limbs. Alert and oriented.  Psychiatric: Pleasant. Cooperative.    Labs and Imaging on Admission: I have personally reviewed following labs and imaging studies  CBC: Recent Labs  Lab 11/28/22 1849 11/28/22 1915  WBC 14.8*  --   NEUTROABS 12.2*  --   HGB 12.4* 14.6  HCT 40.8 43.0  MCV  79.8*  --   PLT 344  --    Basic Metabolic Panel: Recent Labs  Lab 11/28/22 1849 11/28/22 1915  NA 136 138  K 3.9 4.1  CL 97* 99  CO2 26  --   GLUCOSE 218* 218*  BUN 10 14  CREATININE 1.04 1.00  CALCIUM 8.5*  --    GFR: Estimated Creatinine Clearance: 129.2 mL/min (by C-G formula based on SCr of 1 mg/dL). Liver Function Tests: No results for input(s): "AST", "ALT", "ALKPHOS", "BILITOT", "PROT", "ALBUMIN" in the last 168 hours. No results for input(s): "LIPASE", "AMYLASE" in the last 168 hours. No results for input(s): "AMMONIA" in the last 168 hours. Coagulation Profile: Recent Labs  Lab 11/28/22 1849  INR 1.2   Cardiac Enzymes: No results for input(s): "CKTOTAL", "CKMB", "CKMBINDEX", "TROPONINI" in the last 168 hours. BNP (last 3 results) No results for input(s): "PROBNP" in the last 8760 hours. HbA1C: No results for input(s): "HGBA1C" in the last 72 hours. CBG: Recent Labs  Lab 11/28/22 1728  GLUCAP 215*   Lipid Profile: No results for input(s): "CHOL", "HDL", "LDLCALC", "TRIG", "CHOLHDL", "LDLDIRECT" in the last 72 hours. Thyroid Function Tests: No results for input(s): "TSH", "T4TOTAL", "FREET4", "T3FREE", "THYROIDAB" in the last 72 hours. Anemia Panel: No results for input(s): "VITAMINB12", "FOLATE", "FERRITIN", "TIBC", "IRON", "RETICCTPCT" in the last 72 hours. Urine analysis:    Component Value Date/Time   COLORURINE AMBER (A) 01/15/2022 1410   APPEARANCEUR CLEAR 01/15/2022 1410   LABSPEC 1.027 01/15/2022 1410   PHURINE 6.0 01/15/2022 1410   GLUCOSEU >=500 (A) 01/15/2022 1410   HGBUR MODERATE (A) 01/15/2022 1410   BILIRUBINUR NEGATIVE 01/15/2022 1410   KETONESUR 5 (A) 01/15/2022 1410   PROTEINUR 100 (A) 01/15/2022 1410   NITRITE NEGATIVE 01/15/2022 1410   LEUKOCYTESUR NEGATIVE 01/15/2022 1410   Sepsis Labs: @LABRCNTIP (procalcitonin:4,lacticidven:4) )No results found for this or any previous visit (from the past 240 hour(s)).   Radiological  Exams on Admission: DG Chest Port 1 View  Result Date: 11/28/2022 CLINICAL DATA:  Chest pain EXAM: PORTABLE CHEST 1 VIEW COMPARISON:  08/10/2020 FINDINGS: The heart size and mediastinal contours are within normal limits. Both lungs are clear. The visualized skeletal structures are unremarkable. IMPRESSION: No active disease. Electronically Signed   By: Helyn Numbers M.D.   On: 11/28/2022 20:29   CT Angio Chest/Abd/Pel for Dissection W and/or W/WO  Result Date: 11/28/2022 CLINICAL DATA:  Dizziness, chest pain, vomiting, left leg weakness EXAM: CT ANGIOGRAPHY  CHEST, ABDOMEN AND PELVIS TECHNIQUE: Non-contrast CT of the chest was initially obtained. Multidetector CT imaging through the chest, abdomen and pelvis was performed using the standard protocol during bolus administration of intravenous contrast. Multiplanar reconstructed images and MIPs were obtained and reviewed to evaluate the vascular anatomy. RADIATION DOSE REDUCTION: This exam was performed according to the departmental dose-optimization program which includes automated exposure control, adjustment of the mA and/or kV according to patient size and/or use of iterative reconstruction technique. CONTRAST:  OMNIPAQUE IOHEXOL 350 MG/ML SOLN COMPARISON:  None Available. FINDINGS: CTA CHEST FINDINGS Cardiovascular: There is preferential opacification of the thoracic aorta. No intramural hematoma, dissection, or aneurysm. Mild atherosclerotic calcification within the proximal descending thoracic aorta. No significant coronary artery calcification. Global cardiac size within normal limits. No pericardial effusion. Central pulmonary arteries are of normal caliber. Poor opacification the pulmonary arterial tree, as this examination was designed for assessment of the thoracic aorta, precludes evaluation for pulmonary embolism. Mediastinum/Nodes: No enlarged mediastinal, hilar, or axillary lymph nodes. Thyroid gland, trachea, and esophagus demonstrate no  significant findings. Lungs/Pleura: Lungs are clear. No pleural effusion or pneumothorax. Musculoskeletal: No chest wall abnormality. No acute or significant osseous findings. Review of the MIP images confirms the above findings. CTA ABDOMEN AND PELVIS FINDINGS VASCULAR Aorta: Normal caliber aorta without aneurysm, dissection, vasculitis or significant stenosis. Celiac: Patent without evidence of aneurysm, dissection, vasculitis or significant stenosis. SMA: Patent without evidence of aneurysm, dissection, vasculitis or significant stenosis. Renals: Both renal arteries are patent without evidence of aneurysm, dissection, vasculitis, fibromuscular dysplasia or significant stenosis. IMA: Patent without evidence of aneurysm, dissection, vasculitis or significant stenosis. Inflow: Patent without evidence of aneurysm, dissection, vasculitis or significant stenosis. Veins: No obvious venous abnormality within the limitations of this arterial phase study. Review of the MIP images confirms the above findings. NON-VASCULAR Hepatobiliary: Evaluation is limited by the patient's body habitus and phase of contrast enhancement. Allowing for this, the liver and gallbladder are unremarkable. No intra or extrahepatic biliary ductal dilation. Pancreas: Unremarkable Spleen: Unremarkable Adrenals/Urinary Tract: The adrenal glands are unremarkable. The kidneys are normal. Bladder is partially obscured by streak artifact, however, the visualized portion is unremarkable. Stomach/Bowel: Stomach is within normal limits. Appendix appears normal. No evidence of bowel wall thickening, distention, or inflammatory changes. No free intraperitoneal gas or fluid. Lymphatic: No pathologic adenopathy within the abdomen and pelvis. Reproductive: Obscured by streak artifact Other: Tiny fat containing umbilical hernia. Musculoskeletal: Surgical changes of right total hip arthroplasty are identified. Osseous structures are otherwise age-appropriate. No  acute bone abnormality. Review of the MIP images confirms the above findings. IMPRESSION: 1. Normal examination of the thoracoabdominal aorta. No evidence of aortic dissection or aneurysm. 2. No acute intrathoracic or intra-abdominal pathology identified. Aortic Atherosclerosis (ICD10-I70.0). Electronically Signed   By: Helyn Numbers M.D.   On: 11/28/2022 20:29   CT ANGIO HEAD NECK W WO CM (CODE STROKE)  Result Date: 11/28/2022 CLINICAL DATA:  Stroke, follow up EXAM: CT ANGIOGRAPHY HEAD AND NECK WITH AND WITHOUT CONTRAST TECHNIQUE: Multidetector CT imaging of the head and neck was performed using the standard protocol during bolus administration of intravenous contrast. Multiplanar CT image reconstructions and MIPs were obtained to evaluate the vascular anatomy. Carotid stenosis measurements (when applicable) are obtained utilizing NASCET criteria, using the distal internal carotid diameter as the denominator. RADIATION DOSE REDUCTION: This exam was performed according to the departmental dose-optimization program which includes automated exposure control, adjustment of the mA and/or kV according to patient size and/or use  of iterative reconstruction technique. CONTRAST:  OMNIPAQUE IOHEXOL 350 MG/ML SOLN COMPARISON:  Same day CT head. FINDINGS: CTA NECK FINDINGS Aortic arch: Great vessel origins are patent without significant stenosis. Right carotid system: No evidence of dissection, stenosis (50% or greater), or occlusion. Left carotid system: No evidence of dissection, stenosis (50% or greater), or occlusion. Vertebral arteries: Left dominant. Both vertebral arteries are patent without greater than 50% stenosis. Evaluation is limited by streak artifact through the vertebral arteries. Skeleton: Negative. Other neck: Negative. Upper chest: Visualized lung apices are clear. Review of the MIP images confirms the above findings CTA HEAD FINDINGS Anterior circulation: Bilateral intracranial ICAs, MCAs, and  ACAs are patent without proximal hemodynamically significant stenosis. Posterior circulation: Bilateral intradural vertebral arteries, basilar artery and bilateral posterior cerebral arteries are patent without proximal hemodynamically significant stenosis. Right intradural vertebral artery is non dominant and tortuous proximally. Venous sinuses: As permitted by contrast timing, patent. Review of the MIP images confirms the above findings IMPRESSION: No emergent large vessel occlusion or proximal hemodynamically significant stenosis. Findings discussed with Dr. Selina Cooley via telephone at 06:25 p.m. Electronically Signed   By: Feliberto Harts M.D.   On: 11/28/2022 18:37   CT HEAD CODE STROKE WO CONTRAST  Result Date: 11/28/2022 CLINICAL DATA:  Code stroke.  Neuro deficit, acute, stroke suspected EXAM: CT HEAD WITHOUT CONTRAST TECHNIQUE: Contiguous axial images were obtained from the base of the skull through the vertex without intravenous contrast. RADIATION DOSE REDUCTION: This exam was performed according to the departmental dose-optimization program which includes automated exposure control, adjustment of the mA and/or kV according to patient size and/or use of iterative reconstruction technique. COMPARISON:  None Available. FINDINGS: Brain: No evidence of acute infarction, hemorrhage, hydrocephalus, extra-axial collection or mass lesion/mass effect. Vascular: No hyperdense vessel identified. Skull: No acute fracture. Sinuses/Orbits: No acute finding. Other: None. ASPECTS [facility] Stroke Program Early CT Score) Total score (0-10 with 10 being normal): 10. IMPRESSION: 1. No evidence of acute intracranial abnormality. 2. ASPECTS is 10. Code stroke imaging results were communicated on 11/28/2022 at 5:53 pm to provider [facility] via secure text paging. Electronically Signed   By: Feliberto Harts M.D.   On: 11/28/2022 17:54    EKG: Independently reviewed. Sinus rhythm, LAFB.   Assessment/Plan   1. Vertigo  -  Concerning for posterior circulation CVA  - Continue cardiac monitoring and frequent neuro checks, check MRI brain, TTE, lipids, and A1c, continue Lipitor, hold Eliquis pending MRI, consult PT/OT/SLP, permit HTN in acute-phase  2. Chronic HFmrEF  - Appears compensated   - Hold Coreg in acute-phase of suspected CVA, monitor volume status    3. Insulin-dependent DM  - A1c was 11.6% in November 2023  - Check CBGs and use long- and short-acting insulin   4. Hx of recurrent DVT and PE  - Hold Eliquis pending MRI   5. Chest pain  - Patient developed chest discomfort on arrival to ED  - Initial troponin is normal, no acute ischemic features noted on EKG, and no acute findings are noted on CTA chest/abd/pelvis - Continue cardiac monitoring, repeat troponin, follow-up echo findings   6. OSA  - CPAP while sleeping    DVT prophylaxis: SCDs  Code Status: Full  Level of Care: Level of care: Telemetry Medical Family Communication: Wife at bedside    Disposition Plan:  Patient is from: Home  Anticipated d/c is to: TBD Anticipated d/c date is: 9/24 or 11/30/22  Patient currently: Pending CVA workup  Consults called:  Neurology  Admission status: Observation     Briscoe Deutscher, MD [facility]  11/28/2022, 9:51 PM

## 2022-11-28 NOTE — ED Provider Notes (Incomplete)
EMERGENCY DEPARTMENT AT [facility] Provider Note   CSN: [id number] Arrival date & time: 11/28/22  1637     History Chief Complaint  Patient presents with  . Chest Pain  . Emesis  . Dizziness    HPI Chad Rivas is a 57 y.o. male presenting for acute onset dizziness that started about an hour ago. He describes he went to out of his car to the field to work and then came back to his car. Suddenly he felt that everything was dizzy, nauseous, vomiting and has not been steady on his feet. He denies any sick contacts, any new foods, any fevers, chills, abdominal pain. He denies this ever happening to him.    Patient's recorded medical, surgical, social, medication list and allergies were reviewed in the Snapshot window as part of the initial history.   Review of Systems   Review of Systems  Constitutional:  Positive for activity change, diaphoresis and fatigue. Negative for chills and fever.  Respiratory:  Positive for chest tightness. Negative for cough and shortness of breath.   Cardiovascular:  Positive for chest pain. Negative for leg swelling.  Gastrointestinal:  Positive for nausea and vomiting. Negative for abdominal distention, abdominal pain and diarrhea.  Endocrine: Negative for polydipsia and polyuria.  Genitourinary:  Negative for difficulty urinating.  Musculoskeletal:  Positive for gait problem. Negative for back pain.  Neurological:  Positive for weakness and light-headedness. Negative for dizziness, tremors, facial asymmetry, numbness and headaches.    Physical Exam Updated Vital Signs BP (!) 153/76 (BP Location: Left Arm)   Pulse 88   Temp 97.8 F (36.6 C)   Resp 16   SpO2 91%  Physical Exam Constitutional:      General: He is in acute distress.     Appearance: He is obese. He is ill-appearing, toxic-appearing and diaphoretic.  Eyes:     General: No visual field deficit. Cardiovascular:     Rate and Rhythm: Regular rhythm. Tachycardia  present.     Heart sounds: Heart sounds not distant. No murmur heard.    No friction rub. No gallop.  Pulmonary:     Effort: Tachypnea present. No respiratory distress.     Breath sounds: No stridor. No decreased breath sounds, wheezing or rhonchi.  Chest:     Chest wall: No mass or tenderness.  Abdominal:     General: Bowel sounds are normal.     Palpations: Abdomen is soft. There is no hepatomegaly, splenomegaly or mass.     Tenderness: There is no abdominal tenderness. There is no guarding.  Neurological:     Mental Status: He is lethargic.     Cranial Nerves: No dysarthria or facial asymmetry.     Sensory: Sensation is intact.     Motor: Tremor present. No weakness, atrophy, abnormal muscle tone, seizure activity or pronator drift.     Coordination: Finger-Nose-Finger Test and Heel to Custer Test normal.     Gait: Gait abnormal.     Comments: Rotary nystagmus.     ED Course/ Medical Decision Making/ A&P Clinical Course as of 11/28/22 1854  Mon Nov 28, 2022  5284 I was approached by the resident due to severity of patient's presentation. Patient presenting with 2 hours of severe vertigo sudden onset with ataxia dizziness nausea vomiting that is profound. I activated as a code stroke due to symptoms. Neurology at bedside immediately upon activation.  Agree with diagnosis.  Large vessel imaging completed due to event presence of  diaphoresis to rule out vascular disease including vascular dissection in the setting of anticoagulation use. Unfortunately patient is not a candidate for tenecteplase due to comorbid utilization of anticoagulation which he stated he is compliant on. Will continue supportive care in emergency room and arrange for admission to medicine on completion of scans.    [CC]    Clinical Course User Index [CC] Glyn Ade, MD    Procedures Procedures   Medications Ordered in ED Medications  ondansetron (ZOFRAN-ODT) disintegrating tablet 4 mg (4 mg Oral  Given 11/28/22 1721)    Medical Decision Making:    Chad Rivas is a 57 y.o. male who presented to the ED today with acute onset dizziness and vomiting as detailed above.     Complete initial physical exam performed, notably the patient  was concerning for a stroke. A code stroke was called.    Reviewed and confirmed nursing documentation for past medical history, family history, social history.    Initial Assessment:   With the patient's presentation of sudden onset dizziness, nystagmus and ataxia is most concerning for a posterior stroke. However, this could also be BPPV or less likely a viral infection. These are considered less likely due to history of present illness and physical exam findings.   This is most consistent with an acute complicated illness  Initial Plan:  Code Stroke- CT head, CTA  Screening labs including CBC and Metabolic panel to evaluate for infectious or metabolic etiology of disease.  Coagulation studies  EKG to evaluate for cardiac pathology. Troponins Objective evaluation as below reviewed with plan for close reassessment  Initial Study Results:   Laboratory  All laboratory results reviewed without evidence of clinically relevant pathology.    CT head - IMPRESSION: 1. No evidence of acute intracranial abnormality. 2. ASPECTS is 10.  CTA- No stroke    EKG EKG was reviewed independently. Rate, rhythm, axis, intervals all examined and without medically relevant abnormality. ST segments without concerns for elevations.    Radiology  All images reviewed independently. ***Agree with radiology report at this time.   CT HEAD CODE STROKE WO CONTRAST  Result Date: 11/28/2022 CLINICAL DATA:  Code stroke.  Neuro deficit, acute, stroke suspected EXAM: CT HEAD WITHOUT CONTRAST TECHNIQUE: Contiguous axial images were obtained from the base of the skull through the vertex without intravenous contrast. RADIATION DOSE REDUCTION: This exam was performed according to  the departmental dose-optimization program which includes automated exposure control, adjustment of the mA and/or kV according to patient size and/or use of iterative reconstruction technique. COMPARISON:  None Available. FINDINGS: Brain: No evidence of acute infarction, hemorrhage, hydrocephalus, extra-axial collection or mass lesion/mass effect. Vascular: No hyperdense vessel identified. Skull: No acute fracture. Sinuses/Orbits: No acute finding. Other: None. ASPECTS [facility] Stroke Program Early CT Score) Total score (0-10 with 10 being normal): 10. IMPRESSION: 1. No evidence of acute intracranial abnormality. 2. ASPECTS is 10. Code stroke imaging results were communicated on 11/28/2022 at 5:53 pm to provider [facility] via secure text paging. Electronically Signed   By: Feliberto Harts M.D.   On: 11/28/2022 17:54     Consults:  Case discussed with ***.   Reassessment and Plan:   ***   ***  Clinical Impression: No diagnosis found.   Data Unavailable   Final Clinical Impression(s) / ED Diagnoses Final diagnoses:  None    Rx / DC Orders ED Discharge Orders     None

## 2022-11-28 NOTE — ED Triage Notes (Signed)
Patient BIB [facility] from work for sudden onset emesis and dizziness starting about 1520 today. On arrival to ED patient reports onset of chest heaviness. Patient appears diaphoretic and unwell. BP 190/84, VSS, 4/10 chest heaviness at this time.

## 2022-11-29 ENCOUNTER — Observation Stay (HOSPITAL_BASED_OUTPATIENT_CLINIC_OR_DEPARTMENT_OTHER): Payer: 59

## 2022-11-29 ENCOUNTER — Other Ambulatory Visit (HOSPITAL_COMMUNITY): Payer: 59

## 2022-11-29 ENCOUNTER — Observation Stay (HOSPITAL_COMMUNITY): Payer: 59

## 2022-11-29 DIAGNOSIS — R42 Dizziness and giddiness: Secondary | ICD-10-CM | POA: Diagnosis not present

## 2022-11-29 DIAGNOSIS — Z86711 Personal history of pulmonary embolism: Secondary | ICD-10-CM | POA: Diagnosis not present

## 2022-11-29 DIAGNOSIS — R079 Chest pain, unspecified: Secondary | ICD-10-CM

## 2022-11-29 LAB — CBC
HCT: 40.5 % (ref 39.0–52.0)
Hemoglobin: 11.8 g/dL — ABNORMAL LOW (ref 13.0–17.0)
MCH: 23 pg — ABNORMAL LOW (ref 26.0–34.0)
MCHC: 29.1 g/dL — ABNORMAL LOW (ref 30.0–36.0)
MCV: 78.9 fL — ABNORMAL LOW (ref 80.0–100.0)
Platelets: 360 10*3/uL (ref 150–400)
RBC: 5.13 MIL/uL (ref 4.22–5.81)
RDW: 15.2 % (ref 11.5–15.5)
WBC: 13.6 10*3/uL — ABNORMAL HIGH (ref 4.0–10.5)
nRBC: 0 % (ref 0.0–0.2)

## 2022-11-29 LAB — ECHOCARDIOGRAM COMPLETE
Area-P 1/2: 4.17 cm2
Height: 71 in
MV M vel: 0.7 m/s
MV Peak grad: 1.9 mmHg
S' Lateral: 3.6 cm
Weight: 5664.94 oz

## 2022-11-29 LAB — COMPREHENSIVE METABOLIC PANEL
ALT: 16 U/L (ref 0–44)
AST: 27 U/L (ref 15–41)
Albumin: 3 g/dL — ABNORMAL LOW (ref 3.5–5.0)
Alkaline Phosphatase: 64 U/L (ref 38–126)
Anion gap: 10 (ref 5–15)
BUN: 10 mg/dL (ref 6–20)
CO2: 26 mmol/L (ref 22–32)
Calcium: 8.7 mg/dL — ABNORMAL LOW (ref 8.9–10.3)
Chloride: 100 mmol/L (ref 98–111)
Creatinine, Ser: 1.03 mg/dL (ref 0.61–1.24)
GFR, Estimated: >60 mL/min (ref >60)
Glucose, Bld: 158 mg/dL — ABNORMAL HIGH (ref 70–99)
Potassium: 4.3 mmol/L (ref 3.5–5.1)
Sodium: 136 mmol/L (ref 135–145)
Total Bilirubin: 1 mg/dL (ref 0.3–1.2)
Total Protein: 7.5 g/dL (ref 6.5–8.1)

## 2022-11-29 LAB — LIPID PANEL
Cholesterol: 86 mg/dL (ref 0–200)
HDL: 31 mg/dL — ABNORMAL LOW (ref >40)
LDL Cholesterol: 48 mg/dL (ref 0–99)
Total CHOL/HDL Ratio: 2.8 RATIO
Triglycerides: 37 mg/dL (ref <150)
VLDL: 7 mg/dL (ref 0–40)

## 2022-11-29 LAB — GLUCOSE, CAPILLARY
Glucose-Capillary: 152 mg/dL — ABNORMAL HIGH (ref 70–99)
Glucose-Capillary: 136 mg/dL — ABNORMAL HIGH (ref 70–99)
Glucose-Capillary: 159 mg/dL — ABNORMAL HIGH (ref 70–99)
Glucose-Capillary: 161 mg/dL — ABNORMAL HIGH (ref 70–99)

## 2022-11-29 LAB — HEMOGLOBIN A1C
Hgb A1c MFr Bld: 9.7 % — ABNORMAL HIGH (ref 4.8–5.6)
Mean Plasma Glucose: 231.69 mg/dL

## 2022-11-29 MED ORDER — PERFLUTREN LIPID MICROSPHERE
1.0000 mL | INTRAVENOUS | Status: AC | PRN
  Administered 2022-11-29: 6 mL via INTRAVENOUS

## 2022-11-29 NOTE — Progress Notes (Addendum)
STROKE TEAM PROGRESS NOTE   BRIEF HPI [name] is a 57 y.o. male with history of T2DM, DVT and PE on Eliquis, OSA, BMI 49 presented with acute onset ataxia/dizziness/nausea/vomiting, CT head/CTA negative.  Symptoms concerning for posterior circulation CVA versus inner ear etiology of.  Unable to perform MRI.  SIGNIFICANT HOSPITAL EVENTS 9/24: Admission.  CT head and CTA negative.  INTERIM HISTORY/SUBJECTIVE  On interview, patient says that "everything is moving" as he sits in his bed.  Nonfocal neurological exam while sitting down.  Stroke team observed as PT assessed the patient's gait he was noted to use wide stance, unsteady.  Per PT note, did not tolerate Dix-Hallpike testing we will get a repeat head CT at 24 hours (1800 tonight, 9/24) as patient habitus is not compatible with MRI.  Concern for history of DVT in the setting of new stroke symptoms -- bilateral DVT ultrasound negative.  Despite no pertinent imaging findings, will likely need to continue Eliquis in the setting of right hip joint replacement/total hip arthroplasty.  OBJECTIVE  CBC    Component Value Date/Time   WBC 13.6 (H) 11/29/2022 0255   RBC 5.13 11/29/2022 0255   HGB 11.8 (L) 11/29/2022 0255   HCT 40.5 11/29/2022 0255   PLT 360 11/29/2022 0255   MCV 78.9 (L) 11/29/2022 0255   MCH 23.0 (L) 11/29/2022 0255   MCHC 29.1 (L) 11/29/2022 0255   RDW 15.2 11/29/2022 0255   LYMPHSABS 1.7 11/28/2022 1849   MONOABS 0.8 11/28/2022 1849   EOSABS 0.0 11/28/2022 1849   BASOSABS 0.0 11/28/2022 1849    BMET    Component Value Date/Time   NA 136 11/29/2022 0255   K 4.3 11/29/2022 0255   CL 100 11/29/2022 0255   CO2 26 11/29/2022 0255   GLUCOSE 158 (H) 11/29/2022 0255   BUN 10 11/29/2022 0255   CREATININE 1.03 11/29/2022 0255   CALCIUM 8.7 (L) 11/29/2022 0255   GFRNONAA >60 11/29/2022 0255    IMAGING past 24 hours VAS [place] LOWER EXTREMITY VENOUS (DVT)  Result Date: 11/29/2022  Lower Venous DVT Study Patient  Name:  Chad Rivas  Date of Exam:   11/29/2022 Medical Rec #: [id number]       Accession #:    [id number] Date of Birth: [birth year]        Patient Gender: M Patient Age:   27 years Exam Location:  [facility] Procedure:      VAS [place] LOWER EXTREMITY VENOUS (DVT) Referring Phys: Jamelle Rushing --------------------------------------------------------------------------------  Indications: Pulmonary embolism.  Risk Factors: History of PE DVT History obesity. Anticoagulation: Eliquis. Comparison Study: No residual or novel thrombi seen since previous exam 08/11/20 Performing Technologist: Shona Simpson  Examination Guidelines: A complete evaluation includes B-mode imaging, spectral Doppler, color Doppler, and power Doppler as needed of all accessible portions of each vessel. Bilateral testing is considered an integral part of a complete examination. Limited examinations for reoccurring indications may be performed as noted. The reflux portion of the exam is performed with the patient in reverse Trendelenburg.  +---------+---------------+---------+-----------+----------+--------------+ RIGHT    CompressibilityPhasicitySpontaneityPropertiesThrombus Aging +---------+---------------+---------+-----------+----------+--------------+ CFV      Full           Yes      Yes                                 +---------+---------------+---------+-----------+----------+--------------+ SFJ      Full                                                        +---------+---------------+---------+-----------+----------+--------------+  FV Prox  Full                                                        +---------+---------------+---------+-----------+----------+--------------+ FV Mid   Full                                                        +---------+---------------+---------+-----------+----------+--------------+ FV DistalFull                                                         +---------+---------------+---------+-----------+----------+--------------+ PFV      Full                                                        +---------+---------------+---------+-----------+----------+--------------+ POP      Full           Yes      Yes                                 +---------+---------------+---------+-----------+----------+--------------+ PTV      Full                                                        +---------+---------------+---------+-----------+----------+--------------+ PERO     Full                                                        +---------+---------------+---------+-----------+----------+--------------+   +---------+---------------+---------+-----------+----------+--------------+ LEFT     CompressibilityPhasicitySpontaneityPropertiesThrombus Aging +---------+---------------+---------+-----------+----------+--------------+ CFV      Full           Yes      Yes                                 +---------+---------------+---------+-----------+----------+--------------+ SFJ      Full                                                        +---------+---------------+---------+-----------+----------+--------------+ FV Prox  Full                                                        +---------+---------------+---------+-----------+----------+--------------+  FV Mid   Full                                                        +---------+---------------+---------+-----------+----------+--------------+ FV DistalFull                                                        +---------+---------------+---------+-----------+----------+--------------+ PFV      Full                                                        +---------+---------------+---------+-----------+----------+--------------+ POP      Full           Yes      Yes                                  +---------+---------------+---------+-----------+----------+--------------+ PTV      Full                                                        +---------+---------------+---------+-----------+----------+--------------+ PERO     Full                                                        +---------+---------------+---------+-----------+----------+--------------+    Summary: BILATERAL: - No evidence of deep vein thrombosis seen in the lower extremities, bilaterally. -No evidence of popliteal cyst, bilaterally.   *See table(s) above for measurements and observations.    Preliminary    DG Chest Port 1 View  Result Date: 11/28/2022 CLINICAL DATA:  Chest pain EXAM: PORTABLE CHEST 1 VIEW COMPARISON:  08/10/2020 FINDINGS: The heart size and mediastinal contours are within normal limits. Both lungs are clear. The visualized skeletal structures are unremarkable. IMPRESSION: No active disease. Electronically Signed   By: Helyn Numbers M.D.   On: 11/28/2022 20:29   CT Angio Chest/Abd/Pel for Dissection W and/or W/WO  Result Date: 11/28/2022 CLINICAL DATA:  Dizziness, chest pain, vomiting, left leg weakness EXAM: CT ANGIOGRAPHY CHEST, ABDOMEN AND PELVIS TECHNIQUE: Non-contrast CT of the chest was initially obtained. Multidetector CT imaging through the chest, abdomen and pelvis was performed using the standard protocol during bolus administration of intravenous contrast. Multiplanar reconstructed images and MIPs were obtained and reviewed to evaluate the vascular anatomy. RADIATION DOSE REDUCTION: This exam was performed according to the departmental dose-optimization program which includes automated exposure control, adjustment of the mA and/or kV according to patient size and/or use of iterative reconstruction technique. CONTRAST:  OMNIPAQUE IOHEXOL 350 MG/ML SOLN COMPARISON:  None Available. FINDINGS: CTA CHEST FINDINGS Cardiovascular: There is preferential opacification of the thoracic aorta.  No intramural hematoma, dissection, or aneurysm. Mild atherosclerotic calcification within the proximal descending thoracic aorta. No significant coronary artery calcification. Global cardiac size within normal limits. No pericardial effusion. Central pulmonary arteries are of normal caliber. Poor opacification the pulmonary arterial tree, as this examination was designed for assessment of the thoracic aorta, precludes evaluation for pulmonary embolism. Mediastinum/Nodes: No enlarged mediastinal, hilar, or axillary lymph nodes. Thyroid gland, trachea, and esophagus demonstrate no significant findings. Lungs/Pleura: Lungs are clear. No pleural effusion or pneumothorax. Musculoskeletal: No chest wall abnormality. No acute or significant osseous findings. Review of the MIP images confirms the above findings. CTA ABDOMEN AND PELVIS FINDINGS VASCULAR Aorta: Normal caliber aorta without aneurysm, dissection, vasculitis or significant stenosis. Celiac: Patent without evidence of aneurysm, dissection, vasculitis or significant stenosis. SMA: Patent without evidence of aneurysm, dissection, vasculitis or significant stenosis. Renals: Both renal arteries are patent without evidence of aneurysm, dissection, vasculitis, fibromuscular dysplasia or significant stenosis. IMA: Patent without evidence of aneurysm, dissection, vasculitis or significant stenosis. Inflow: Patent without evidence of aneurysm, dissection, vasculitis or significant stenosis. Veins: No obvious venous abnormality within the limitations of this arterial phase study. Review of the MIP images confirms the above findings. NON-VASCULAR Hepatobiliary: Evaluation is limited by the patient's body habitus and phase of contrast enhancement. Allowing for this, the liver and gallbladder are unremarkable. No intra or extrahepatic biliary ductal dilation. Pancreas: Unremarkable Spleen: Unremarkable Adrenals/Urinary Tract: The adrenal glands are unremarkable. The kidneys  are normal. Bladder is partially obscured by streak artifact, however, the visualized portion is unremarkable. Stomach/Bowel: Stomach is within normal limits. Appendix appears normal. No evidence of bowel wall thickening, distention, or inflammatory changes. No free intraperitoneal gas or fluid. Lymphatic: No pathologic adenopathy within the abdomen and pelvis. Reproductive: Obscured by streak artifact Other: Tiny fat containing umbilical hernia. Musculoskeletal: Surgical changes of right total hip arthroplasty are identified. Osseous structures are otherwise age-appropriate. No acute bone abnormality. Review of the MIP images confirms the above findings. IMPRESSION: 1. Normal examination of the thoracoabdominal aorta. No evidence of aortic dissection or aneurysm. 2. No acute intrathoracic or intra-abdominal pathology identified. Aortic Atherosclerosis (ICD10-I70.0). Electronically Signed   By: Helyn Numbers M.D.   On: 11/28/2022 20:29   CT ANGIO HEAD NECK W WO CM (CODE STROKE)  Result Date: 11/28/2022 CLINICAL DATA:  Stroke, follow up EXAM: CT ANGIOGRAPHY HEAD AND NECK WITH AND WITHOUT CONTRAST TECHNIQUE: Multidetector CT imaging of the head and neck was performed using the standard protocol during bolus administration of intravenous contrast. Multiplanar CT image reconstructions and MIPs were obtained to evaluate the vascular anatomy. Carotid stenosis measurements (when applicable) are obtained utilizing NASCET criteria, using the distal internal carotid diameter as the denominator. RADIATION DOSE REDUCTION: This exam was performed according to the departmental dose-optimization program which includes automated exposure control, adjustment of the mA and/or kV according to patient size and/or use of iterative reconstruction technique. CONTRAST:  OMNIPAQUE IOHEXOL 350 MG/ML SOLN COMPARISON:  Same day CT head. FINDINGS: CTA NECK FINDINGS Aortic arch: Great vessel origins are patent without significant  stenosis. Right carotid system: No evidence of dissection, stenosis (50% or greater), or occlusion. Left carotid system: No evidence of dissection, stenosis (50% or greater), or occlusion. Vertebral arteries: Left dominant. Both vertebral arteries are patent without greater than 50% stenosis. Evaluation is limited by streak artifact through the vertebral arteries. Skeleton: Negative. Other neck: Negative. Upper chest: Visualized lung apices are clear. Review of the MIP images confirms the above findings CTA HEAD FINDINGS Anterior  circulation: Bilateral intracranial ICAs, MCAs, and ACAs are patent without proximal hemodynamically significant stenosis. Posterior circulation: Bilateral intradural vertebral arteries, basilar artery and bilateral posterior cerebral arteries are patent without proximal hemodynamically significant stenosis. Right intradural vertebral artery is non dominant and tortuous proximally. Venous sinuses: As permitted by contrast timing, patent. Review of the MIP images confirms the above findings IMPRESSION: No emergent large vessel occlusion or proximal hemodynamically significant stenosis. Findings discussed with Dr. Selina Cooley via telephone at 06:25 p.m. Electronically Signed   By: Feliberto Harts M.D.   On: 11/28/2022 18:37   CT HEAD CODE STROKE WO CONTRAST  Result Date: 11/28/2022 CLINICAL DATA:  Code stroke.  Neuro deficit, acute, stroke suspected EXAM: CT HEAD WITHOUT CONTRAST TECHNIQUE: Contiguous axial images were obtained from the base of the skull through the vertex without intravenous contrast. RADIATION DOSE REDUCTION: This exam was performed according to the departmental dose-optimization program which includes automated exposure control, adjustment of the mA and/or kV according to patient size and/or use of iterative reconstruction technique. COMPARISON:  None Available. FINDINGS: Brain: No evidence of acute infarction, hemorrhage, hydrocephalus, extra-axial collection or mass  lesion/mass effect. Vascular: No hyperdense vessel identified. Skull: No acute fracture. Sinuses/Orbits: No acute finding. Other: None. ASPECTS [facility] Stroke Program Early CT Score) Total score (0-10 with 10 being normal): 10. IMPRESSION: 1. No evidence of acute intracranial abnormality. 2. ASPECTS is 10. Code stroke imaging results were communicated on 11/28/2022 at 5:53 pm to provider [facility] via secure text paging. Electronically Signed   By: Feliberto Harts M.D.   On: 11/28/2022 17:54    Vitals:   11/29/22 0530 11/29/22 0617 11/29/22 0740 11/29/22 1145  BP: 124/75 133/87 133/69 129/81  Pulse: 91 87 86 78  Resp: 19 20 19 17   Temp:  98.3 F (36.8 C) 97.8 F (36.6 C) 97.9 F (36.6 C)  TempSrc:   Oral Oral  SpO2: 100% 94% 96% 93%  Weight:      Height:         PHYSICAL EXAM General:  Alert, morbidly obese middle-aged African-American male patient in no acute distress Psych:  Mood and affect appropriate for situation CV: Regular rate and rhythm on monitor Respiratory:  Regular, unlabored respirations on room air GI: Abdomen soft and nontender   NEURO:  Mental Status: AA&Ox3, patient is able to give clear and coherent history Speech/Language: speech is without dysarthria or aphasia.  Naming, repetition, fluency, and comprehension intact.  Cranial Nerves:  II: PERRL. Visual fields full.  III, IV, VI: EOMI. Eyelids elevate symmetrically.  Left nystagmus. V: Sensation is intact to light touch and symmetrical to face.  VII: Face is symmetrical resting and smiling VIII: hearing intact to voice. IX, X: Palate elevates symmetrically. Phonation is normal.  LK:[id number] shrug 5/5. XII: tongue is midline without fasciculations. Motor: 5/5 strength to all muscle groups tested.  Tone: is normal and bulk is normal Sensation- Intact to light touch bilaterally. Extinction absent to light touch to DSS.   Coordination: FTN intact bilaterally Gait: With PT in room, patient gait broad-based,  slow, unsteady on feet.  PT exam aborted.  Overall: Nonfocal, continued dizziness at rest and ataxia on ambulation   ASSESSMENT/PLAN  Suspicion for small ischemic stroke of posterior circulation versus peripheral vertigo Patient unable to fit into MRI scan Etiology: Workup ongoing.  We still believe that brainstem vs cerebellar stroke likeliest etiology, as patient remains dizzy/nauseous while sitting up and holding still.  MRI would be needed to confirm, however patient body habitus  excludes him from receiving an MRI.  PT will plan for formal vestibular eval tomorrow 9/25.  If 24-hour repeat head CT is unchanged, will continue home Eliquis 5 twice daily in the setting of total hip arthroplasty and negative VAS [place] lower extremity.  Code Stroke CT head: No acute abnormality. ASPECTS 10.  CTA head & neck: No emergent LVO or significant stenosis. MRI: Unable to perform Lower extremity venous ultrasound: WNL 2D Echo: Awaiting read LDL 48 HgbA1c 9.7 VTE prophylaxis -none, we will restart home Eliquis 5 twice daily Eliquis (apixaban) daily prior to admission, now on no anticoagulation Therapy recommendations: Patient continues to have severe vertigo/nausea/vomiting on movement.  Awaiting PT vestibular exam recs. Disposition: To be determined  DVT and PE (01/2022) Home Meds:  Continue telemetry monitoring Begin anticoagulation with home Eliquis 5 mg twice daily if repeat CT @1800  9/24 shows no change  HFmrEF Home meds: Coreg 3.125 twice daily, held now Awaiting ECG as above  Hyperlipidemia Home meds: Lipitor 40, resumed in hospital LDL 48, goal < 70 Continue statin at discharge  Diabetes type II Uncontrolled Home meds: 60 units glargine nightly, prandial Humalog 3 times daily HgbA1c 9.7, goal < 7.0 CBGs, (136-215) Sensitive SSI with HS correction Recommend close follow-up with PCP for better DM control  Other Stroke Risk Factors Morbid obesity, Body mass index is 49.38 kg/m.,  BMI >/= 30 associated with increased stroke risk, recommend weight loss, diet and exercise as appropriate  Coronary artery disease Congestive heart failure  Hospital day # 0  I have personally obtained history,examined this patient, reviewed notes, independently viewed imaging studies, participated in medical decision making and plan of care.ROS completed by me personally and pertinent positives fully documented  I have made any additions or clarifications directly to the above note. Agree with note above.  Patient presented with sudden onset of dizziness, unsteady gait likely due to suspected small posterior circulation infarct.  CT head was negative.  Patient unable to tolerate MRI due to body habitus and hence we will repeat CT scan in 24 hours.  Continue Eliquis for his DVT lifelong due to history of multiple DVTs.  Aggressive risk factor modification.  Continue ongoing therapies.  Long discussion with patient and answered questions.  Greater than 50% time during this 50-minute visit was spent in counseling and coordination of care discussion patient care team and answering questions.  Delia Heady, MD Medical Director [facility] Stroke Center Pager: [phone] 11/29/2022 5:30 PM   To contact Stroke Continuity provider, please refer to [email or web address]. After hours, contact General Neurology

## 2022-11-29 NOTE — Plan of Care (Signed)
  Problem: Fluid Volume: Goal: Hemodynamic stability will improve Outcome: Progressing   Problem: Clinical Measurements: Goal: Diagnostic test results will improve Outcome: Progressing Goal: Signs and symptoms of infection will decrease Outcome: Progressing   Problem: Respiratory: Goal: Ability to maintain adequate ventilation will improve Outcome: Progressing   Problem: Education: Goal: Ability to describe self-care measures that may prevent or decrease complications (Diabetes Survival Skills Education) will improve Outcome: Progressing Goal: Individualized Educational Video(s) Outcome: Progressing   Problem: Coping: Goal: Ability to adjust to condition or change in health will improve Outcome: Progressing   Problem: Fluid Volume: Goal: Ability to maintain a balanced intake and output will improve Outcome: Progressing   Problem: Health Behavior/Discharge Planning: Goal: Ability to identify and utilize available resources and services will improve Outcome: Progressing Goal: Ability to manage health-related needs will improve Outcome: Progressing   Problem: Metabolic: Goal: Ability to maintain appropriate glucose levels will improve Outcome: Progressing   Problem: Nutritional: Goal: Maintenance of adequate nutrition will improve Outcome: Progressing Goal: Progress toward achieving an optimal weight will improve Outcome: Progressing   Problem: Skin Integrity: Goal: Risk for impaired skin integrity will decrease Outcome: Progressing   Problem: Tissue Perfusion: Goal: Adequacy of tissue perfusion will improve Outcome: Progressing   Problem: Education: Goal: Knowledge of disease or condition will improve Outcome: Progressing Goal: Knowledge of secondary prevention will improve (MUST DOCUMENT ALL) Outcome: Progressing Goal: Knowledge of patient specific risk factors will improve Loraine Leriche N/A or DELETE if not current risk factor) Outcome: Progressing   Problem:  Ischemic Stroke/TIA Tissue Perfusion: Goal: Complications of ischemic stroke/TIA will be minimized Outcome: Progressing   Problem: Coping: Goal: Will verbalize positive feelings about self Outcome: Progressing Goal: Will identify appropriate support needs Outcome: Progressing   Problem: Health Behavior/Discharge Planning: Goal: Ability to manage health-related needs will improve Outcome: Progressing Goal: Goals will be collaboratively established with patient/family Outcome: Progressing   Problem: Self-Care: Goal: Ability to participate in self-care as condition permits will improve Outcome: Progressing Goal: Verbalization of feelings and concerns over difficulty with self-care will improve Outcome: Progressing Goal: Ability to communicate needs accurately will improve Outcome: Progressing   Problem: Nutrition: Goal: Risk of aspiration will decrease Outcome: Progressing Goal: Dietary intake will improve Outcome: Progressing   Problem: Education: Goal: Knowledge of General Education information will improve Description: Including pain rating scale, medication(s)/side effects and non-pharmacologic comfort measures Outcome: Progressing   Problem: Health Behavior/Discharge Planning: Goal: Ability to manage health-related needs will improve Outcome: Progressing   Problem: Clinical Measurements: Goal: Ability to maintain clinical measurements within normal limits will improve Outcome: Progressing Goal: Will remain free from infection Outcome: Progressing Goal: Diagnostic test results will improve Outcome: Progressing Goal: Respiratory complications will improve Outcome: Progressing Goal: Cardiovascular complication will be avoided Outcome: Progressing   Problem: Activity: Goal: Risk for activity intolerance will decrease Outcome: Progressing   Problem: Nutrition: Goal: Adequate nutrition will be maintained Outcome: Progressing   Problem: Coping: Goal: Level of  anxiety will decrease Outcome: Progressing   Problem: Elimination: Goal: Will not experience complications related to bowel motility Outcome: Progressing Goal: Will not experience complications related to urinary retention Outcome: Progressing   Problem: Pain Managment: Goal: General experience of comfort will improve Outcome: Progressing   Problem: Safety: Goal: Ability to remain free from injury will improve Outcome: Progressing   Problem: Skin Integrity: Goal: Risk for impaired skin integrity will decrease Outcome: Progressing

## 2022-11-29 NOTE — ED Provider Notes (Signed)
Ultrasound ED Peripheral IV (Provider)  Date/Time: 11/29/2022 12:35 AM  Performed by: Glyn Ade, MD Authorized by: Glyn Ade, MD   Procedure details:    Indications: multiple failed IV attempts     Skin Prep: chlorhexidine gluconate     Angiocath:  18 G   Bedside Ultrasound Guided: Yes     Images: archived     Patient tolerated procedure without complications: Yes     Dressing applied: Yes       Glyn Ade, MD 11/29/22 [id number]

## 2022-11-29 NOTE — TOC Initial Note (Signed)
Transition of Care [facility]) - Initial/Assessment Note    Patient Details  Name: Chad Rivas MRN: [id number] Date of Birth: 01-07-1966  [facility]) CM/SW Contact:    Kermit Balo, RN Phone Number: 11/29/2022, 12:49 PM  Clinical Narrative:                  Pt is from home with his spouse that is retired and can assist at home. No DME at home. Pt drives self but spouse can provide transportation if needed.  Patient manages his own medications and denies any issues.  Awaiting therapy evals.  TOC following.  Expected Discharge Plan: Home/Self Care Barriers to Discharge: Continued Medical Work up   Patient Goals and CMS Choice            Expected Discharge Plan and Services   Discharge Planning Services: CM Consult   Living arrangements for the past 2 months: Single Family Home                                      Prior Living Arrangements/Services Living arrangements for the past 2 months: Single Family Home Lives with:: Spouse Patient language and need for interpreter reviewed:: Yes Do you feel safe going back to the place where you live?: Yes        Care giver support system in place?: Yes (comment)   Criminal Activity/Legal Involvement Pertinent to Current Situation/Hospitalization: No - Comment as needed  Activities of Daily Living Home Assistive Devices/Equipment: CBG Meter, Eyeglasses ADL Screening (condition at time of admission) Does the patient have a NEW difficulty with bathing/dressing/toileting/self-feeding that is expected to last >3 days?: No Does the patient have a NEW difficulty with getting in/out of bed, walking, or climbing stairs that is expected to last >3 days?: No Does the patient have a NEW difficulty with communication that is expected to last >3 days?: No Is the patient deaf or have difficulty hearing?: No Does the patient have difficulty seeing, even when wearing glasses/contacts?: No Does the patient have  difficulty concentrating, remembering, or making decisions?: No  Permission Sought/Granted                  Emotional Assessment Appearance:: Appears stated age Attitude/Demeanor/Rapport: Engaged Affect (typically observed): Accepting Orientation: : Oriented to Self, Oriented to Place, Oriented to  Time, Oriented to Situation   Psych Involvement: No (comment)  Admission diagnosis:  Vertigo [R42] Cerebrovascular accident (CVA), unspecified mechanism (HCC) [I63.9] Patient Active Problem List   Diagnosis Date Noted   Vertigo 11/28/2022   Heart failure with mildly reduced ejection fraction (HFmrEF) (HCC) 11/28/2022   Iron deficiency anemia, unspecified 09/20/2022   Dysphagia 09/20/2022   History of pulmonary embolism 01/15/2022   SIRS (systemic inflammatory response syndrome) (HCC) 01/15/2022   OSA (obstructive sleep apnea) 01/15/2022   Microcytic anemia 01/15/2022   Abscess of right groin 07/13/2017   DM2 (diabetes mellitus, type 2) (HCC) 07/13/2017   Morbid obesity (HCC) 07/13/2017   PCP:  Emilio Aspen, MD Pharmacy:   [facility] [address] [place] - [address] [place] [address] Phone: [phone] Fax: [phone]  [facility] [address] [place] [address] Phone: [phone] Fax: [phone]     Social Determinants of Health (SDOH) Social History: SDOH Screenings   Food Insecurity: No Food Insecurity (11/29/2022)  Housing: Low Risk  (11/29/2022)  Transportation Needs: No Transportation Needs (11/29/2022)  Utilities: Not At Risk (11/29/2022)  Tobacco Use: Low Risk  (11/28/2022)   SDOH Interventions:     Readmission Risk Interventions     No data to display

## 2022-11-29 NOTE — Evaluation (Signed)
Physical Therapy Evaluation Patient Details Name: CARON THAN MRN: [id number] DOB: 1966-02-22 Today's Date: 11/29/2022  History of Present Illness  Pt is a 57 y/o M admitted on 11/28/22 after presenting with c/o acute onset of dizziness, N&V. Head CT was negative for acute issues, pt unable to receive MRI. PMH: IDDM, DVT & PE on eliquis, OSA, chronic HFmrEF, sleep apnea  Clinical Impression  Pt seen for PT evaluation with pt agreeable to tx. Pt reports prior to admission he was independent without AD, driving, working. Pt notes acute onset of dizziness that is lingering, noting he's dizzy even lying in bed during evaluation. Pt is able to complete bed mobility with mod I but requires +2 HHA for STS EOB & to take ~2 steps along EOB. Pt feels as though he's losing balance to L. PT observes L beating nystagmus & attempts Dix-Hallpike testing with bed in trendelenburg position but pt upon lying in that position pt becomes nauseous & vomits. Pt left sitting upright in bed. Will plan for formal vestibular evaluation during next session.        If plan is discharge home, recommend the following: A lot of help with bathing/dressing/bathroom;Two people to help with walking and/or transfers   Can travel by private vehicle        Equipment Recommendations None recommended by PT  Recommendations for Other Services       Functional Status Assessment Patient has had a recent decline in their functional status and demonstrates the ability to make significant improvements in function in a reasonable and predictable amount of time.     Precautions / Restrictions Precautions Precautions: Fall Restrictions Weight Bearing Restrictions: No      Mobility  Bed Mobility Overal bed mobility: Needs Assistance Bed Mobility: Supine to Sit, Sit to Supine     Supine to sit: Modified independent (Device/Increase time), Used rails, HOB elevated Sit to supine: Modified independent (Device/Increase time), Used  rails        Transfers Overall transfer level: Needs assistance Equipment used: 2 person hand held assist Transfers: Sit to/from Stand Sit to Stand: Min assist, Mod assist                Ambulation/Gait Ambulation/Gait assistance:  (2 side steps at EOB with mod assist with HHA, wide BOS, pt feeling like he's falling L)                Stairs            Wheelchair Mobility     Tilt Bed    Modified Rankin (Stroke Patients Only)       Balance Overall balance assessment: Needs assistance Sitting-balance support: Feet supported Sitting balance-Leahy Scale: Fair     Standing balance support: No upper extremity supported, During functional activity, Bilateral upper extremity supported Standing balance-Leahy Scale: Poor                               Pertinent Vitals/Pain Pain Assessment Pain Assessment: No/denies pain    Home Living Family/patient expects to be discharged to:: Private residence Living Arrangements: Spouse/significant other;Children Available Help at Discharge: Family Type of Home: House Home Access: Stairs to enter Entrance Stairs-Rails: Left Entrance Stairs-Number of Steps: 3-4 with L rail   Home Layout: One level        Prior Function Prior Level of Function : Independent/Modified Independent;Working/employed;Driving  Mobility Comments: denies falls in the past 6 months       Extremity/Trunk Assessment   Upper Extremity Assessment Upper Extremity Assessment: Overall WFL for tasks assessed    Lower Extremity Assessment Lower Extremity Assessment: Overall WFL for tasks assessed       Communication      Cognition Arousal: Alert Behavior During Therapy: WFL for tasks assessed/performed Overall Cognitive Status: Within Functional Limits for tasks assessed                                          General Comments      Exercises     Assessment/Plan    PT Assessment  Patient needs continued PT services  PT Problem List Decreased activity tolerance;Decreased balance;Decreased mobility       PT Treatment Interventions Balance training;DME instruction;Gait training;Stair training;Functional mobility training;Therapeutic activities;Therapeutic exercise;Manual techniques;Neuromuscular re-education;Modalities;Canalith reposition    PT Goals (Current goals can be found in the Care Plan section)  Acute Rehab PT Goals Patient Stated Goal: decreased dizziness, feel better PT Goal Formulation: With patient Time For Goal Achievement: 12/13/22 Potential to Achieve Goals: Good    Frequency Min 1X/week     Co-evaluation               AM-PAC PT "6 Clicks" Mobility  Outcome Measure Help needed turning from your back to your side while in a flat bed without using bedrails?: None Help needed moving from lying on your back to sitting on the side of a flat bed without using bedrails?: None Help needed moving to and from a bed to a chair (including a wheelchair)?: A Lot Help needed standing up from a chair using your arms (e.g., wheelchair or bedside chair)?: A Lot Help needed to walk in hospital room?: A Lot Help needed climbing 3-5 steps with a railing? : Total 6 Click Score: 15    End of Session   Activity Tolerance:  (limited by N&V)     PT Visit Diagnosis: Unsteadiness on feet (R26.81);Difficulty in walking, not elsewhere classified (R26.2);Dizziness and giddiness (R42)    Time: 4782-9562 PT Time Calculation (min) (ACUTE ONLY): 22 min   Charges:   PT Evaluation $PT Eval Moderate Complexity: 1 Mod   PT General Charges $$ ACUTE PT VISIT: 1 Visit         Aleda Grana, PT, DPT 11/29/22, 12:54 PM   Sandi Mariscal 11/29/2022, 12:52 PM

## 2022-11-29 NOTE — Progress Notes (Signed)
Lower extremity venous duplex completed. Please see CV Procedures for preliminary results.  Shona Simpson, RVT 11/29/22 10:18 AM

## 2022-11-29 NOTE — Progress Notes (Signed)
OT Cancellation Note  Patient Details Name: Chad Rivas MRN: [id number] DOB: 1965-07-10   Cancelled Treatment:    Reason Eval/Treat Not Completed: Other (comment) Pt actively vomiting. Will hold OT eval attempts and follow up as schedule permits.   Lorre Munroe 11/29/2022, 12:55 PM

## 2022-11-29 NOTE — Progress Notes (Signed)
SLP Cancellation Note  Patient Details Name: Chad Rivas MRN: [id number] DOB: 1965-03-22   Cancelled treatment:       Reason Eval/Treat Not Completed: SLP screened, no needs identified, will sign off   Dajuan Turnley, Riley Nearing 11/29/2022, 3:03 PM

## 2022-11-29 NOTE — ED Notes (Signed)
ED TO INPATIENT HANDOFF REPORT  ED Nurse Name and Phone #: Alycia Rossetti [phone]   S Name/Age/Gender Chad Rivas 57 y.o. male Room/Bed: 045C/045C  Code Status   Code Status: Full Code  Home/SNF/Other Home Patient oriented to: self, place, time, and situation Is this baseline? Yes   Triage Complete: Triage complete  Chief Complaint Vertigo [R42]  Triage Note Patient BIB [facility] from work for sudden onset emesis and dizziness starting about 1520 today. On arrival to ED patient reports onset of chest heaviness. Patient appears diaphoretic and unwell. BP 190/84, VSS, 4/10 chest heaviness at this time.   Allergies Allergies  Allergen Reactions   Penicillins Anaphylaxis and Other (See Comments)    Has patient had a PCN reaction causing immediate rash, facial/tongue/throat swelling, SOB or lightheadedness with hypotension: Y Has patient had a PCN reaction causing severe rash involving mucus membranes or skin necrosis: Y Has patient had a PCN reaction that required hospitalization: Y Has patient had a PCN reaction occurring within the last 10 years: N If all of the above answers are "NO", then may proceed with Cephalosporin use.   Pt states he had hives that required Benadryl treatment     Bactrim [Sulfamethoxazole-Trimethoprim] Hives   Invokana [Canagliflozin] Other (See Comments)    Hx of Fournier's gangrene    Penicillin G Procaine Hives   Losartan Other (See Comments)    Cause angioedema    Level of Care/Admitting Diagnosis ED Disposition     ED Disposition  Admit   Condition  --   Comment  Hospital Area: [facility] [[id number]]  Level of Care: Telemetry Medical [104]  May place patient in observation at [facility] or [facility] if equivalent level of care is available:: No  Covid Evaluation: Asymptomatic - no recent exposure (last 10 days) testing not required  Diagnosis: Vertigo [207257]  Admitting Physician: Briscoe Deutscher [[id number]]  Attending  Physician: Briscoe Deutscher [[id number]]          B Medical/Surgery History Past Medical History:  Diagnosis Date   Diabetes mellitus without complication (HCC)    Heart failure with mildly reduced ejection fraction (HFmrEF) (HCC) 11/28/2022   History of pulmonary embolism 01/15/2022   Sleep apnea    pt has CPAP but does not use    Past Surgical History:  Procedure Laterality Date   BIOPSY  09/20/2022   Procedure: BIOPSY;  Surgeon: Charlott Rakes, MD;  Location: [facility];  Service: Gastroenterology;;   COLONOSCOPY WITH PROPOFOL N/A 09/20/2022   Procedure: COLONOSCOPY WITH PROPOFOL;  Surgeon: Charlott Rakes, MD;  Location: [facility];  Service: Gastroenterology;  Laterality: N/A;   ESOPHAGOGASTRODUODENOSCOPY (EGD) WITH PROPOFOL N/A 09/20/2022   Procedure: ESOPHAGOGASTRODUODENOSCOPY (EGD) WITH PROPOFOL;  Surgeon: Charlott Rakes, MD;  Location: [facility];  Service: Gastroenterology;  Laterality: N/A;   IRRIGATION AND DEBRIDEMENT ABSCESS Right 07/13/2017   Procedure: INCISION AND DRAINAGE OF GROIN ABSCESS;  Surgeon: Ihor Gully, MD;  Location: [facility];  Service: Urology;  Laterality: Right;   IRRIGATION AND DEBRIDEMENT ABSCESS N/A 01/15/2022   Procedure: IRRIGATION AND DEBRIDEMENT ABSCESS;  Surgeon: Crista Elliot, MD;  Location: [facility];  Service: Urology;  Laterality: N/A;   JOINT REPLACEMENT  2009   R hip   POLYPECTOMY  09/20/2022   Procedure: POLYPECTOMY;  Surgeon: Charlott Rakes, MD;  Location: [facility];  Service: Gastroenterology;;   TOE SURGERY     TOTAL HIP ARTHROPLASTY       A IV Location/Drains/Wounds Patient Lines/Drains/Airways Status  Active Line/Drains/Airways     Name Placement date Placement time Site Days   Peripheral IV 11/28/22 20 G Anterior;Distal;Right;Upper Arm 11/28/22  1836  Arm  1            Intake/Output Last 24 hours No intake or output data in the 24 hours ending 11/29/22 1610  Labs/Imaging Results for orders placed  or performed during the hospital encounter of 11/28/22 (from the past 48 hour(s))  CBG monitoring, ED     Status: Abnormal   Collection Time: 11/28/22  5:28 PM  Result Value Ref Range   Glucose-Capillary 215 (H) 70 - 99 mg/dL    Comment: Glucose reference range applies only to samples taken after fasting for at least 8 hours.  Basic metabolic panel     Status: Abnormal   Collection Time: 11/28/22  6:49 PM  Result Value Ref Range   Sodium 136 135 - 145 mmol/L   Potassium 3.9 3.5 - 5.1 mmol/L    Comment: HEMOLYSIS AT THIS LEVEL MAY AFFECT RESULT   Chloride 97 (L) 98 - 111 mmol/L   CO2 26 22 - 32 mmol/L   Glucose, Bld 218 (H) 70 - 99 mg/dL    Comment: Glucose reference range applies only to samples taken after fasting for at least 8 hours.   BUN 10 6 - 20 mg/dL   Creatinine, Ser 9.60 0.61 - 1.24 mg/dL   Calcium 8.5 (L) 8.9 - 10.3 mg/dL   GFR, Estimated >45 >40 mL/min    Comment: (NOTE) Calculated using the CKD-EPI Creatinine Equation (2021)    Anion gap 13 5 - 15    Comment: Performed at [facility], [address] [place], [place] [address]  CBC     Status: Abnormal   Collection Time: 11/28/22  6:49 PM  Result Value Ref Range   WBC 14.8 (H) 4.0 - 10.5 K/uL   RBC 5.11 4.22 - 5.81 MIL/uL   Hemoglobin 12.4 (L) 13.0 - 17.0 g/dL   HCT 14.7 82.9 - 56.2 %   MCV 79.8 (L) 80.0 - 100.0 fL   MCH 24.3 (L) 26.0 - 34.0 pg   MCHC 30.4 30.0 - 36.0 g/dL   RDW 13.0 86.5 - 78.4 %   Platelets 344 150 - 400 K/uL   nRBC 0.0 0.0 - 0.2 %    Comment: Performed at [facility], [address], [place], [place] [address]  Troponin I (High Sensitivity)     Status: None   Collection Time: 11/28/22  6:49 PM  Result Value Ref Range   Troponin I (High Sensitivity) 6 <18 ng/L    Comment: (NOTE) Elevated high sensitivity troponin I (hsTnI) values and significant  changes across serial measurements may suggest ACS but many other  chronic and acute conditions are known to elevate hsTnI results.   Refer to the "Links" section for chest pain algorithms and additional  guidance. Performed at [facility], [address], [place], [place] [address]   Ethanol     Status: None   Collection Time: 11/28/22  6:49 PM  Result Value Ref Range   Alcohol, Ethyl (B) <10 <10 mg/dL    Comment: (NOTE) Lowest detectable limit for serum alcohol is 10 mg/dL.  For medical purposes only. Performed at [facility], [address] [place], [place] [address]   Protime-INR     Status: Abnormal   Collection Time: 11/28/22  6:49 PM  Result Value Ref Range   Prothrombin  Time 15.3 (H) 11.4 - 15.2 seconds   INR 1.2 0.8 - 1.2    Comment: (NOTE) INR goal varies based on device and disease states. Performed at [facility], [address], [place], [place] [address]   APTT     Status: None   Collection Time: 11/28/22  6:49 PM  Result Value Ref Range   aPTT 30 24 - 36 seconds    Comment: Performed at [facility], [address], [place], [place] [address]  Differential     Status: Abnormal   Collection Time: 11/28/22  6:49 PM  Result Value Ref Range   Neutrophils Relative % 82 %   Neutro Abs 12.2 (H) 1.7 - 7.7 K/uL   Lymphocytes Relative 12 %   Lymphs Abs 1.7 0.7 - 4.0 K/uL   Monocytes Relative 5 %   Monocytes Absolute 0.8 0.1 - 1.0 K/uL   Eosinophils Relative 0 %   Eosinophils Absolute 0.0 0.0 - 0.5 K/uL   Basophils Relative 0 %   Basophils Absolute 0.0 0.0 - 0.1 K/uL   Immature Granulocytes 1 %   Abs Immature Granulocytes 0.09 (H) 0.00 - 0.07 K/uL    Comment: Performed at [facility], [address] [place], [place] [address]  I-stat chem 8, ED     Status: Abnormal   Collection Time: 11/28/22  7:15 PM  Result Value Ref Range   Sodium 138 135 - 145 mmol/L   Potassium 4.1 3.5 - 5.1 mmol/L   Chloride 99 98 - 111 mmol/L   BUN 14 6 - 20 mg/dL   Creatinine, Ser 6.96 0.61 - 1.24 mg/dL   Glucose, Bld 295 (H) 70 - 99 mg/dL    Comment: Glucose reference range  applies only to samples taken after fasting for at least 8 hours.   Calcium, Ion 1.03 (L) 1.15 - 1.40 mmol/L   TCO2 29 22 - 32 mmol/L   Hemoglobin 14.6 13.0 - 17.0 g/dL   HCT 28.4 13.2 - 44.0 %  CBG monitoring, ED     Status: Abnormal   Collection Time: 11/28/22 11:02 PM  Result Value Ref Range   Glucose-Capillary 182 (H) 70 - 99 mg/dL    Comment: Glucose reference range applies only to samples taken after fasting for at least 8 hours.  Lipid panel     Status: Abnormal   Collection Time: 11/29/22  2:55 AM  Result Value Ref Range   Cholesterol 86 0 - 200 mg/dL   Triglycerides 37 <102 mg/dL   HDL 31 (L) >72 mg/dL   Total CHOL/HDL Ratio 2.8 RATIO   VLDL 7 0 - 40 mg/dL   LDL Cholesterol 48 0 - 99 mg/dL    Comment:        Total Cholesterol/HDL:CHD Risk Coronary Heart Disease Risk Table                     Men   Women  1/2 Average Risk   3.4   3.3  Average Risk       5.0   4.4  2 X Average Risk   9.6   7.1  3 X Average Risk  23.4   11.0        Use the calculated Patient Ratio above and the CHD Risk Table to determine the patient's CHD Risk.        ATP III CLASSIFICATION (LDL):  <100     mg/dL   Optimal  536-644  mg/dL   Near or Above                    Optimal  130-159  mg/dL   Borderline  474-259  mg/dL   High  >563     mg/dL   Very High Performed at [facility], [address], [place], [place] [address]   Hemoglobin A1c     Status: Abnormal   Collection Time: 11/29/22  2:55 AM  Result Value Ref Range   Hgb A1c MFr Bld 9.7 (H) 4.8 - 5.6 %    Comment: (NOTE) Pre diabetes:          5.7%-6.4%  Diabetes:              >6.4%  Glycemic control for   <7.0% adults with diabetes    Mean Plasma Glucose 231.69 mg/dL    Comment: Performed at [facility], [address] [place], [place] [address]  CBC     Status: Abnormal   Collection Time: 11/29/22  2:55 AM  Result Value Ref Range   WBC 13.6 (H) 4.0 - 10.5 K/uL   RBC 5.13 4.22 - 5.81 MIL/uL   Hemoglobin 11.8  (L) 13.0 - 17.0 g/dL   HCT 18.8 41.6 - 60.6 %   MCV 78.9 (L) 80.0 - 100.0 fL   MCH 23.0 (L) 26.0 - 34.0 pg   MCHC 29.1 (L) 30.0 - 36.0 g/dL   RDW 30.1 60.1 - 09.3 %   Platelets 360 150 - 400 K/uL   nRBC 0.0 0.0 - 0.2 %    Comment: Performed at [facility], [address], [place], [place] [address]  Comprehensive metabolic panel     Status: Abnormal   Collection Time: 11/29/22  2:55 AM  Result Value Ref Range   Sodium 136 135 - 145 mmol/L   Potassium 4.3 3.5 - 5.1 mmol/L   Chloride 100 98 - 111 mmol/L   CO2 26 22 - 32 mmol/L   Glucose, Bld 158 (H) 70 - 99 mg/dL    Comment: Glucose reference range applies only to samples taken after fasting for at least 8 hours.   BUN 10 6 - 20 mg/dL   Creatinine, Ser 3.22 0.61 - 1.24 mg/dL   Calcium 8.7 (L) 8.9 - 10.3 mg/dL   Total Protein 7.5 6.5 - 8.1 g/dL   Albumin 3.0 (L) 3.5 - 5.0 g/dL   AST 27 15 - 41 U/L   ALT 16 0 - 44 U/L   Alkaline Phosphatase 64 38 - 126 U/L   Total Bilirubin 1.0 0.3 - 1.2 mg/dL   GFR, Estimated >02 >54 mL/min    Comment: (NOTE) Calculated using the CKD-EPI Creatinine Equation (2021)    Anion gap 10 5 - 15    Comment: Performed at [facility], [address], [place], [place] [address]   DG Chest Port 1 View  Result Date: 11/28/2022 CLINICAL DATA:  Chest pain EXAM: PORTABLE CHEST 1 VIEW COMPARISON:  08/10/2020 FINDINGS: The heart size and mediastinal contours are within normal limits. Both lungs are clear. The visualized skeletal structures are unremarkable. IMPRESSION: No active disease. Electronically Signed   By: Helyn Numbers M.D.   On: 11/28/2022 20:29   CT Angio Chest/Abd/Pel for Dissection W and/or W/WO  Result Date: 11/28/2022 CLINICAL DATA:  Dizziness, chest pain, vomiting, left leg weakness EXAM: CT ANGIOGRAPHY CHEST, ABDOMEN AND PELVIS TECHNIQUE: Non-contrast CT of the chest was initially  obtained. Multidetector CT imaging through the chest, abdomen and pelvis was performed using the  standard protocol during bolus administration of intravenous contrast. Multiplanar reconstructed images and MIPs were obtained and reviewed to evaluate the vascular anatomy. RADIATION DOSE REDUCTION: This exam was performed according to the departmental dose-optimization program which includes automated exposure control, adjustment of the mA and/or kV according to patient size and/or use of iterative reconstruction technique. CONTRAST:  OMNIPAQUE IOHEXOL 350 MG/ML SOLN COMPARISON:  None Available. FINDINGS: CTA CHEST FINDINGS Cardiovascular: There is preferential opacification of the thoracic aorta. No intramural hematoma, dissection, or aneurysm. Mild atherosclerotic calcification within the proximal descending thoracic aorta. No significant coronary artery calcification. Global cardiac size within normal limits. No pericardial effusion. Central pulmonary arteries are of normal caliber. Poor opacification the pulmonary arterial tree, as this examination was designed for assessment of the thoracic aorta, precludes evaluation for pulmonary embolism. Mediastinum/Nodes: No enlarged mediastinal, hilar, or axillary lymph nodes. Thyroid gland, trachea, and esophagus demonstrate no significant findings. Lungs/Pleura: Lungs are clear. No pleural effusion or pneumothorax. Musculoskeletal: No chest wall abnormality. No acute or significant osseous findings. Review of the MIP images confirms the above findings. CTA ABDOMEN AND PELVIS FINDINGS VASCULAR Aorta: Normal caliber aorta without aneurysm, dissection, vasculitis or significant stenosis. Celiac: Patent without evidence of aneurysm, dissection, vasculitis or significant stenosis. SMA: Patent without evidence of aneurysm, dissection, vasculitis or significant stenosis. Renals: Both renal arteries are patent without evidence of aneurysm, dissection, vasculitis, fibromuscular dysplasia or significant stenosis. IMA: Patent without evidence of aneurysm, dissection,  vasculitis or significant stenosis. Inflow: Patent without evidence of aneurysm, dissection, vasculitis or significant stenosis. Veins: No obvious venous abnormality within the limitations of this arterial phase study. Review of the MIP images confirms the above findings. NON-VASCULAR Hepatobiliary: Evaluation is limited by the patient's body habitus and phase of contrast enhancement. Allowing for this, the liver and gallbladder are unremarkable. No intra or extrahepatic biliary ductal dilation. Pancreas: Unremarkable Spleen: Unremarkable Adrenals/Urinary Tract: The adrenal glands are unremarkable. The kidneys are normal. Bladder is partially obscured by streak artifact, however, the visualized portion is unremarkable. Stomach/Bowel: Stomach is within normal limits. Appendix appears normal. No evidence of bowel wall thickening, distention, or inflammatory changes. No free intraperitoneal gas or fluid. Lymphatic: No pathologic adenopathy within the abdomen and pelvis. Reproductive: Obscured by streak artifact Other: Tiny fat containing umbilical hernia. Musculoskeletal: Surgical changes of right total hip arthroplasty are identified. Osseous structures are otherwise age-appropriate. No acute bone abnormality. Review of the MIP images confirms the above findings. IMPRESSION: 1. Normal examination of the thoracoabdominal aorta. No evidence of aortic dissection or aneurysm. 2. No acute intrathoracic or intra-abdominal pathology identified. Aortic Atherosclerosis (ICD10-I70.0). Electronically Signed   By: Helyn Numbers M.D.   On: 11/28/2022 20:29   CT ANGIO HEAD NECK W WO CM (CODE STROKE)  Result Date: 11/28/2022 CLINICAL DATA:  Stroke, follow up EXAM: CT ANGIOGRAPHY HEAD AND NECK WITH AND WITHOUT CONTRAST TECHNIQUE: Multidetector CT imaging of the head and neck was performed using the standard protocol during bolus administration of intravenous contrast. Multiplanar CT image reconstructions and MIPs were obtained  to evaluate the vascular anatomy. Carotid stenosis measurements (when applicable) are obtained utilizing NASCET criteria, using the distal internal carotid diameter as the denominator. RADIATION DOSE REDUCTION: This exam was performed according to the departmental dose-optimization program which includes automated exposure control, adjustment of the mA and/or kV according to patient size and/or use of iterative reconstruction technique. CONTRAST:  OMNIPAQUE IOHEXOL 350 MG/ML  SOLN COMPARISON:  Same day CT head. FINDINGS: CTA NECK FINDINGS Aortic arch: Great vessel origins are patent without significant stenosis. Right carotid system: No evidence of dissection, stenosis (50% or greater), or occlusion. Left carotid system: No evidence of dissection, stenosis (50% or greater), or occlusion. Vertebral arteries: Left dominant. Both vertebral arteries are patent without greater than 50% stenosis. Evaluation is limited by streak artifact through the vertebral arteries. Skeleton: Negative. Other neck: Negative. Upper chest: Visualized lung apices are clear. Review of the MIP images confirms the above findings CTA HEAD FINDINGS Anterior circulation: Bilateral intracranial ICAs, MCAs, and ACAs are patent without proximal hemodynamically significant stenosis. Posterior circulation: Bilateral intradural vertebral arteries, basilar artery and bilateral posterior cerebral arteries are patent without proximal hemodynamically significant stenosis. Right intradural vertebral artery is non dominant and tortuous proximally. Venous sinuses: As permitted by contrast timing, patent. Review of the MIP images confirms the above findings IMPRESSION: No emergent large vessel occlusion or proximal hemodynamically significant stenosis. Findings discussed with Dr. Selina Cooley via telephone at 06:25 p.m. Electronically Signed   By: Feliberto Harts M.D.   On: 11/28/2022 18:37   CT HEAD CODE STROKE WO CONTRAST  Result Date:  11/28/2022 CLINICAL DATA:  Code stroke.  Neuro deficit, acute, stroke suspected EXAM: CT HEAD WITHOUT CONTRAST TECHNIQUE: Contiguous axial images were obtained from the base of the skull through the vertex without intravenous contrast. RADIATION DOSE REDUCTION: This exam was performed according to the departmental dose-optimization program which includes automated exposure control, adjustment of the mA and/or kV according to patient size and/or use of iterative reconstruction technique. COMPARISON:  None Available. FINDINGS: Brain: No evidence of acute infarction, hemorrhage, hydrocephalus, extra-axial collection or mass lesion/mass effect. Vascular: No hyperdense vessel identified. Skull: No acute fracture. Sinuses/Orbits: No acute finding. Other: None. ASPECTS [facility] Stroke Program Early CT Score) Total score (0-10 with 10 being normal): 10. IMPRESSION: 1. No evidence of acute intracranial abnormality. 2. ASPECTS is 10. Code stroke imaging results were communicated on 11/28/2022 at 5:53 pm to provider [facility] via secure text paging. Electronically Signed   By: Feliberto Harts M.D.   On: 11/28/2022 17:54    Pending Labs Unresulted Labs (From admission, onward)     Start     Ordered   11/28/22 1716  Urine rapid drug screen (hosp performed)  Once,   STAT        11/28/22 1715   11/28/22 1703  Urinalysis, Routine w reflex microscopic -Urine, Clean Catch  Once,   URGENT       Question:  Specimen Source  Answer:  Urine, Clean Catch   11/28/22 1703            Vitals/Pain Today's Vitals   11/29/22 0200 11/29/22 0220 11/29/22 0300 11/29/22 0400  BP: 137/85  121/61 134/72  Pulse: 91  93 94  Resp: 16  18 15   Temp:      TempSrc:      SpO2: 100%  96% 90%  Weight:      Height:      PainSc:  Asleep      Isolation Precautions No active isolations  Medications Medications  atorvastatin (LIPITOR) tablet 40 mg (has no administration in time range)  diphenhydrAMINE (BENADRYL) capsule 25 mg (has  no administration in time range)   stroke: early stages of recovery book (has no administration in time range)  acetaminophen (TYLENOL) tablet 650 mg (has no administration in time range)    Or  acetaminophen (TYLENOL) 160 MG/5ML solution 650  mg (has no administration in time range)    Or  acetaminophen (TYLENOL) suppository 650 mg (has no administration in time range)  senna-docusate (Senokot-S) tablet 1 tablet (has no administration in time range)  sodium chloride flush (NS) 0.9 % injection 3 mL (3 mLs Intravenous Given 11/28/22 2330)  sodium chloride flush (NS) 0.9 % injection 3 mL (has no administration in time range)  0.9 %  sodium chloride infusion (has no administration in time range)  insulin glargine-yfgn (SEMGLEE) injection 40 Units (40 Units Subcutaneous Given 11/28/22 2330)  insulin aspart (novoLOG) injection 0-9 Units (has no administration in time range)  insulin aspart (novoLOG) injection 0-5 Units ( Subcutaneous Not Given 11/28/22 2328)  ondansetron (ZOFRAN) injection 4 mg (has no administration in time range)  meclizine (ANTIVERT) tablet 25 mg (25 mg Oral Given 11/28/22 2333)  diazepam (VALIUM) tablet 2 mg (has no administration in time range)  LORazepam (ATIVAN) injection 1 mg (has no administration in time range)  ondansetron (ZOFRAN-ODT) disintegrating tablet 4 mg (4 mg Oral Given 11/28/22 1721)  ondansetron (ZOFRAN) injection 4 mg (4 mg Intravenous Given 11/28/22 1835)  iohexol (OMNIPAQUE) 350 MG/ML injection 100 mL (100 mLs Intravenous Contrast Given 11/28/22 1822)    Mobility walks with person assist     Focused Assessments Neuro Assessment Handoff:  Swallow screen pass? Yes  Cardiac Rhythm: Normal sinus rhythm NIH Stroke Scale  Dizziness Present: No Headache Present: No Interval: Shift assessment Level of Consciousness (1a.)   : Alert, keenly responsive LOC Questions (1b. )   : Answers both questions correctly LOC Commands (1c. )   : Performs both tasks  correctly Best Gaze (2. )  : Normal Visual (3. )  : No visual loss Facial Palsy (4. )    : Normal symmetrical movements Motor Arm, Left (5a. )   : No drift Motor Arm, Right (5b. ) : No drift Motor Leg, Left (6a. )  : No drift Motor Leg, Right (6b. ) : No drift Limb Ataxia (7. ): Absent Sensory (8. )  : Normal, no sensory loss Best Language (9. )  : No aphasia Dysarthria (10. ): Normal Extinction/Inattention (11.)   : No Abnormality Complete NIHSS TOTAL: 0 Last date known well: 11/28/22 Last time known well: 1500 Neuro Assessment: Within Defined Limits Neuro Checks:   Initial (11/28/22 1730)  Has TPA been given? No If patient is a Neuro Trauma and patient is going to OR before floor call report to 4N Charge nurse: [phone] or [phone]   R Recommendations: See Admitting Provider Note  Report given to:   Additional Notes: Pt is very pleasant, alert, oriented, coming from home. Was a stroke alert, appears to be vertigo but more tests to confirm.

## 2022-11-29 NOTE — Progress Notes (Signed)
PROGRESS NOTE  [name]    DOB: Nov 13, 1965, 57 y.o.  UYQ:[id number]    Code Status: Full Code   DOA: 11/28/2022   LOS: 0   Brief hospital course  GILLIE ADDARIO is a 57 y.o. male with medical history significant for insulin-dependent diabetes mellitus, history of DVT and PE on Eliquis, OSA, chronic HFmrEF, and BMI 49. They presented with acute onset of dizziness, nausea, and vomiting.   Patient was in his usual state when he developed acute onset of dizziness, nausea, vomiting, and difficulty with balance.    ED Course: Upon arrival to the ED, patient is found to be afebrile and saturating mid 90s on room air with normal heart rate and stable blood pressure.   EKG: sinus rhythm with LAFB.   Chest x-ray is negative for acute cardiopulmonary disease.   No acute findings are noted on head CT or CTA head and neck.  Labs are most notable for glucose 218 and WBC 14,800.   Neurology evaluated the patient in the emergency department and recommended medical admission for further evaluation and management of suspected posterior circulation stroke.  Unable to undergo brain MRI due to weight limits.   Patient was admitted to medicine service for further workup and management of vertigo as outlined in detail below.  11/29/22 -remains dizzy with movement of head. Denies symptoms at rest.   Assessment & Plan  Principal Problem:   Vertigo Active Problems:   DM2 (diabetes mellitus, type 2) (HCC)   History of pulmonary embolism   OSA (obstructive sleep apnea)   Heart failure with mildly reduced ejection fraction (HFmrEF) (HCC)  Vertigo - Concerning for posterior circulation CVA per neuro evaluation. Head CT negative acute changes. Patients description of dizziness present only with head movement and not when at rest, I suspect inner ear etiology rather.  PT evaluated with Dix-hallpike which caused patient to be nauseous and vomit. Was noted to have L beating nystagmus. They will return for  vestibular evaluation.  - continue PT evaluation - neurology following, appreciate your recs   Chronic HFmrEF- Appears compensated. Repeat echo scheduled - restart home Coreg   Insulin-dependent DM - A1c was 11.6% in November 2023  - Check CBGs and use long- and short-acting insulin    Hx of recurrent DVT and PE  - LE dopplers pending - continue eliquis if ruled out CVA   Chest pain  - Patient developed chest discomfort on arrival to ED  - Initial troponin is normal, no acute ischemic features noted on EKG, and no acute findings are noted on CTA chest/abd/pelvis - Continue cardiac monitoring, repeat troponin, follow-up echo findings    OSA  - CPAP while sleeping   Body mass index is 49.38 kg/m.  VTE ppx: SCD's Start: 11/28/22 2149   Diet:     Diet   Diet regular Fluid consistency: Thin   Consultants: Neurology   Subjective 11/29/22    Pt reports feeling alright while he lays perfectly still. Still has dizziness when he turns his head or gets up from bed. Causes nausea. Denies any weakness.    Objective   Vitals:   11/29/22 0500 11/29/22 0530 11/29/22 0617 11/29/22 0740  BP: 129/79 124/75 133/87 133/69  Pulse: 91 91 87 86  Resp: 15 19 20 19   Temp:   98.3 F (36.8 C) 97.8 F (36.6 C)  TempSrc:    Oral  SpO2: 98% 100% 94% 96%  Weight:      Height:  No intake or output data in the 24 hours ending 11/29/22 0745 Filed Weights   11/28/22 1839  Weight: (!) 160.6 kg     Physical Exam:  General: awake, alert, NAD. Very hard to wake up. HEENT: atraumatic, clear conjunctiva, anicteric sclera, MMM, hearing grossly normal Respiratory: normal respiratory effort. Cardiovascular: quick capillary refill, normal S1/S2, RRR, no JVD, murmurs Nervous: A&O x3. no gross focal neurologic deficits, normal speech Extremities: moves all equally, no edema, normal tone Skin: dry, intact, normal temperature, normal color. No rashes, lesions or ulcers on exposed  skin Psychiatry: flat mood, congruent affect  Labs   I have personally reviewed the following labs and imaging studies CBC    Component Value Date/Time   WBC 13.6 (H) 11/29/2022 0255   RBC 5.13 11/29/2022 0255   HGB 11.8 (L) 11/29/2022 0255   HCT 40.5 11/29/2022 0255   PLT 360 11/29/2022 0255   MCV 78.9 (L) 11/29/2022 0255   MCH 23.0 (L) 11/29/2022 0255   MCHC 29.1 (L) 11/29/2022 0255   RDW 15.2 11/29/2022 0255   LYMPHSABS 1.7 11/28/2022 1849   MONOABS 0.8 11/28/2022 1849   EOSABS 0.0 11/28/2022 1849   BASOSABS 0.0 11/28/2022 1849      Latest Ref Rng & Units 11/29/2022    2:55 AM 11/28/2022    7:15 PM 11/28/2022    6:49 PM  BMP  Glucose 70 - 99 mg/dL 161  096  045   BUN 6 - 20 mg/dL 10  14  10    Creatinine 0.61 - 1.24 mg/dL 4.09  8.11  9.14   Sodium 135 - 145 mmol/L 136  138  136   Potassium 3.5 - 5.1 mmol/L 4.3  4.1  3.9   Chloride 98 - 111 mmol/L 100  99  97   CO2 22 - 32 mmol/L 26   26   Calcium 8.9 - 10.3 mg/dL 8.7   8.5     DG Chest Port 1 View  Result Date: 11/28/2022 CLINICAL DATA:  Chest pain EXAM: PORTABLE CHEST 1 VIEW COMPARISON:  08/10/2020 FINDINGS: The heart size and mediastinal contours are within normal limits. Both lungs are clear. The visualized skeletal structures are unremarkable. IMPRESSION: No active disease. Electronically Signed   By: Helyn Numbers M.D.   On: 11/28/2022 20:29   CT Angio Chest/Abd/Pel for Dissection W and/or W/WO  Result Date: 11/28/2022 CLINICAL DATA:  Dizziness, chest pain, vomiting, left leg weakness EXAM: CT ANGIOGRAPHY CHEST, ABDOMEN AND PELVIS TECHNIQUE: Non-contrast CT of the chest was initially obtained. Multidetector CT imaging through the chest, abdomen and pelvis was performed using the standard protocol during bolus administration of intravenous contrast. Multiplanar reconstructed images and MIPs were obtained and reviewed to evaluate the vascular anatomy. RADIATION DOSE REDUCTION: This exam was performed according to the  departmental dose-optimization program which includes automated exposure control, adjustment of the mA and/or kV according to patient size and/or use of iterative reconstruction technique. CONTRAST:  OMNIPAQUE IOHEXOL 350 MG/ML SOLN COMPARISON:  None Available. FINDINGS: CTA CHEST FINDINGS Cardiovascular: There is preferential opacification of the thoracic aorta. No intramural hematoma, dissection, or aneurysm. Mild atherosclerotic calcification within the proximal descending thoracic aorta. No significant coronary artery calcification. Global cardiac size within normal limits. No pericardial effusion. Central pulmonary arteries are of normal caliber. Poor opacification the pulmonary arterial tree, as this examination was designed for assessment of the thoracic aorta, precludes evaluation for pulmonary embolism. Mediastinum/Nodes: No enlarged mediastinal, hilar, or axillary lymph nodes. Thyroid gland, trachea, and  esophagus demonstrate no significant findings. Lungs/Pleura: Lungs are clear. No pleural effusion or pneumothorax. Musculoskeletal: No chest wall abnormality. No acute or significant osseous findings. Review of the MIP images confirms the above findings. CTA ABDOMEN AND PELVIS FINDINGS VASCULAR Aorta: Normal caliber aorta without aneurysm, dissection, vasculitis or significant stenosis. Celiac: Patent without evidence of aneurysm, dissection, vasculitis or significant stenosis. SMA: Patent without evidence of aneurysm, dissection, vasculitis or significant stenosis. Renals: Both renal arteries are patent without evidence of aneurysm, dissection, vasculitis, fibromuscular dysplasia or significant stenosis. IMA: Patent without evidence of aneurysm, dissection, vasculitis or significant stenosis. Inflow: Patent without evidence of aneurysm, dissection, vasculitis or significant stenosis. Veins: No obvious venous abnormality within the limitations of this arterial phase study. Review of the MIP images  confirms the above findings. NON-VASCULAR Hepatobiliary: Evaluation is limited by the patient's body habitus and phase of contrast enhancement. Allowing for this, the liver and gallbladder are unremarkable. No intra or extrahepatic biliary ductal dilation. Pancreas: Unremarkable Spleen: Unremarkable Adrenals/Urinary Tract: The adrenal glands are unremarkable. The kidneys are normal. Bladder is partially obscured by streak artifact, however, the visualized portion is unremarkable. Stomach/Bowel: Stomach is within normal limits. Appendix appears normal. No evidence of bowel wall thickening, distention, or inflammatory changes. No free intraperitoneal gas or fluid. Lymphatic: No pathologic adenopathy within the abdomen and pelvis. Reproductive: Obscured by streak artifact Other: Tiny fat containing umbilical hernia. Musculoskeletal: Surgical changes of right total hip arthroplasty are identified. Osseous structures are otherwise age-appropriate. No acute bone abnormality. Review of the MIP images confirms the above findings. IMPRESSION: 1. Normal examination of the thoracoabdominal aorta. No evidence of aortic dissection or aneurysm. 2. No acute intrathoracic or intra-abdominal pathology identified. Aortic Atherosclerosis (ICD10-I70.0). Electronically Signed   By: Helyn Numbers M.D.   On: 11/28/2022 20:29   CT ANGIO HEAD NECK W WO CM (CODE STROKE)  Result Date: 11/28/2022 CLINICAL DATA:  Stroke, follow up EXAM: CT ANGIOGRAPHY HEAD AND NECK WITH AND WITHOUT CONTRAST TECHNIQUE: Multidetector CT imaging of the head and neck was performed using the standard protocol during bolus administration of intravenous contrast. Multiplanar CT image reconstructions and MIPs were obtained to evaluate the vascular anatomy. Carotid stenosis measurements (when applicable) are obtained utilizing NASCET criteria, using the distal internal carotid diameter as the denominator. RADIATION DOSE REDUCTION: This exam was performed according  to the departmental dose-optimization program which includes automated exposure control, adjustment of the mA and/or kV according to patient size and/or use of iterative reconstruction technique. CONTRAST:  OMNIPAQUE IOHEXOL 350 MG/ML SOLN COMPARISON:  Same day CT head. FINDINGS: CTA NECK FINDINGS Aortic arch: Great vessel origins are patent without significant stenosis. Right carotid system: No evidence of dissection, stenosis (50% or greater), or occlusion. Left carotid system: No evidence of dissection, stenosis (50% or greater), or occlusion. Vertebral arteries: Left dominant. Both vertebral arteries are patent without greater than 50% stenosis. Evaluation is limited by streak artifact through the vertebral arteries. Skeleton: Negative. Other neck: Negative. Upper chest: Visualized lung apices are clear. Review of the MIP images confirms the above findings CTA HEAD FINDINGS Anterior circulation: Bilateral intracranial ICAs, MCAs, and ACAs are patent without proximal hemodynamically significant stenosis. Posterior circulation: Bilateral intradural vertebral arteries, basilar artery and bilateral posterior cerebral arteries are patent without proximal hemodynamically significant stenosis. Right intradural vertebral artery is non dominant and tortuous proximally. Venous sinuses: As permitted by contrast timing, patent. Review of the MIP images confirms the above findings IMPRESSION: No emergent large vessel occlusion or proximal hemodynamically significant  stenosis. Findings discussed with Dr. Selina Cooley via telephone at 06:25 p.m. Electronically Signed   By: Feliberto Harts M.D.   On: 11/28/2022 18:37   CT HEAD CODE STROKE WO CONTRAST  Result Date: 11/28/2022 CLINICAL DATA:  Code stroke.  Neuro deficit, acute, stroke suspected EXAM: CT HEAD WITHOUT CONTRAST TECHNIQUE: Contiguous axial images were obtained from the base of the skull through the vertex without intravenous contrast. RADIATION DOSE REDUCTION:  This exam was performed according to the departmental dose-optimization program which includes automated exposure control, adjustment of the mA and/or kV according to patient size and/or use of iterative reconstruction technique. COMPARISON:  None Available. FINDINGS: Brain: No evidence of acute infarction, hemorrhage, hydrocephalus, extra-axial collection or mass lesion/mass effect. Vascular: No hyperdense vessel identified. Skull: No acute fracture. Sinuses/Orbits: No acute finding. Other: None. ASPECTS [facility] Stroke Program Early CT Score) Total score (0-10 with 10 being normal): 10. IMPRESSION: 1. No evidence of acute intracranial abnormality. 2. ASPECTS is 10. Code stroke imaging results were communicated on 11/28/2022 at 5:53 pm to provider [facility] via secure text paging. Electronically Signed   By: Feliberto Harts M.D.   On: 11/28/2022 17:54    Disposition Plan & Communication  Patient status: Observation  Admitted From: Home Planned disposition location: Home Anticipated discharge date: 9/25 pending symptom improvement  Family Communication: none at bedside    Author: Leeroy Bock, DO [facility] 11/29/2022, 7:45 AM   Available by Epic secure chat 7AM-7PM. If 7PM-7AM, please contact night-coverage.  [facility] contact information found on [email or web address].

## 2022-11-29 NOTE — ED Provider Notes (Signed)
[place] [facility] AT [facility] Provider Note   CSN: [id number] Arrival date & time: 11/28/22  1637     History Chief Complaint  Patient presents with   Chest Pain   Emesis   Dizziness    HPI Chad Rivas is a 57 y.o. male presenting for acute onset dizziness that started about an hour ago. He describes he went to out of his car to the field to work and then came back to his car. Suddenly he felt that everything was dizzy, nauseous, vomiting and has not been steady on his feet. He denies any sick contacts, any new foods, any fevers, chills, abdominal pain. He denies this ever happening to him.    Patient's recorded medical, surgical, social, medication list and allergies were reviewed in the Snapshot window as part of the initial history.   Review of Systems   Review of Systems  Constitutional:  Positive for activity change, diaphoresis and fatigue. Negative for chills and fever.  Respiratory:  Positive for chest tightness. Negative for cough and shortness of breath.   Cardiovascular:  Positive for chest pain. Negative for leg swelling.  Gastrointestinal:  Positive for nausea and vomiting. Negative for abdominal distention, abdominal pain and diarrhea.  Endocrine: Negative for polydipsia and polyuria.  Genitourinary:  Negative for difficulty urinating.  Musculoskeletal:  Positive for gait problem. Negative for back pain.  Neurological:  Positive for weakness and light-headedness. Negative for dizziness, tremors, facial asymmetry, numbness and headaches.    Physical Exam Updated Vital Signs BP (!) 147/86   Pulse 97   Temp 98.4 F (36.9 C) (Oral)   Resp 16   Ht 5\' 11"  (1.803 m)   Wt (!) 160.6 kg   SpO2 91%   BMI 49.38 kg/m  Physical Exam Constitutional:      General: He is in acute distress.     Appearance: He is obese. He is ill-appearing, toxic-appearing and diaphoretic.  Eyes:     General: No visual field deficit. Cardiovascular:      Rate and Rhythm: Regular rhythm. Tachycardia present.     Heart sounds: Heart sounds not distant. No murmur heard.    No friction rub. No gallop.  Pulmonary:     Effort: Tachypnea present. No respiratory distress.     Breath sounds: No stridor. No decreased breath sounds, wheezing or rhonchi.  Chest:     Chest wall: No mass or tenderness.  Abdominal:     General: Bowel sounds are normal.     Palpations: Abdomen is soft. There is no hepatomegaly, splenomegaly or mass.     Tenderness: There is no abdominal tenderness. There is no guarding.  Neurological:     Mental Status: He is lethargic.     Cranial Nerves: No dysarthria or facial asymmetry.     Sensory: Sensation is intact.     Motor: Tremor present. No weakness, atrophy, abnormal muscle tone, seizure activity or pronator drift.     Coordination: Finger-Nose-Finger Test and Heel to O'Brien Test normal.     Gait: Gait abnormal.     Comments: Rotary nystagmus.      ED Course/ Medical Decision Making/ A&P Clinical Course as of 11/29/22 0034  Mon Nov 28, 2022  4098 I was approached by the resident due to severity of patient's presentation. Patient presenting with 2 hours of severe vertigo sudden onset with ataxia dizziness nausea vomiting that is profound. I activated as a code stroke due to symptoms. Neurology at bedside immediately  upon activation.  Agree with diagnosis.  Large vessel imaging completed due to event presence of diaphoresis to rule out vascular disease including vascular dissection in the setting of anticoagulation use. Unfortunately patient is not a candidate for tenecteplase due to comorbid utilization of anticoagulation which he stated he is compliant on. Will continue supportive care in emergency room and arrange for admission to medicine on completion of scans.    [CC]    Clinical Course User Index [CC] Glyn Ade, MD    Procedures .Critical Care  Performed by: Glyn Ade, MD Authorized by:  Glyn Ade, MD   Critical care provider statement:    Critical care time (minutes):  95   Critical care was time spent personally by me on the following activities:  Development of treatment plan with patient or surrogate, discussions with consultants, evaluation of patient's response to treatment, examination of patient, ordering and review of laboratory studies, ordering and review of radiographic studies, ordering and performing treatments and interventions, pulse oximetry, re-evaluation of patient's condition and review of old charts    Medications Ordered in ED Medications  atorvastatin (LIPITOR) tablet 40 mg (has no administration in time range)  diphenhydrAMINE (BENADRYL) capsule 25 mg (has no administration in time range)   stroke: early stages of recovery book (has no administration in time range)  acetaminophen (TYLENOL) tablet 650 mg (has no administration in time range)    Or  acetaminophen (TYLENOL) 160 MG/5ML solution 650 mg (has no administration in time range)    Or  acetaminophen (TYLENOL) suppository 650 mg (has no administration in time range)  senna-docusate (Senokot-S) tablet 1 tablet (has no administration in time range)  sodium chloride flush (NS) 0.9 % injection 3 mL (3 mLs Intravenous Given 11/28/22 2330)  sodium chloride flush (NS) 0.9 % injection 3 mL (has no administration in time range)  0.9 %  sodium chloride infusion (has no administration in time range)  insulin glargine-yfgn (SEMGLEE) injection 40 Units (40 Units Subcutaneous Given 11/28/22 2330)  insulin aspart (novoLOG) injection 0-9 Units (has no administration in time range)  insulin aspart (novoLOG) injection 0-5 Units ( Subcutaneous Not Given 11/28/22 2328)  ondansetron (ZOFRAN) injection 4 mg (has no administration in time range)  meclizine (ANTIVERT) tablet 25 mg (25 mg Oral Given 11/28/22 2333)  diazepam (VALIUM) tablet 2 mg (has no administration in time range)  LORazepam (ATIVAN) injection 1 mg  (has no administration in time range)  ondansetron (ZOFRAN-ODT) disintegrating tablet 4 mg (4 mg Oral Given 11/28/22 1721)  ondansetron (ZOFRAN) injection 4 mg (4 mg Intravenous Given 11/28/22 1835)  iohexol (OMNIPAQUE) 350 MG/ML injection 100 mL (100 mLs Intravenous Contrast Given 11/28/22 1822)    Medical Decision Making:    [name] is a 57 y.o. male who presented to the ED today with acute onset dizziness and vomiting as detailed above.     Complete initial physical exam performed, notably the patient  was concerning for a stroke. A code stroke was called.    Reviewed and confirmed nursing documentation for past medical history, family history, social history.    Initial Assessment:   With the patient's presentation of sudden onset dizziness, nystagmus and ataxia is most concerning for a posterior stroke. However, this could also be BPPV or less likely a viral infection. These are considered less likely due to history of present illness and physical exam findings.   This is most consistent with an acute complicated illness  Initial Plan:  Code Stroke- CT head,  CTA  Screening labs including CBC and Metabolic panel to evaluate for infectious or metabolic etiology of disease.  Coagulation studies  EKG to evaluate for cardiac pathology. Troponins Objective evaluation as below reviewed with plan for close reassessment  Initial Study Results:   Laboratory  All laboratory results reviewed without evidence of clinically relevant pathology.    CT head - IMPRESSION: 1. No evidence of acute intracranial abnormality. 2. ASPECTS is 10.  CTA- No stroke    EKG EKG was reviewed independently. Rate, rhythm, axis, intervals all examined and without medically relevant abnormality. ST segments without concerns for elevations.    Radiology  All images reviewed independently. Agree with radiology report at this time.   DG Chest Port 1 View  Result Date: 11/28/2022 CLINICAL DATA:  Chest  pain EXAM: PORTABLE CHEST 1 VIEW COMPARISON:  08/10/2020 FINDINGS: The heart size and mediastinal contours are within normal limits. Both lungs are clear. The visualized skeletal structures are unremarkable. IMPRESSION: No active disease. Electronically Signed   By: Helyn Numbers M.D.   On: 11/28/2022 20:29   CT Angio Chest/Abd/Pel for Dissection W and/or W/WO  Result Date: 11/28/2022 CLINICAL DATA:  Dizziness, chest pain, vomiting, left leg weakness EXAM: CT ANGIOGRAPHY CHEST, ABDOMEN AND PELVIS TECHNIQUE: Non-contrast CT of the chest was initially obtained. Multidetector CT imaging through the chest, abdomen and pelvis was performed using the standard protocol during bolus administration of intravenous contrast. Multiplanar reconstructed images and MIPs were obtained and reviewed to evaluate the vascular anatomy. RADIATION DOSE REDUCTION: This exam was performed according to the departmental dose-optimization program which includes automated exposure control, adjustment of the mA and/or kV according to patient size and/or use of iterative reconstruction technique. CONTRAST:  OMNIPAQUE IOHEXOL 350 MG/ML SOLN COMPARISON:  None Available. FINDINGS: CTA CHEST FINDINGS Cardiovascular: There is preferential opacification of the thoracic aorta. No intramural hematoma, dissection, or aneurysm. Mild atherosclerotic calcification within the proximal descending thoracic aorta. No significant coronary artery calcification. Global cardiac size within normal limits. No pericardial effusion. Central pulmonary arteries are of normal caliber. Poor opacification the pulmonary arterial tree, as this examination was designed for assessment of the thoracic aorta, precludes evaluation for pulmonary embolism. Mediastinum/Nodes: No enlarged mediastinal, hilar, or axillary lymph nodes. Thyroid gland, trachea, and esophagus demonstrate no significant findings. Lungs/Pleura: Lungs are clear. No pleural effusion or pneumothorax.  Musculoskeletal: No chest wall abnormality. No acute or significant osseous findings. Review of the MIP images confirms the above findings. CTA ABDOMEN AND PELVIS FINDINGS VASCULAR Aorta: Normal caliber aorta without aneurysm, dissection, vasculitis or significant stenosis. Celiac: Patent without evidence of aneurysm, dissection, vasculitis or significant stenosis. SMA: Patent without evidence of aneurysm, dissection, vasculitis or significant stenosis. Renals: Both renal arteries are patent without evidence of aneurysm, dissection, vasculitis, fibromuscular dysplasia or significant stenosis. IMA: Patent without evidence of aneurysm, dissection, vasculitis or significant stenosis. Inflow: Patent without evidence of aneurysm, dissection, vasculitis or significant stenosis. Veins: No obvious venous abnormality within the limitations of this arterial phase study. Review of the MIP images confirms the above findings. NON-VASCULAR Hepatobiliary: Evaluation is limited by the patient's body habitus and phase of contrast enhancement. Allowing for this, the liver and gallbladder are unremarkable. No intra or extrahepatic biliary ductal dilation. Pancreas: Unremarkable Spleen: Unremarkable Adrenals/Urinary Tract: The adrenal glands are unremarkable. The kidneys are normal. Bladder is partially obscured by streak artifact, however, the visualized portion is unremarkable. Stomach/Bowel: Stomach is within normal limits. Appendix appears normal. No evidence of bowel wall thickening, distention, or  inflammatory changes. No free intraperitoneal gas or fluid. Lymphatic: No pathologic adenopathy within the abdomen and pelvis. Reproductive: Obscured by streak artifact Other: Tiny fat containing umbilical hernia. Musculoskeletal: Surgical changes of right total hip arthroplasty are identified. Osseous structures are otherwise age-appropriate. No acute bone abnormality. Review of the MIP images confirms the above findings. IMPRESSION:  1. Normal examination of the thoracoabdominal aorta. No evidence of aortic dissection or aneurysm. 2. No acute intrathoracic or intra-abdominal pathology identified. Aortic Atherosclerosis (ICD10-I70.0). Electronically Signed   By: Helyn Numbers M.D.   On: 11/28/2022 20:29   CT ANGIO HEAD NECK W WO CM (CODE STROKE)  Result Date: 11/28/2022 CLINICAL DATA:  Stroke, follow up EXAM: CT ANGIOGRAPHY HEAD AND NECK WITH AND WITHOUT CONTRAST TECHNIQUE: Multidetector CT imaging of the head and neck was performed using the standard protocol during bolus administration of intravenous contrast. Multiplanar CT image reconstructions and MIPs were obtained to evaluate the vascular anatomy. Carotid stenosis measurements (when applicable) are obtained utilizing NASCET criteria, using the distal internal carotid diameter as the denominator. RADIATION DOSE REDUCTION: This exam was performed according to the departmental dose-optimization program which includes automated exposure control, adjustment of the mA and/or kV according to patient size and/or use of iterative reconstruction technique. CONTRAST:  OMNIPAQUE IOHEXOL 350 MG/ML SOLN COMPARISON:  Same day CT head. FINDINGS: CTA NECK FINDINGS Aortic arch: Great vessel origins are patent without significant stenosis. Right carotid system: No evidence of dissection, stenosis (50% or greater), or occlusion. Left carotid system: No evidence of dissection, stenosis (50% or greater), or occlusion. Vertebral arteries: Left dominant. Both vertebral arteries are patent without greater than 50% stenosis. Evaluation is limited by streak artifact through the vertebral arteries. Skeleton: Negative. Other neck: Negative. Upper chest: Visualized lung apices are clear. Review of the MIP images confirms the above findings CTA HEAD FINDINGS Anterior circulation: Bilateral intracranial ICAs, MCAs, and ACAs are patent without proximal hemodynamically significant stenosis. Posterior  circulation: Bilateral intradural vertebral arteries, basilar artery and bilateral posterior cerebral arteries are patent without proximal hemodynamically significant stenosis. Right intradural vertebral artery is non dominant and tortuous proximally. Venous sinuses: As permitted by contrast timing, patent. Review of the MIP images confirms the above findings IMPRESSION: No emergent large vessel occlusion or proximal hemodynamically significant stenosis. Findings discussed with Dr. Selina Cooley via telephone at 06:25 p.m. Electronically Signed   By: Feliberto Harts M.D.   On: 11/28/2022 18:37   CT HEAD CODE STROKE WO CONTRAST  Result Date: 11/28/2022 CLINICAL DATA:  Code stroke.  Neuro deficit, acute, stroke suspected EXAM: CT HEAD WITHOUT CONTRAST TECHNIQUE: Contiguous axial images were obtained from the base of the skull through the vertex without intravenous contrast. RADIATION DOSE REDUCTION: This exam was performed according to the departmental dose-optimization program which includes automated exposure control, adjustment of the mA and/or kV according to patient size and/or use of iterative reconstruction technique. COMPARISON:  None Available. FINDINGS: Brain: No evidence of acute infarction, hemorrhage, hydrocephalus, extra-axial collection or mass lesion/mass effect. Vascular: No hyperdense vessel identified. Skull: No acute fracture. Sinuses/Orbits: No acute finding. Other: None. ASPECTS [facility] Stroke Program Early CT Score) Total score (0-10 with 10 being normal): 10. IMPRESSION: 1. No evidence of acute intracranial abnormality. 2. ASPECTS is 10. Code stroke imaging results were communicated on 11/28/2022 at 5:53 pm to provider [facility] via secure text paging. Electronically Signed   By: Feliberto Harts M.D.   On: 11/28/2022 17:54     Consults:  Case discussed with neurology Dr. Selina Cooley.  Reassessment and Plan:   Patient's history present on his physicals and findings are consistent with acute  stroke. On generally not a candidate for TNKase in the emergency room. CT angiography chest abdomen pelvis without acute pathology or vascular disease. Patient arranged for admission for further care and management.   Disposition:   Based on the above findings, I believe this patient is stable for admission.    Patient/family educated about specific findings on our evaluation and explained exact reasons for admission.  Patient/family educated about clinical situation and time was allowed to answer questions.   Admission team communicated with and agreed with need for admission. Patient admitted. Patient  ready to move at this time.     Emergency Department Medication Summary:   Medications  atorvastatin (LIPITOR) tablet 40 mg (has no administration in time range)  diphenhydrAMINE (BENADRYL) capsule 25 mg (has no administration in time range)   stroke: early stages of recovery book (has no administration in time range)  acetaminophen (TYLENOL) tablet 650 mg (has no administration in time range)    Or  acetaminophen (TYLENOL) 160 MG/5ML solution 650 mg (has no administration in time range)    Or  acetaminophen (TYLENOL) suppository 650 mg (has no administration in time range)  senna-docusate (Senokot-S) tablet 1 tablet (has no administration in time range)  sodium chloride flush (NS) 0.9 % injection 3 mL (3 mLs Intravenous Given 11/28/22 2330)  sodium chloride flush (NS) 0.9 % injection 3 mL (has no administration in time range)  0.9 %  sodium chloride infusion (has no administration in time range)  insulin glargine-yfgn (SEMGLEE) injection 40 Units (40 Units Subcutaneous Given 11/28/22 2330)  insulin aspart (novoLOG) injection 0-9 Units (has no administration in time range)  insulin aspart (novoLOG) injection 0-5 Units ( Subcutaneous Not Given 11/28/22 2328)  ondansetron (ZOFRAN) injection 4 mg (has no administration in time range)  meclizine (ANTIVERT) tablet 25 mg (25 mg Oral Given  11/28/22 2333)  diazepam (VALIUM) tablet 2 mg (has no administration in time range)  LORazepam (ATIVAN) injection 1 mg (has no administration in time range)  ondansetron (ZOFRAN-ODT) disintegrating tablet 4 mg (4 mg Oral Given 11/28/22 1721)  ondansetron (ZOFRAN) injection 4 mg (4 mg Intravenous Given 11/28/22 1835)  iohexol (OMNIPAQUE) 350 MG/ML injection 100 mL (100 mLs Intravenous Contrast Given 11/28/22 1822)         Clinical Impression:  1. Cerebrovascular accident (CVA), unspecified mechanism (HCC)      Admit   Final Clinical Impression(s) / ED Diagnoses Final diagnoses:  Cerebrovascular accident (CVA), unspecified mechanism (HCC)    Rx / DC Orders ED Discharge Orders     None         Glyn Ade, MD 11/29/22 [phone]

## 2022-11-29 NOTE — Progress Notes (Signed)
Pt has left upper arm; 1 inch diameter circular rash with nodule like feeling. Itchy in nature. Wife and patient who is A&O report it has been ongoing for several years with unknown cause. I am giving PRN benadryl. Just notifying as it was not there this morning upon admission.   Dr. Margo Aye Notified.

## 2022-11-29 NOTE — ED Notes (Signed)
Patient desat to 85% when asleep. Placed on 2L of O2

## 2022-11-29 NOTE — Progress Notes (Signed)
Inpatient Rehab Admissions Coordinator:   Per therapy recommendation, patient was screened for CIR candidacy by Megan Salon, MS, CCC-SLP. Work up is ongoing and it is not clear if Pt. Demonstrates medical necessity for  a CIR admit. I will follow and rescreen once work is complete.   Please contact me with any questions.  Megan Salon, MS, CCC-SLP Rehab Admissions Coordinator  [phone] (celll) [phone] (office)

## 2022-11-29 NOTE — Progress Notes (Signed)
Pt has CPAP order, RT offered hospital machine, pt declined and says he will bring in home machine.

## 2022-11-29 NOTE — Progress Notes (Signed)
  Echocardiogram 2D Echocardiogram has been performed.  Chad Rivas 11/29/2022, 4:30 PM

## 2022-11-30 DIAGNOSIS — I5022 Chronic systolic (congestive) heart failure: Secondary | ICD-10-CM | POA: Diagnosis not present

## 2022-11-30 DIAGNOSIS — E119 Type 2 diabetes mellitus without complications: Secondary | ICD-10-CM | POA: Diagnosis not present

## 2022-11-30 DIAGNOSIS — I110 Hypertensive heart disease with heart failure: Secondary | ICD-10-CM | POA: Diagnosis not present

## 2022-11-30 DIAGNOSIS — R42 Dizziness and giddiness: Secondary | ICD-10-CM | POA: Diagnosis not present

## 2022-11-30 LAB — GLUCOSE, CAPILLARY
Glucose-Capillary: 190 mg/dL — ABNORMAL HIGH (ref 70–99)
Glucose-Capillary: 124 mg/dL — ABNORMAL HIGH (ref 70–99)
Glucose-Capillary: 147 mg/dL — ABNORMAL HIGH (ref 70–99)
Glucose-Capillary: 188 mg/dL — ABNORMAL HIGH (ref 70–99)

## 2022-11-30 LAB — BASIC METABOLIC PANEL
Anion gap: 9 (ref 5–15)
BUN: 11 mg/dL (ref 6–20)
CO2: 30 mmol/L (ref 22–32)
Calcium: 8.5 mg/dL — ABNORMAL LOW (ref 8.9–10.3)
Chloride: 98 mmol/L (ref 98–111)
Creatinine, Ser: 1.03 mg/dL (ref 0.61–1.24)
GFR, Estimated: >60 mL/min (ref >60)
Glucose, Bld: 184 mg/dL — ABNORMAL HIGH (ref 70–99)
Potassium: 3.7 mmol/L (ref 3.5–5.1)
Sodium: 137 mmol/L (ref 135–145)

## 2022-11-30 LAB — CBC
HCT: 39.9 % (ref 39.0–52.0)
Hemoglobin: 11.7 g/dL — ABNORMAL LOW (ref 13.0–17.0)
MCH: 23.3 pg — ABNORMAL LOW (ref 26.0–34.0)
MCHC: 29.3 g/dL — ABNORMAL LOW (ref 30.0–36.0)
MCV: 79.3 fL — ABNORMAL LOW (ref 80.0–100.0)
Platelets: 353 10*3/uL (ref 150–400)
RBC: 5.03 MIL/uL (ref 4.22–5.81)
RDW: 15.3 % (ref 11.5–15.5)
WBC: 8.4 10*3/uL (ref 4.0–10.5)
nRBC: 0 % (ref 0.0–0.2)

## 2022-11-30 MED ORDER — APIXABAN 5 MG PO TABS
5.0000 mg | ORAL_TABLET | Freq: Two times a day (BID) | ORAL | Status: DC
  Administered 2022-11-30 – 2022-12-01 (×3): 5 mg via ORAL
  Filled 2022-11-30 (×3): qty 1

## 2022-11-30 NOTE — TOC Progression Note (Signed)
Transition of Care [facility]) - Progression Note    Patient Details  Name: Chad Rivas MRN: [id number] Date of Birth: Sep 30, 1965  [facility] CM/SW Contact  Kermit Balo, RN Phone Number: 11/30/2022, 1:08 PM  Clinical Narrative:     Recommendations have changed to outpatient therapy. CM has sent the referral to [facility] per patient preference. Information on the AVS for patient to call and schedule the first appointment.  TOC following.  Expected Discharge Plan: IP Rehab Facility Barriers to Discharge: Continued Medical Work up  Expected Discharge Plan and Services   Discharge Planning Services: CM Consult   Living arrangements for the past 2 months: Single Family Home                                       Social Determinants of Health (SDOH) Interventions SDOH Screenings   Food Insecurity: No Food Insecurity (11/29/2022)  Housing: Low Risk  (11/29/2022)  Transportation Needs: No Transportation Needs (11/29/2022)  Utilities: Not At Risk (11/29/2022)  Tobacco Use: Low Risk  (11/28/2022)    Readmission Risk Interventions     No data to display

## 2022-11-30 NOTE — Plan of Care (Signed)
  Problem: Fluid Volume: Goal: Hemodynamic stability will improve Outcome: Progressing   Problem: Clinical Measurements: Goal: Diagnostic test results will improve Outcome: Progressing   Problem: Education: Goal: Ability to describe self-care measures that may prevent or decrease complications (Diabetes Survival Skills Education) will improve Outcome: Progressing   Problem: Coping: Goal: Ability to adjust to condition or change in health will improve Outcome: Progressing   Problem: Self-Care: Goal: Ability to participate in self-care as condition permits will improve Outcome: Progressing

## 2022-11-30 NOTE — Progress Notes (Signed)
Physical Therapy Treatment and Vestibular Assessment. Patient Details Name: Chad Rivas MRN: [id number] DOB: [birth year] Today's Date: 11/30/2022   History of Present Illness Pt is a 57 y/o M admitted on 11/28/22 after presenting with c/o acute onset of dizziness, N&V. Head CT was negative for acute issues, pt unable to receive MRI. 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 Rt CN-VIII vestibular hypofunction without hearing loss. Not consistent with BPPV, meniere's, or labyrinthitis. (See detailed findings below.) He does report having "Cold" like symptoms about 1 week ago as well.  Ambulating better today, still a little off balance but without physical assistance while using rollator. Pt developed Lt sided sharp chest pain ambulating - returned to bed, RN notified and assessing at end of session. MD secure chat. HR 70s without alert on tele.  Will continue to follow and progress during admission - recommend OPPT neuro vestibular rehab. Provided pt and wife with education and HEP. Reviewed BEFAST as well.    11/30/22 0001  Vestibular Assessment  General Observation Resting in bed, guarded head movements. Lt beating nystagmus at rest.  Symptom Behavior  Subjective history of current problem Reports sudden onset of dizziness (world moving) 9/23. Denies focal deficits. Symptoms were constant yesterday, a bit better today but not back to normal, worse with head movements, somewhat better with head still. Hx PE. DVT in LEs R/o during this admission. Denies changes in hearing, vision. No aural fullness or tinnitus reported. Subjectively reports having a "cold" for about 2 days last week that resolved. Has been taking meclizine this admission.  Type of Dizziness  Spinning;"World moves";"Funny feeling in head"  Frequency of  Dizziness constant  Duration of Dizziness constant  Symptom Nature Motion provoked;Spontaneous;Positional;Constant  Aggravating Factors Spontaneous onset;Lying supine;Sitting with head tilted back;Turning head quickly;Supine to sit  Relieving Factors Head stationary  Progression of Symptoms Better  History of similar episodes never  Oculomotor Exam  Oculomotor Alignment Normal  Ocular ROM wnl  Spontaneous Left beating nystagmus  Gaze-induced  Left beating nystagmus with R gaze;Left beating nystagmus with L gaze (reduced with Rt gaze (Alexander's Law))  Smooth Pursuits Saccades  Saccades Undershoots  Comment Head impulse test abnormal: on Right  Oculomotor Exam-Fixation Suppressed   Spontaneous Nystagmus left beat  Gaze evoked nystagmus Left beat  Left Head Impulse normal  Right Head Impulse Abnormal  Vestibulo-Ocular Reflex  VOR 1 Head Only (x 1 viewing) difficulty maintaining gaze with Rt head turn  Positional Testing  Dix-Hallpike Dix-Hallpike Right;Dix-Hallpike Left  Horizontal Canal Testing Horizontal Canal Right;Horizontal Canal Left  Dix-Hallpike Right  Dix-Hallpike Right Duration negative  Dix-Hallpike Right Symptoms Left nystagmus  Dix-Hallpike Left  Dix-Hallpike Left Duration negative  Dix-Hallpike Left Symptoms Left nystagmus  Horizontal Canal Right  Horizontal Canal Right Duration negative  Horizontal Canal Right Symptoms Ageotrophic ((baseline Lt beat))  Horizontal Canal Left  Horizontal Canal Left Duration negative  Horizontal Canal Left Symptoms Geotrophic (baseline Lt beat)  Cognition  Cognition Orientation Level Oriented x 4  Positional Sensitivities  Sit to Supine 3  Supine to Left Side 2  Supine to Right Side 2  Supine to Sitting 3  Right Hallpike 2  Up from Right Hallpike 2  Up from Left Hallpike 2  Rolling Right 2  Rolling Left 2      If plan is discharge home, recommend the  following: A little help with walking and/or transfers;A little help  with bathing/dressing/bathroom;Assistance with cooking/housework;Assist for transportation;Help with stairs or ramp for entrance   Can travel by private vehicle        Equipment Recommendations  None recommended by PT    Recommendations for Other Services       Precautions / Restrictions Precautions Precautions: Fall Restrictions Weight Bearing Restrictions: No     Mobility  Bed Mobility Overal bed mobility: Modified Independent             General bed mobility comments: extra time guarded from dizziness.    Transfers Overall transfer level: Needs assistance Equipment used: Rollator (4 wheels) Transfers: Sit to/from Stand Sit to Stand: Supervision           General transfer comment: supervision for safety, dizzy, mild sway, able to self correct.    Ambulation/Gait Ambulation/Gait assistance: Supervision Gait Distance (Feet): 150 Feet Assistive device: Rollator (4 wheels) Gait Pattern/deviations: Step-through pattern, Drifts right/left, Wide base of support Gait velocity: decr Gait velocity interpretation: <1.8 ft/sec, indicate of risk for recurrent falls   General Gait Details: Mild unsteadiness, slow and guarded. Educated on AD for support. Able to self correct. Stops several times due to feeling off balance. No buckling noted, no physical assist required for balance. Educated on safety and awareness, avoiding quick head turns. Pt reported Lt sided stabbing chest pain, returned to room and notified RN. HR in 70s, no tele alerts.   Stairs             Wheelchair Mobility     Tilt Bed    Modified Rankin (Stroke Patients Only)       Balance Overall balance assessment: Needs assistance Sitting-balance support: Feet supported Sitting balance-Leahy Scale: Good     Standing balance support: During functional activity, No upper extremity supported Standing balance-Leahy Scale: Fair Standing balance comment: more stable with UE use.                             Cognition Arousal: Alert Behavior During Therapy: WFL for tasks assessed/performed Overall Cognitive Status: Within Functional Limits for tasks assessed                                          Exercises Other Exercises Other Exercises: Brandt-Daroff and x1 view handout with instructions. Educated on habituation, symptom allowance and monitoring for appropriate range. Perform 2-5 min 5x/day    General Comments General comments (skin integrity, edema, etc.): See vestibular assessment. Extensive education on findings and follow-up.      Pertinent Vitals/Pain Pain Assessment Pain Assessment: Faces Faces Pain Scale: Hurts a little bit Pain Location: R knee Pain Descriptors / Indicators: Grimacing, Guarding Pain Intervention(s): Monitored during session    Home Living                          Prior Function            PT Goals (current goals can now be found in the care plan section) Acute Rehab PT Goals Patient Stated Goal: decreased dizziness, feel better PT Goal Formulation: With patient Time For Goal Achievement: 12/13/22 Potential to Achieve Goals: Good Progress towards PT goals: Progressing toward goals    Frequency    Min 1X/week  PT Plan      Co-evaluation              AM-PAC PT "6 Clicks" Mobility   Outcome Measure  Help needed turning from your back to your side while in a flat bed without using bedrails?: None Help needed moving from lying on your back to sitting on the side of a flat bed without using bedrails?: None Help needed moving to and from a bed to a chair (including a wheelchair)?: A Little Help needed standing up from a chair using your arms (e.g., wheelchair or bedside chair)?: A Little Help needed to walk in hospital room?: A Little Help needed climbing 3-5 steps with a railing? : A Little 6 Click Score: 20    End of Session Equipment Utilized During Treatment: Gait  belt Activity Tolerance: Patient tolerated treatment well (limited by N&V)   Nurse Communication: Mobility status;Other (comment) (chest pain with gait) PT Visit Diagnosis: Unsteadiness on feet (R26.81);Difficulty in walking, not elsewhere classified (R26.2);Dizziness and giddiness (R42)     Time: 1610-9604 PT Time Calculation (min) (ACUTE ONLY): 39 min  Charges:    $Gait Training: 8-22 mins $Therapeutic Exercise: 8-22 mins $Therapeutic Activity: 8-22 mins PT General Charges $$ ACUTE PT VISIT: 1 Visit                     Kathlyn Sacramento, PT, DPT [facility]  Rehabilitation Services Physical Therapist Office: [phone] Website: [email or web address]    Berton Mount 11/30/2022, 12:08 PM

## 2022-11-30 NOTE — Progress Notes (Signed)
Patient alert and oriented, still feeling mild dizzy, Patient had sharp pain in left side of the chest, while working with the PT which was ongoing for a while, EKG done, informed MD. Pain subsiding and now not in pain, will continue to monitor

## 2022-11-30 NOTE — Progress Notes (Addendum)
STROKE TEAM PROGRESS NOTE   BRIEF HPI [name] is a 57 y.o. male with history of T2DM, DVT and PE on Eliquis, OSA, BMI 49 presented with acute onset ataxia/dizziness/nausea/vomiting, CT head/CTA negative.  Symptoms concerning for posterior circulation CVA versus inner ear etiology of.  Unable to perform MRI.  SIGNIFICANT HOSPITAL EVENTS 9/24: Admission.  CT head and CTA negative. 9/25: Per PT vestbular eval: presentation suspicious for vestibular neuritis.  INTERIM HISTORY/SUBJECTIVE  On interview, patient is undergoing PT vestibular eval which suggest peripheral vestibular neuronitis. No new concerns. Neurological exam significant for room spinning on head movement, unchanged from yesterday, otherwise unremarkable.   OBJECTIVE  CBC    Component Value Date/Time   WBC 8.4 11/30/2022 0747   RBC 5.03 11/30/2022 0747   HGB 11.7 (L) 11/30/2022 0747   HCT 39.9 11/30/2022 0747   PLT 353 11/30/2022 0747   MCV 79.3 (L) 11/30/2022 0747   MCH 23.3 (L) 11/30/2022 0747   MCHC 29.3 (L) 11/30/2022 0747   RDW 15.3 11/30/2022 0747   LYMPHSABS 1.7 11/28/2022 1849   MONOABS 0.8 11/28/2022 1849   EOSABS 0.0 11/28/2022 1849   BASOSABS 0.0 11/28/2022 1849    BMET    Component Value Date/Time   NA 137 11/30/2022 0747   K 3.7 11/30/2022 0747   CL 98 11/30/2022 0747   CO2 30 11/30/2022 0747   GLUCOSE 184 (H) 11/30/2022 0747   BUN 11 11/30/2022 0747   CREATININE 1.03 11/30/2022 0747   CALCIUM 8.5 (L) 11/30/2022 0747   GFRNONAA >60 11/30/2022 0747    IMAGING past 24 hours CT HEAD WO CONTRAST ( )  Result Date: 11/30/2022 CLINICAL DATA:  Follow-up examination for neuro deficit, stroke suspected. EXAM: CT HEAD WITHOUT CONTRAST TECHNIQUE: Contiguous axial images were obtained from the base of the skull through the vertex without intravenous contrast. RADIATION DOSE REDUCTION: This exam was performed according to the departmental dose-optimization program which includes automated  exposure control, adjustment of the mA and/or kV according to patient size and/or use of iterative reconstruction technique. COMPARISON:  Prior CT from 11/28/2022. FINDINGS: Brain: Cerebral volume within normal limits. No acute intracranial hemorrhage. No acute large vessel territory infarct. No mass lesion, midline shift or mass effect. No hydrocephalus or extra-axial fluid collection. Vascular: No abnormal hyperdense vessel. Skull: Scalp soft tissues and calvarium demonstrate no new finding. Sinuses/Orbits: Globes orbital soft tissues within normal limits. Scattered mucosal thickening present about the sphenoid ethmoidal and maxillary sinuses. Mastoid air cells remain largely clear. Other: 1.8 cm expansile lucent lesion partially visualized at the left anterior maxilla (series 4, image 1), better seen on prior CTA. This appears to be related to the underlying dentition, likely a dentigerous cyst. IMPRESSION: Stable head CT.  No acute intracranial abnormality. Electronically Signed   By: Rise Mu M.D.   On: 11/30/2022 02:01   ECHOCARDIOGRAM COMPLETE  Result Date: 11/29/2022    ECHOCARDIOGRAM REPORT   Patient Name:   Chad Rivas Date of Exam: 11/29/2022 Medical Rec #:  [id number]      Height:       71.0 in Accession #:    [id number]     Weight:       354.1 lb Date of Birth:  1966/02/19       BSA:          2.688 m Patient Age:    57 years       BP:           129/81  mmHg Patient Gender: M              HR:           83 bpm. Exam Location:  Inpatient Procedure: 2D Echo, Cardiac Doppler, Color Doppler and Intracardiac            Opacification Agent Indications:    Stroke I63.9  History:        Patient has prior history of Echocardiogram examinations, most                 recent 08/11/2020. CHF; Risk Factors:Diabetes, Sleep Apnea and                 Non-Smoker.  Sonographer:    Aron Baba Referring Phys: [id number] TIMOTHY S OPYD  Sonographer Comments: No apical window and no subcostal window. IMPRESSIONS  1.  Left ventricular ejection fraction, by estimation, is 40 to 45%. The left ventricle has mildly decreased function. The left ventricle demonstrates global hypokinesis. There is severe asymmetric left ventricular hypertrophy of the septal segment. Left  ventricular diastolic parameters are consistent with Grade I diastolic dysfunction (impaired relaxation). There is the interventricular septum is flattened in systole and diastole, consistent with right ventricular pressure and volume overload.  2. Right ventricular systolic function is normal. The right ventricular size is severely enlarged. Moderately increased right ventricular wall thickness.  3. The mitral valve is normal in structure. No evidence of mitral valve regurgitation.  4. The aortic valve is tricuspid. Aortic valve regurgitation is not visualized. No aortic stenosis is present.  5. Aortic dilatation noted. There is mild dilatation of the aortic root, measuring 43 mm. Comparison(s): RV hypertrophy noted; RV has further dilated. FINDINGS  Left Ventricle: Left ventricular ejection fraction, by estimation, is 40 to 45%. The left ventricle has mildly decreased function. The left ventricle demonstrates global hypokinesis. Definity contrast agent was given IV to delineate the left ventricular  endocardial borders. The left ventricular internal cavity size was normal in size. There is severe asymmetric left ventricular hypertrophy of the septal segment. The interventricular septum is flattened in systole and diastole, consistent with right ventricular pressure and volume overload. Left ventricular diastolic parameters are consistent with Grade I diastolic dysfunction (impaired relaxation). Right Ventricle: The right ventricular size is severely enlarged. Moderately increased right ventricular wall thickness. Right ventricular systolic function is normal. Left Atrium: Left atrial size was normal in size. Right Atrium: Right atrial size was normal in size.  Pericardium: There is no evidence of pericardial effusion. Mitral Valve: The mitral valve is normal in structure. No evidence of mitral valve regurgitation. Tricuspid Valve: The tricuspid valve is normal in structure. Tricuspid valve regurgitation is not demonstrated. No evidence of tricuspid stenosis. Aortic Valve: The aortic valve is tricuspid. Aortic valve regurgitation is not visualized. No aortic stenosis is present. Pulmonic Valve: The pulmonic valve was normal in structure. Pulmonic valve regurgitation is not visualized. No evidence of pulmonic stenosis. Aorta: Aortic dilatation noted. There is mild dilatation of the aortic root, measuring 43 mm. IAS/Shunts: The atrial septum is grossly normal.  LEFT VENTRICLE PLAX 2D LVIDd:         4.90 cm   Diastology LVIDs:         3.60 cm   LV e' medial:    7.51 cm/s LV PW:         1.50 cm   LV E/e' medial:  7.8 LV IVS:        1.10 cm   LV  e' lateral:   5.33 cm/s LVOT diam:     2.60 cm   LV E/e' lateral: 11.0 LV SV:         68 LV SV Index:   25 LVOT Area:     5.31 cm  RIGHT VENTRICLE RV S prime:     18.80 cm/s TAPSE (M-mode): 1.0 cm LEFT ATRIUM         Index LA diam:    3.10 cm 1.15 cm/m  AORTIC VALVE LVOT Vmax:   66.00 cm/s LVOT Vmean:  48.000 cm/s LVOT VTI:    0.129 m  AORTA Ao Root diam: 4.30 cm Ao Asc diam:  3.50 cm MITRAL VALVE MV Area (PHT): 4.17 cm    SHUNTS MV Decel Time: 182 msec    Systemic VTI:  0.13 m MR Peak grad: 1.9 mmHg     Systemic Diam: 2.60 cm MR Vmax:      69.50 cm/s MV E velocity: 58.70 cm/s MV A velocity: 54.80 cm/s MV E/A ratio:  1.07 Riley Lam MD Electronically signed by Riley Lam MD Signature Date/Time: 11/29/2022/4:37:22 PM    Final     Vitals:   11/29/22 2346 11/30/22 0410 11/30/22 0738 11/30/22 1145  BP: 122/68 115/73 127/75 128/76  Pulse: 84 77 75 76  Resp: 19 18 17    Temp: 98.4 F (36.9 C) 98.5 F (36.9 C) 98.9 F (37.2 C) 98.7 F (37.1 C)  TempSrc: Oral Oral Oral Oral  SpO2: 93% (!) 89% 94% 94%  Weight:       Height:         PHYSICAL EXAM General:  Alert, morbidly obese middle-aged African-American male patient in no acute distress, laying in bed Psych:  Mood and affect appropriate for situation CV: Regular rate and rhythm on monitor Respiratory:  Regular, unlabored respirations on room air GI: Abdomen soft and nontender   NEURO:  Mental Status: AA&Ox3, patient is able to give clear and coherent history Speech/Language: speech is without dysarthria or aphasia.  Naming, repetition, fluency, and comprehension intact.  Cranial Nerves:  II: PERRL. Visual fields full.  III, IV, VI: EOMI. Eyelids elevate symmetrically.   V: Sensation is intact to light touch and symmetrical to face.  VII: Face is symmetrical resting and smiling VIII: hearing intact to voice. IX, X: Palate elevates symmetrically. Phonation is normal.  GM:[id number] shrug 5/5. XII: tongue is midline without fasciculations. Motor: 5/5 strength to all muscle groups tested.  Tone: is normal and bulk is normal Sensation- Intact to light touch bilaterally. Extinction absent to light touch to DSS.   Coordination: FTN intact bilaterally Gait: unchanged, notes room spinning on right head movement, see PT note for further details.  Overall: Nonfocal, continued dizziness at rest and ataxia on ambulation. See PT note for further details.   ASSESSMENT/PLAN  Likely vestibular neuritis, less suspicion for small ischemic stroke of posterior circulation in the setting of repeat negative CT head  Repeat CT negative. Restarted home Eliquis 5 BID. Per PT vestibular workup: DDx: vestibular neuritis >>> small posterior circulation stroke. Will D/C on DAPT aspirin/plavix for 3 weeks then aspirin indefinitely. Recommended follow-up with [facility] neuro vestibular rehab. Stroke team will sign off at this time.  Code Stroke CT head: No acute abnormality. ASPECTS 10.  CTA head & neck: No emergent LVO or significant stenosis. MRI: Unable to  perform Lower extremity venous ultrasound: WNL 2D Echo: LVEF: 40-45%, unchanged from previous. Left ventricular global hypokinesis. Severe asymmetric left ventricular hypertrophy of the septal segment.  LDL 48 HgbA1c 9.7 VTE prophylaxis -none, we will restart home Eliquis 5 twice daily Eliquis (apixaban) daily prior to admission, now on no anticoagulation Therapy recommendations: Outpatient neuro vestibular rehab Disposition: To be determined  DVT and PE (01/2022) Home Meds:  Continue telemetry monitoring Began anticoagulation with home Eliquis 5 mg twice daily HFmrEF Home meds: Coreg 3.125 twice daily, held now ECG as above  Hyperlipidemia Home meds: Lipitor 40, resumed in hospital LDL 48, goal < 70 Continue statin at discharge  Diabetes type II Uncontrolled Home meds: 60 units glargine nightly, prandial Humalog 3 times daily HgbA1c 9.7, goal < 7.0 CBGs, (136-215) Sensitive SSI with HS correction Recommend close follow-up with PCP for better DM control  Other Stroke Risk Factors Morbid obesity, Body mass index is 49.38 kg/m., BMI >/= 30 associated with increased stroke risk, recommend weight loss, diet and exercise as appropriate  Coronary artery disease Congestive heart failure  Hospital day # 0  I have personally obtained history,examined this patient, reviewed notes, independently viewed imaging studies, participated in medical decision making and plan of care.ROS completed by me personally and pertinent positives fully documented  I have made any additions or clarifications directly to the above note. Agree with note above.  Continue ongoing physical therapy eval and vestibular stabilization exercises.  Continue Eliquis for stroke prevention.  Stroke team will sign off.  Follow-up as an outpatient with vestibular rehab.  Delia Heady, MD Medical Director [facility] Pager: [phone] 11/30/2022 3:40 PM   To contact Stroke Continuity provider, please  refer to [email or web address]. After hours, contact General Neurology

## 2022-11-30 NOTE — Evaluation (Signed)
Occupational Therapy Evaluation Patient Details Name: Chad Rivas MRN: [id number] DOB: 15-Sep-1965 Today's Date: 11/30/2022   History of Present Illness Pt is a 57 y/o M admitted on 11/28/22 after presenting with c/o acute onset of dizziness, N&V. Head CT was negative for acute issues, pt unable to receive MRI. PMH: IDDM, DVT & PE on eliquis, OSA, chronic HFmrEF, sleep apnea   Clinical Impression   PTA, pt lives with spouse, typically completely Independent with all ADLs, IADLs and mobility. 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 Pt presents now with primary limiting factor being dizziness with mobility and minor R knee pain. Focused on gaze stabilization techniques with movement and noted improvements from yesterday's therapy session. Pt able to stand and take some steps using RW with CGA. Pt requires Min A for LB ADLs due to dizziness with bending and decreased flexibility. Educated on Statistician with daily routine at DC. Anticipate no OT needs at DC pending resolution of dizziness to allow further mobility.       If plan is discharge home, recommend the following: A little help with walking and/or transfers;A little help with bathing/dressing/bathroom;Assistance with cooking/housework;Assist for transportation;Help with stairs or ramp for entrance    Functional Status Assessment  Patient has had a recent decline in their functional status and demonstrates the ability to make significant improvements in function in a reasonable and predictable amount of time.  Equipment Recommendations  Other (comment) (may need bari RW (says he has one in storage; need to confirm it is a bari RW))    Recommendations for Other Services       Precautions / Restrictions Precautions Precautions: Fall Restrictions Weight Bearing Restrictions: No      Mobility Bed Mobility Overal bed mobility: Modified Independent Bed Mobility: Supine to Sit, Sit to Supine            General bed mobility comments: cues for slow movement and gaze stabilization    Transfers Overall transfer level: Needs assistance Equipment used: Rolling [employer] (2 wheels) Transfers: Sit to/from Stand Sit to Stand: Contact guard assist           General transfer comment: increased effort, opted for RW (but will need bari RW for further mob)      Balance Overall balance assessment: Needs assistance Sitting-balance support: Feet supported Sitting balance-Leahy Scale: Fair     Standing balance support: Bilateral upper extremity supported, During functional activity Standing balance-Leahy Scale: Poor                             ADL either performed or assessed with clinical judgement   ADL Overall ADL's : Needs assistance/impaired Eating/Feeding: Independent   Grooming: Contact guard assist;Standing   Upper Body Bathing: Set up;Sitting   Lower Body Bathing: Minimal assistance;Sitting/lateral leans;Sit to/from stand   Upper Body Dressing : Set up   Lower Body Dressing: Minimal assistance Lower Body Dressing Details (indicate cue type and reason): assist to don socks. pt typically bends to feet for this at home but unable to do so due to dizziness today. educated on alternative positioning for this (unable to cross LE) or having assist at home Toilet Transfer: Contact guard assist;Stand-pivot;BSC/3in1;Rolling [place] (2 wheels)   Toileting- Clothing Manipulation and Hygiene: Minimal assistance;Sitting/lateral lean;Sit to/from stand         General ADL Comments: Focus on gaze stabilization with transitional movements with improving activity tolerance.  Educated on Statistician with LB ADLs, stair mgmt and tub transfers at home     Vision Baseline Vision/History: 1 Wears glasses Ability to See in Adequate Light: 0 Adequate Patient Visual Report: No change from baseline Vision Assessment?: No apparent visual deficits     Perception         Praxis          Pertinent Vitals/Pain Pain Assessment Pain Assessment: Faces Faces Pain Scale: Hurts a little bit Pain Location: R knee Pain Descriptors / Indicators: Grimacing, Guarding Pain Intervention(s): Ice applied     Extremity/Trunk Assessment Upper Extremity Assessment Upper Extremity Assessment: Overall WFL for tasks assessed;Right hand dominant   Lower Extremity Assessment Lower Extremity Assessment: Defer to PT evaluation   Cervical / Trunk Assessment Cervical / Trunk Assessment: Normal   Communication Communication Communication: No apparent difficulties   Cognition Arousal: Alert Behavior During Therapy: WFL for tasks assessed/performed Overall Cognitive Status: Within Functional Limits for tasks assessed                                       General Comments  Wife at bedside    Exercises     Shoulder Instructions      Home Living Family/patient expects to be discharged to:: Private residence Living Arrangements: Spouse/significant other;Children Available Help at Discharge: Family Type of Home: House Home Access: Stairs to enter [employer] of Steps: 3-4 with L rail Entrance Stairs-Rails: Left Home Layout: One level     Bathroom Shower/Tub: Chief Strategy Officer: Standard     Home Equipment: Cane - single point;Shower seat          Prior Functioning/Environment Prior Level of Function : Independent/Modified Independent;Working/employed;Driving             Mobility Comments: denies falls in the past 6 months 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        OT Problem List: Impaired balance (sitting and/or standing);Decreased activity tolerance;Decreased knowledge of use of DME or AE      OT Treatment/Interventions: Self-care/ADL training;Therapeutic exercise;Energy conservation;DME and/or AE instruction;Therapeutic activities;Patient/family education;Balance training    OT  Goals(Current goals can be found in the care plan section) Acute Rehab OT Goals Patient Stated Goal: resolve dizziness OT Goal Formulation: With patient/family Time For Goal Achievement: 12/14/22 Potential to Achieve Goals: Good  OT Frequency: Min 1X/week    Co-evaluation              AM-PAC OT "6 Clicks" Daily Activity     Outcome Measure Help from another person eating meals?: None Help from another person taking care of personal grooming?: A Little Help from another person toileting, which includes using toliet, bedpan, or urinal?: A Little Help from another person bathing (including washing, rinsing, drying)?: A Little Help from another person to put on and taking off regular upper body clothing?: A Little Help from another person to put on and taking off regular lower body clothing?: A Little 6 Click Score: 19   End of Session Equipment Utilized During Treatment: Rolling Crocker (2 wheels) Nurse Communication: Mobility status  Activity Tolerance: Patient tolerated treatment well Patient left: in bed;with call bell/phone within reach;with bed alarm set;with family/visitor present  OT Visit Diagnosis: Dizziness and giddiness (R42)                Time: 9528-4132 OT Time Calculation (min):  26 min Charges:  OT General Charges $OT Visit: 1 Visit OT Evaluation $OT Eval Low Complexity: 1 Low OT Treatments $Therapeutic Activity: 8-22 mins  Bradd Canary, OTR/L Acute Rehab Services Office: [phone]   Lorre Munroe 11/30/2022, 9:05 AM

## 2022-11-30 NOTE — Plan of Care (Signed)
  Problem: Coping: Goal: Ability to adjust to condition or change in health will improve Outcome: Progressing   Problem: Metabolic: Goal: Ability to maintain appropriate glucose levels will improve Outcome: Progressing   Problem: Education: Goal: Knowledge of disease or condition will improve Outcome: Progressing   Problem: Ischemic Stroke/TIA Tissue Perfusion: Goal: Complications of ischemic stroke/TIA will be minimized Outcome: Progressing   Problem: Safety: Goal: Ability to remain free from injury will improve Outcome: Progressing

## 2022-11-30 NOTE — Plan of Care (Signed)
  Problem: Fluid Volume: Goal: Hemodynamic stability will improve Outcome: Progressing   Problem: Fluid Volume: Goal: Ability to maintain a balanced intake and output will improve Outcome: Progressing   Problem: Nutritional: Goal: Maintenance of adequate nutrition will improve Outcome: Progressing   Problem: Skin Integrity: Goal: Risk for impaired skin integrity will decrease Outcome: Progressing   Problem: Ischemic Stroke/TIA Tissue Perfusion: Goal: Complications of ischemic stroke/TIA will be minimized Outcome: Progressing   Problem: Coping: Goal: Will verbalize positive feelings about self Outcome: Progressing

## 2022-11-30 NOTE — Progress Notes (Addendum)
[facility]  PROGRESS NOTE  Chad Rivas YTK:[id number] DOB: 1965-12-02 DOA: 11/28/2022 PCP: Emilio Aspen, MD   Brief HPI:    57 y.o. male with medical history significant for insulin-dependent diabetes mellitus, history of DVT and PE on Eliquis, OSA, chronic HFmrEF, and BMI 49. They presented with acute onset of dizziness, nausea, and vomiting.   Patient was in his usual state when he developed acute onset of dizziness, nausea, vomiting, and difficulty with balance.  Neurology evaluated the patient in the emergency department and recommended medical admission for further evaluation and management of suspected posterior circulation stroke.   Unable to undergo brain MRI due to weight limits.    Assessment/Plan:   Vertigo -Initial CT head was negative, repeat CT head also negative -Likely vestibular neuritis, as per PT evaluation -Neurology has restarted Eliquis 5 mg p.o. twice daily -As for PT differentials includes: Posterior circulation stroke versus vestibular neuritis, neurology recommends to discharge on aspirin and Plavix for 3 weeks and then aspirin indefinitely -Outpatient neuro vascular rehab    Chronic HFmrEF-  Appears compensated. Repeat echo 2D Echo: LVEF: 40-45%, unchanged from previous. Left ventricular global hypokinesis. Severe asymmetric left ventricular hypertrophy of the septal segment.  - restart home Coreg   Insulin-dependent DM - A1c was 11.6% in November 2023  - Check CBGs and use long- and short-acting insulin    Hx of recurrent DVT and PE  - LE dopplers negative for DVT - continue eliquis    Chest pain  - Patient developed chest discomfort on arrival to ED  - Initial troponin is normal, no acute ischemic features noted on EKG, and no acute findings are noted on CTA chest/abd/pelvis - Continue cardiac monitoring, repeat troponin, follow-up echo findings    OSA  - CPAP while sleeping     Medications     apixaban  5 mg Oral BID    atorvastatin  40 mg Oral Daily   insulin aspart  0-5 Units Subcutaneous QHS   insulin aspart  0-9 Units Subcutaneous TID WC   insulin glargine-yfgn  40 Units Subcutaneous QHS   sodium chloride flush  3 mL Intravenous Q12H     Data Reviewed:   CBG:  Recent Labs  Lab 11/29/22 1630 11/29/22 2114 11/30/22 0614 11/30/22 1137 11/30/22 1550  GLUCAP 159* 161* 190* 124* 147*    SpO2: 93 % O2 Flow Rate (L/min): 3 L/min    Vitals:   11/30/22 0410 11/30/22 0738 11/30/22 1145 11/30/22 1500  BP: 115/73 127/75 128/76 118/78  Pulse: 77 75 76 77  Resp: 18 17    Temp: 98.5 F (36.9 C) 98.9 F (37.2 C) 98.7 F (37.1 C) 98.2 F (36.8 C)  TempSrc: Oral Oral Oral Oral  SpO2: (!) 89% 94% 94% 93%  Weight:      Height:          Data Reviewed:  Basic Metabolic Panel: Recent Labs  Lab 11/28/22 1849 11/28/22 1915 11/29/22 0255 11/30/22 0747  NA 136 138 136 137  K 3.9 4.1 4.3 3.7  CL 97* 99 100 98  CO2 26  --  26 30  GLUCOSE 218* 218* 158* 184*  BUN 10 14 10 11   CREATININE 1.04 1.00 1.03 1.03  CALCIUM 8.5*  --  8.7* 8.5*    CBC: Recent Labs  Lab 11/28/22 1849 11/28/22 1915 11/29/22 0255 11/30/22 0747  WBC 14.8*  --  13.6* 8.4  NEUTROABS 12.2*  --   --   --  HGB 12.4* 14.6 11.8* 11.7*  HCT 40.8 43.0 40.5 39.9  MCV 79.8*  --  78.9* 79.3*  PLT 344  --  360 353    LFT Recent Labs  Lab 11/29/22 0255  AST 27  ALT 16  ALKPHOS 64  BILITOT 1.0  PROT 7.5  ALBUMIN 3.0*     Antibiotics: Anti-infectives (From admission, onward)    None        DVT prophylaxis: SCDs  Code Status: Full code  Family Communication: No family at bedside   CONSULTS    Subjective   Continues to have dizziness   Objective    Physical Examination:   General-appears in no acute distress Heart-S1-S2, regular, no murmur auscultated Lungs-clear to auscultation bilaterally, no wheezing or crackles auscultated Abdomen-soft, nontender, no organomegaly Extremities-no  edema in the lower extremities Neuro-alert, oriented x3, no focal deficit noted   Status is: Inpatient:             Meredeth Ide   [facility] If 7PM-7AM, please contact night-coverage at [email or web address], Office  [phone]   11/30/2022, 7:02 PM  LOS: 0 days

## 2022-12-01 DIAGNOSIS — R42 Dizziness and giddiness: Secondary | ICD-10-CM | POA: Diagnosis not present

## 2022-12-01 DIAGNOSIS — Z86711 Personal history of pulmonary embolism: Secondary | ICD-10-CM | POA: Diagnosis not present

## 2022-12-01 DIAGNOSIS — I5022 Chronic systolic (congestive) heart failure: Secondary | ICD-10-CM | POA: Diagnosis not present

## 2022-12-01 DIAGNOSIS — E119 Type 2 diabetes mellitus without complications: Secondary | ICD-10-CM | POA: Diagnosis not present

## 2022-12-01 LAB — GLUCOSE, CAPILLARY
Glucose-Capillary: 161 mg/dL — ABNORMAL HIGH (ref 70–99)
Glucose-Capillary: 131 mg/dL — ABNORMAL HIGH (ref 70–99)

## 2022-12-01 MED ORDER — APIXABAN 5 MG PO TABS: 5.0000 mg | ORAL_TABLET | Freq: Two times a day (BID) | ORAL | 3 refills | Status: AC

## 2022-12-01 MED ORDER — PREDNISONE 20 MG PO TABS
40.0000 mg | ORAL_TABLET | Freq: Every day | ORAL | Status: DC
  Administered 2022-12-01: 40 mg via ORAL
  Filled 2022-12-01: qty 2

## 2022-12-01 MED ORDER — PREDNISONE 10 MG PO TABS: ORAL_TABLET | ORAL | 0 refills | Status: DC

## 2022-12-01 NOTE — Progress Notes (Signed)
Physical Therapy Treatment Patient Details Name: Chad Rivas MRN: [id number] DOB: 12-21-1965 Today's Date: 12/01/2022   History of Present Illness Pt is a 57 y/o M admitted on 11/28/22 after presenting with c/o acute onset of dizziness, N&V. Head CT was negative for acute issues, pt unable to receive MRI. PMH: IDDM, DVT & PE on eliquis, OSA, chronic HFmrEF, sleep apnea    PT Comments  Reports significant improvement in symptoms. Nystagmus noted be very subtle with Lt gaze (horizontal Lt beating,) suppressed with Rt gaze. Feels "off" rather than any actual vertigo which he reports was absent when he sat up this morning from bed. Understands x1 view and brandt-daroff exercises and will follow-up with OPPT neuro vestibular rehab. Gait with rollator, limited primarily by Rt knee pain which he reports comes and goes as a chronic issue. Gait much more stable without overt sway or drift today, good coordination of LEs but antalgic with WB on Rt. Has Rw and SPC at home and plans to use as needed. Will follow and progress until d/c. Patient will continue to benefit from skilled physical therapy services to further improve independence with functional mobility.     If plan is discharge home, recommend the following: A little help with walking and/or transfers;A little help with bathing/dressing/bathroom;Assistance with cooking/housework;Assist for transportation;Help with stairs or ramp for entrance   Can travel by private vehicle        Equipment Recommendations  None recommended by PT    Recommendations for Other Services       Precautions / Restrictions Precautions Precautions: Fall Restrictions Weight Bearing Restrictions: No     Mobility  Bed Mobility               General bed mobility comments: in recliner    Transfers Overall transfer level: Modified independent Equipment used: Rollator (4 wheels) Transfers: Sit to/from Stand             General transfer comment: No  assist needed, slow to rise and lower due to Rt knee pain. No overt sway noted today. rollator for light support.    Ambulation/Gait Ambulation/Gait assistance: Supervision Gait Distance (Feet): 100 Feet Assistive device: Rollator (4 wheels) Gait Pattern/deviations: Step-through pattern, Antalgic, Decreased stance time - right Gait velocity: decr Gait velocity interpretation: <1.31 ft/sec, indicative of household ambulator   General Gait Details: Antalgic, slow but without overt sway today. Able to turn head slowly without causing LOB. Rollator for light support, pt prefers UE support still at this time, has SPC and RW at home he plans to use at d/c. Mostly limited by Rt knee pain which he reports comes and goes, planning to get steroid to help symptoms from ortho.   Stairs             Wheelchair Mobility     Tilt Bed    Modified Rankin (Stroke Patients Only)       Balance Overall balance assessment: Needs assistance Sitting-balance support: Feet supported Sitting balance-Leahy Scale: Good     Standing balance support: During functional activity, No upper extremity supported Standing balance-Leahy Scale: Fair Standing balance comment: more stable with UE use.                            Cognition Arousal: Alert Behavior During Therapy: WFL for tasks assessed/performed Overall Cognitive Status: Within Functional Limits for tasks assessed  Exercises Other Exercises Other Exercises: Reviewed handout for x1 view and brandt daroff; continue with use until OP follow-up.    General Comments General comments (skin integrity, edema, etc.): Very subtle Lt beat horizontal nystagmus with Lt gaze, suppressed with Rt gaze. States he did not need to take meclizine today.      Pertinent Vitals/Pain Pain Assessment Pain Assessment: Faces Faces Pain Scale: Hurts little more Pain Location: R knee Pain  Descriptors / Indicators: Grimacing, Guarding Pain Intervention(s): Monitored during session, Repositioned, Limited activity within patient's tolerance    Home Living                          Prior Function            PT Goals (current goals can now be found in the care plan section) Acute Rehab PT Goals Patient Stated Goal: decreased dizziness, feel better PT Goal Formulation: With patient Time For Goal Achievement: 12/13/22 Potential to Achieve Goals: Good Progress towards PT goals: Progressing toward goals    Frequency    Min 1X/week      PT Plan      Co-evaluation              AM-PAC PT "6 Clicks" Mobility   Outcome Measure  Help needed turning from your back to your side while in a flat bed without using bedrails?: None Help needed moving from lying on your back to sitting on the side of a flat bed without using bedrails?: None Help needed moving to and from a bed to a chair (including a wheelchair)?: None Help needed standing up from a chair using your arms (e.g., wheelchair or bedside chair)?: None Help needed to walk in hospital room?: A Little Help needed climbing 3-5 steps with a railing? : A Little 6 Click Score: 22    End of Session Equipment Utilized During Treatment: Gait belt Activity Tolerance: Patient tolerated treatment well Patient left: in chair;with call bell/phone within reach   PT Visit Diagnosis: Unsteadiness on feet (R26.81);Difficulty in walking, not elsewhere classified (R26.2);Dizziness and giddiness (R42)     Time: 5621-3086 PT Time Calculation (min) (ACUTE ONLY): 11 min  Charges:    $Gait Training: 8-22 mins PT General Charges $$ ACUTE PT VISIT: 1 Visit                     Kathlyn [place], PT, DPT [facility]  Rehabilitation Services Physical Therapist Office: [phone] Website: [email or web address]    Berton Mount 12/01/2022, 11:36 AM

## 2022-12-01 NOTE — Discharge Summary (Signed)
Physician Discharge Summary   Patient: Chad Rivas MRN: [id number] DOB: September 02, 1965  Admit date:     11/28/2022  Discharge date: 12/01/22  Discharge Physician: Meredeth Ide   PCP: Emilio Aspen, MD   Recommendations at discharge:   Outpatient vestibular PT  Discharge Diagnoses: Principal Problem:   Vertigo Active Problems:   DM2 (diabetes mellitus, type 2) (HCC)   History of pulmonary embolism   OSA (obstructive sleep apnea)   Heart failure with mildly reduced ejection fraction (HFmrEF) (HCC)  Resolved Problems:   * No resolved hospital problems. *  Hospital Course:   57 y.o. male with medical history significant for insulin-dependent diabetes mellitus, history of DVT and PE on Eliquis, OSA, chronic HFmrEF, and BMI 49. They presented with acute onset of dizziness, nausea, and vomiting.   Patient was in his usual state when he developed acute onset of dizziness, nausea, vomiting, and difficulty with balance.  Neurology evaluated the patient in the emergency department and recommended medical admission for further evaluation and management of suspected posterior circulation stroke.   Unable to undergo brain MRI due to weight limits.  Assessment and Plan:  Vertigo -Initial CT head was negative, repeat CT head also negative -Likely vestibular neuritis, as per PT evaluation -Neurology has restarted Eliquis 5 mg p.o. twice daily -As for PT differentials includes: Posterior circulation stroke versus vestibular neuritis, neurology recommends to discharge on Eliquis 5 mg po bid -Outpatient neuro vascular rehab     Chronic HFmrEF-  Appears compensated. Repeat echo 2D Echo: LVEF: 40-45%, unchanged from previous. Left ventricular global hypokinesis. Severe asymmetric left ventricular hypertrophy of the septal segment.  Unchanged from previous echo from 2022. - restart home Coreg   Insulin-dependent DM - A1c was 11.6% in November 2023  - Continue home regimen   Hx of  recurrent DVT and PE  - LE dopplers negative for DVT - continue eliquis    Chest pain  - Patient developed chest discomfort on arrival to ED  - Initial troponin is normal, no acute ischemic features noted on EKG, and no acute findings are noted on CTA chest/abd/pelvis  OSA  - CPAP while sleeping   Gout -Patient complains of pain in the right knee -Has a history of gout -Will start prednisone taper for 4 days      Consultants: Neurology Procedures performed: Echocardiogram Disposition: Home Diet recommendation:  Discharge Diet Orders (From admission, onward)     Start     Ordered   12/01/22 0000  Diet - low sodium heart healthy        12/01/22 1057           Cardiac diet DISCHARGE MEDICATION: Allergies as of 12/01/2022       Reactions   Penicillins Anaphylaxis, Other (See Comments)   Has patient had a PCN reaction causing immediate rash, facial/tongue/throat swelling, SOB or lightheadedness with hypotension: Y Has patient had a PCN reaction causing severe rash involving mucus membranes or skin necrosis: Y Has patient had a PCN reaction that required hospitalization: Y Has patient had a PCN reaction occurring within the last 10 years: N If all of the above answers are "NO", then may proceed with Cephalosporin use. Pt states he had hives that required Benadryl treatment    Bactrim [sulfamethoxazole-trimethoprim] Hives   Invokana [canagliflozin] Other (See Comments)   Hx of Fournier's gangrene    Penicillin G Procaine Hives   Losartan Other (See Comments)   Cause angioedema  Physician Discharge Summary   Patient: Chad Rivas MRN: [id number] DOB: September 02, 1965  Admit date:     11/28/2022  Discharge date: 12/01/22  Discharge Physician: Meredeth Ide   PCP: Emilio Aspen, MD   Recommendations at discharge:   Outpatient vestibular PT  Discharge Diagnoses: Principal Problem:   Vertigo Active Problems:   DM2 (diabetes mellitus, type 2) (HCC)   History of pulmonary embolism   OSA (obstructive sleep apnea)   Heart failure with mildly reduced ejection fraction (HFmrEF) (HCC)  Resolved Problems:   * No resolved hospital problems. *  Hospital Course:   57 y.o. male with medical history significant for insulin-dependent diabetes mellitus, history of DVT and PE on Eliquis, OSA, chronic HFmrEF, and BMI 49. They presented with acute onset of dizziness, nausea, and vomiting.   Patient was in his usual state when he developed acute onset of dizziness, nausea, vomiting, and difficulty with balance.  Neurology evaluated the patient in the emergency department and recommended medical admission for further evaluation and management of suspected posterior circulation stroke.   Unable to undergo brain MRI due to weight limits.  Assessment and Plan:  Vertigo -Initial CT head was negative, repeat CT head also negative -Likely vestibular neuritis, as per PT evaluation -Neurology has restarted Eliquis 5 mg p.o. twice daily -As for PT differentials includes: Posterior circulation stroke versus vestibular neuritis, neurology recommends to discharge on Eliquis 5 mg po bid -Outpatient neuro vascular rehab     Chronic HFmrEF-  Appears compensated. Repeat echo 2D Echo: LVEF: 40-45%, unchanged from previous. Left ventricular global hypokinesis. Severe asymmetric left ventricular hypertrophy of the septal segment.  Unchanged from previous echo from 2022. - restart home Coreg   Insulin-dependent DM - A1c was 11.6% in November 2023  - Continue home regimen   Hx of  recurrent DVT and PE  - LE dopplers negative for DVT - continue eliquis    Chest pain  - Patient developed chest discomfort on arrival to ED  - Initial troponin is normal, no acute ischemic features noted on EKG, and no acute findings are noted on CTA chest/abd/pelvis  OSA  - CPAP while sleeping   Gout -Patient complains of pain in the right knee -Has a history of gout -Will start prednisone taper for 4 days      Consultants: Neurology Procedures performed: Echocardiogram Disposition: Home Diet recommendation:  Discharge Diet Orders (From admission, onward)     Start     Ordered   12/01/22 0000  Diet - low sodium heart healthy        12/01/22 1057           Cardiac diet DISCHARGE MEDICATION: Allergies as of 12/01/2022       Reactions   Penicillins Anaphylaxis, Other (See Comments)   Has patient had a PCN reaction causing immediate rash, facial/tongue/throat swelling, SOB or lightheadedness with hypotension: Y Has patient had a PCN reaction causing severe rash involving mucus membranes or skin necrosis: Y Has patient had a PCN reaction that required hospitalization: Y Has patient had a PCN reaction occurring within the last 10 years: N If all of the above answers are "NO", then may proceed with Cephalosporin use. Pt states he had hives that required Benadryl treatment    Bactrim [sulfamethoxazole-trimethoprim] Hives   Invokana [canagliflozin] Other (See Comments)   Hx of Fournier's gangrene    Penicillin G Procaine Hives   Losartan Other (See Comments)   Cause angioedema  stenosis is present.  5. Aortic dilatation noted. There is mild dilatation of the aortic root, measuring 43 mm. Comparison(s): RV hypertrophy noted; RV has further dilated. FINDINGS  Left Ventricle: Left ventricular ejection fraction, by estimation, is 40 to 45%. The left ventricle has mildly decreased function. The left ventricle demonstrates global hypokinesis. Definity contrast agent was given IV to delineate the left ventricular  endocardial borders. The left ventricular internal cavity size was normal in size. There is severe asymmetric left ventricular hypertrophy of the septal segment. The interventricular septum is flattened in systole and diastole, consistent with right ventricular pressure and volume overload. Left ventricular diastolic parameters are consistent with Grade I diastolic dysfunction (impaired relaxation). Right Ventricle: The right ventricular size is severely enlarged. Moderately increased right ventricular wall thickness. Right ventricular systolic function is normal. Left Atrium: Left atrial size was normal in size. Right Atrium: Right atrial size was normal in size. Pericardium: There is no evidence of pericardial effusion. Mitral Valve: The mitral valve is normal in structure. No evidence of mitral valve regurgitation. Tricuspid Valve: The tricuspid valve is normal in structure. Tricuspid valve regurgitation is not demonstrated. No evidence of tricuspid stenosis. Aortic Valve: The aortic valve is  tricuspid. Aortic valve regurgitation is not visualized. No aortic stenosis is present. Pulmonic Valve: The pulmonic valve was normal in structure. Pulmonic valve regurgitation is not visualized. No evidence of pulmonic stenosis. Aorta: Aortic dilatation noted. There is mild dilatation of the aortic root, measuring 43 mm. IAS/Shunts: The atrial septum is grossly normal.  LEFT VENTRICLE PLAX 2D LVIDd:         4.90 cm   Diastology LVIDs:         3.60 cm   LV e' medial:    7.51 cm/s LV PW:         1.50 cm   LV E/e' medial:  7.8 LV IVS:        1.10 cm   LV e' lateral:   5.33 cm/s LVOT diam:     2.60 cm   LV E/e' lateral: 11.0 LV SV:         68 LV SV Index:   25 LVOT Area:     5.31 cm  RIGHT VENTRICLE RV S prime:     18.80 cm/s TAPSE (M-mode): 1.0 cm LEFT ATRIUM         Index LA diam:    3.10 cm 1.15 cm/m  AORTIC VALVE LVOT Vmax:   66.00 cm/s LVOT Vmean:  48.000 cm/s LVOT VTI:    0.129 m  AORTA Ao Root diam: 4.30 cm Ao Asc diam:  3.50 cm MITRAL VALVE MV Area (PHT): 4.17 cm    SHUNTS MV Decel Time: 182 msec    Systemic VTI:  0.13 m MR Peak grad: 1.9 mmHg     Systemic Diam: 2.60 cm MR Vmax:      69.50 cm/s MV E velocity: 58.70 cm/s MV A velocity: 54.80 cm/s MV E/A ratio:  1.07 Riley Lam MD Electronically signed by Riley Lam MD Signature Date/Time: 11/29/2022/4:37:22 PM    Final    VAS [place] LOWER EXTREMITY VENOUS (DVT)  Result Date: 11/29/2022  Lower Venous DVT Study Patient Name:  ZOHAIB CRAFT  Date of Exam:   11/29/2022 Medical Rec #: [id number]       Accession #:    [id number] Date of Birth: 06/12/1965        Patient Gender: M Patient Age:   57 years Exam Location:  [facility]  stenosis is present.  5. Aortic dilatation noted. There is mild dilatation of the aortic root, measuring 43 mm. Comparison(s): RV hypertrophy noted; RV has further dilated. FINDINGS  Left Ventricle: Left ventricular ejection fraction, by estimation, is 40 to 45%. The left ventricle has mildly decreased function. The left ventricle demonstrates global hypokinesis. Definity contrast agent was given IV to delineate the left ventricular  endocardial borders. The left ventricular internal cavity size was normal in size. There is severe asymmetric left ventricular hypertrophy of the septal segment. The interventricular septum is flattened in systole and diastole, consistent with right ventricular pressure and volume overload. Left ventricular diastolic parameters are consistent with Grade I diastolic dysfunction (impaired relaxation). Right Ventricle: The right ventricular size is severely enlarged. Moderately increased right ventricular wall thickness. Right ventricular systolic function is normal. Left Atrium: Left atrial size was normal in size. Right Atrium: Right atrial size was normal in size. Pericardium: There is no evidence of pericardial effusion. Mitral Valve: The mitral valve is normal in structure. No evidence of mitral valve regurgitation. Tricuspid Valve: The tricuspid valve is normal in structure. Tricuspid valve regurgitation is not demonstrated. No evidence of tricuspid stenosis. Aortic Valve: The aortic valve is  tricuspid. Aortic valve regurgitation is not visualized. No aortic stenosis is present. Pulmonic Valve: The pulmonic valve was normal in structure. Pulmonic valve regurgitation is not visualized. No evidence of pulmonic stenosis. Aorta: Aortic dilatation noted. There is mild dilatation of the aortic root, measuring 43 mm. IAS/Shunts: The atrial septum is grossly normal.  LEFT VENTRICLE PLAX 2D LVIDd:         4.90 cm   Diastology LVIDs:         3.60 cm   LV e' medial:    7.51 cm/s LV PW:         1.50 cm   LV E/e' medial:  7.8 LV IVS:        1.10 cm   LV e' lateral:   5.33 cm/s LVOT diam:     2.60 cm   LV E/e' lateral: 11.0 LV SV:         68 LV SV Index:   25 LVOT Area:     5.31 cm  RIGHT VENTRICLE RV S prime:     18.80 cm/s TAPSE (M-mode): 1.0 cm LEFT ATRIUM         Index LA diam:    3.10 cm 1.15 cm/m  AORTIC VALVE LVOT Vmax:   66.00 cm/s LVOT Vmean:  48.000 cm/s LVOT VTI:    0.129 m  AORTA Ao Root diam: 4.30 cm Ao Asc diam:  3.50 cm MITRAL VALVE MV Area (PHT): 4.17 cm    SHUNTS MV Decel Time: 182 msec    Systemic VTI:  0.13 m MR Peak grad: 1.9 mmHg     Systemic Diam: 2.60 cm MR Vmax:      69.50 cm/s MV E velocity: 58.70 cm/s MV A velocity: 54.80 cm/s MV E/A ratio:  1.07 Riley Lam MD Electronically signed by Riley Lam MD Signature Date/Time: 11/29/2022/4:37:22 PM    Final    VAS [place] LOWER EXTREMITY VENOUS (DVT)  Result Date: 11/29/2022  Lower Venous DVT Study Patient Name:  ZOHAIB CRAFT  Date of Exam:   11/29/2022 Medical Rec #: [id number]       Accession #:    [id number] Date of Birth: 06/12/1965        Patient Gender: M Patient Age:   57 years Exam Location:  [facility]  stenosis is present.  5. Aortic dilatation noted. There is mild dilatation of the aortic root, measuring 43 mm. Comparison(s): RV hypertrophy noted; RV has further dilated. FINDINGS  Left Ventricle: Left ventricular ejection fraction, by estimation, is 40 to 45%. The left ventricle has mildly decreased function. The left ventricle demonstrates global hypokinesis. Definity contrast agent was given IV to delineate the left ventricular  endocardial borders. The left ventricular internal cavity size was normal in size. There is severe asymmetric left ventricular hypertrophy of the septal segment. The interventricular septum is flattened in systole and diastole, consistent with right ventricular pressure and volume overload. Left ventricular diastolic parameters are consistent with Grade I diastolic dysfunction (impaired relaxation). Right Ventricle: The right ventricular size is severely enlarged. Moderately increased right ventricular wall thickness. Right ventricular systolic function is normal. Left Atrium: Left atrial size was normal in size. Right Atrium: Right atrial size was normal in size. Pericardium: There is no evidence of pericardial effusion. Mitral Valve: The mitral valve is normal in structure. No evidence of mitral valve regurgitation. Tricuspid Valve: The tricuspid valve is normal in structure. Tricuspid valve regurgitation is not demonstrated. No evidence of tricuspid stenosis. Aortic Valve: The aortic valve is  tricuspid. Aortic valve regurgitation is not visualized. No aortic stenosis is present. Pulmonic Valve: The pulmonic valve was normal in structure. Pulmonic valve regurgitation is not visualized. No evidence of pulmonic stenosis. Aorta: Aortic dilatation noted. There is mild dilatation of the aortic root, measuring 43 mm. IAS/Shunts: The atrial septum is grossly normal.  LEFT VENTRICLE PLAX 2D LVIDd:         4.90 cm   Diastology LVIDs:         3.60 cm   LV e' medial:    7.51 cm/s LV PW:         1.50 cm   LV E/e' medial:  7.8 LV IVS:        1.10 cm   LV e' lateral:   5.33 cm/s LVOT diam:     2.60 cm   LV E/e' lateral: 11.0 LV SV:         68 LV SV Index:   25 LVOT Area:     5.31 cm  RIGHT VENTRICLE RV S prime:     18.80 cm/s TAPSE (M-mode): 1.0 cm LEFT ATRIUM         Index LA diam:    3.10 cm 1.15 cm/m  AORTIC VALVE LVOT Vmax:   66.00 cm/s LVOT Vmean:  48.000 cm/s LVOT VTI:    0.129 m  AORTA Ao Root diam: 4.30 cm Ao Asc diam:  3.50 cm MITRAL VALVE MV Area (PHT): 4.17 cm    SHUNTS MV Decel Time: 182 msec    Systemic VTI:  0.13 m MR Peak grad: 1.9 mmHg     Systemic Diam: 2.60 cm MR Vmax:      69.50 cm/s MV E velocity: 58.70 cm/s MV A velocity: 54.80 cm/s MV E/A ratio:  1.07 Riley Lam MD Electronically signed by Riley Lam MD Signature Date/Time: 11/29/2022/4:37:22 PM    Final    VAS [place] LOWER EXTREMITY VENOUS (DVT)  Result Date: 11/29/2022  Lower Venous DVT Study Patient Name:  ZOHAIB CRAFT  Date of Exam:   11/29/2022 Medical Rec #: [id number]       Accession #:    [id number] Date of Birth: 06/12/1965        Patient Gender: M Patient Age:   57 years Exam Location:  [facility]  Physician Discharge Summary   Patient: Chad Rivas MRN: [id number] DOB: September 02, 1965  Admit date:     11/28/2022  Discharge date: 12/01/22  Discharge Physician: Meredeth Ide   PCP: Emilio Aspen, MD   Recommendations at discharge:   Outpatient vestibular PT  Discharge Diagnoses: Principal Problem:   Vertigo Active Problems:   DM2 (diabetes mellitus, type 2) (HCC)   History of pulmonary embolism   OSA (obstructive sleep apnea)   Heart failure with mildly reduced ejection fraction (HFmrEF) (HCC)  Resolved Problems:   * No resolved hospital problems. *  Hospital Course:   57 y.o. male with medical history significant for insulin-dependent diabetes mellitus, history of DVT and PE on Eliquis, OSA, chronic HFmrEF, and BMI 49. They presented with acute onset of dizziness, nausea, and vomiting.   Patient was in his usual state when he developed acute onset of dizziness, nausea, vomiting, and difficulty with balance.  Neurology evaluated the patient in the emergency department and recommended medical admission for further evaluation and management of suspected posterior circulation stroke.   Unable to undergo brain MRI due to weight limits.  Assessment and Plan:  Vertigo -Initial CT head was negative, repeat CT head also negative -Likely vestibular neuritis, as per PT evaluation -Neurology has restarted Eliquis 5 mg p.o. twice daily -As for PT differentials includes: Posterior circulation stroke versus vestibular neuritis, neurology recommends to discharge on Eliquis 5 mg po bid -Outpatient neuro vascular rehab     Chronic HFmrEF-  Appears compensated. Repeat echo 2D Echo: LVEF: 40-45%, unchanged from previous. Left ventricular global hypokinesis. Severe asymmetric left ventricular hypertrophy of the septal segment.  Unchanged from previous echo from 2022. - restart home Coreg   Insulin-dependent DM - A1c was 11.6% in November 2023  - Continue home regimen   Hx of  recurrent DVT and PE  - LE dopplers negative for DVT - continue eliquis    Chest pain  - Patient developed chest discomfort on arrival to ED  - Initial troponin is normal, no acute ischemic features noted on EKG, and no acute findings are noted on CTA chest/abd/pelvis  OSA  - CPAP while sleeping   Gout -Patient complains of pain in the right knee -Has a history of gout -Will start prednisone taper for 4 days      Consultants: Neurology Procedures performed: Echocardiogram Disposition: Home Diet recommendation:  Discharge Diet Orders (From admission, onward)     Start     Ordered   12/01/22 0000  Diet - low sodium heart healthy        12/01/22 1057           Cardiac diet DISCHARGE MEDICATION: Allergies as of 12/01/2022       Reactions   Penicillins Anaphylaxis, Other (See Comments)   Has patient had a PCN reaction causing immediate rash, facial/tongue/throat swelling, SOB or lightheadedness with hypotension: Y Has patient had a PCN reaction causing severe rash involving mucus membranes or skin necrosis: Y Has patient had a PCN reaction that required hospitalization: Y Has patient had a PCN reaction occurring within the last 10 years: N If all of the above answers are "NO", then may proceed with Cephalosporin use. Pt states he had hives that required Benadryl treatment    Bactrim [sulfamethoxazole-trimethoprim] Hives   Invokana [canagliflozin] Other (See Comments)   Hx of Fournier's gangrene    Penicillin G Procaine Hives   Losartan Other (See Comments)   Cause angioedema  stenosis is present.  5. Aortic dilatation noted. There is mild dilatation of the aortic root, measuring 43 mm. Comparison(s): RV hypertrophy noted; RV has further dilated. FINDINGS  Left Ventricle: Left ventricular ejection fraction, by estimation, is 40 to 45%. The left ventricle has mildly decreased function. The left ventricle demonstrates global hypokinesis. Definity contrast agent was given IV to delineate the left ventricular  endocardial borders. The left ventricular internal cavity size was normal in size. There is severe asymmetric left ventricular hypertrophy of the septal segment. The interventricular septum is flattened in systole and diastole, consistent with right ventricular pressure and volume overload. Left ventricular diastolic parameters are consistent with Grade I diastolic dysfunction (impaired relaxation). Right Ventricle: The right ventricular size is severely enlarged. Moderately increased right ventricular wall thickness. Right ventricular systolic function is normal. Left Atrium: Left atrial size was normal in size. Right Atrium: Right atrial size was normal in size. Pericardium: There is no evidence of pericardial effusion. Mitral Valve: The mitral valve is normal in structure. No evidence of mitral valve regurgitation. Tricuspid Valve: The tricuspid valve is normal in structure. Tricuspid valve regurgitation is not demonstrated. No evidence of tricuspid stenosis. Aortic Valve: The aortic valve is  tricuspid. Aortic valve regurgitation is not visualized. No aortic stenosis is present. Pulmonic Valve: The pulmonic valve was normal in structure. Pulmonic valve regurgitation is not visualized. No evidence of pulmonic stenosis. Aorta: Aortic dilatation noted. There is mild dilatation of the aortic root, measuring 43 mm. IAS/Shunts: The atrial septum is grossly normal.  LEFT VENTRICLE PLAX 2D LVIDd:         4.90 cm   Diastology LVIDs:         3.60 cm   LV e' medial:    7.51 cm/s LV PW:         1.50 cm   LV E/e' medial:  7.8 LV IVS:        1.10 cm   LV e' lateral:   5.33 cm/s LVOT diam:     2.60 cm   LV E/e' lateral: 11.0 LV SV:         68 LV SV Index:   25 LVOT Area:     5.31 cm  RIGHT VENTRICLE RV S prime:     18.80 cm/s TAPSE (M-mode): 1.0 cm LEFT ATRIUM         Index LA diam:    3.10 cm 1.15 cm/m  AORTIC VALVE LVOT Vmax:   66.00 cm/s LVOT Vmean:  48.000 cm/s LVOT VTI:    0.129 m  AORTA Ao Root diam: 4.30 cm Ao Asc diam:  3.50 cm MITRAL VALVE MV Area (PHT): 4.17 cm    SHUNTS MV Decel Time: 182 msec    Systemic VTI:  0.13 m MR Peak grad: 1.9 mmHg     Systemic Diam: 2.60 cm MR Vmax:      69.50 cm/s MV E velocity: 58.70 cm/s MV A velocity: 54.80 cm/s MV E/A ratio:  1.07 Riley Lam MD Electronically signed by Riley Lam MD Signature Date/Time: 11/29/2022/4:37:22 PM    Final    VAS [place] LOWER EXTREMITY VENOUS (DVT)  Result Date: 11/29/2022  Lower Venous DVT Study Patient Name:  ZOHAIB CRAFT  Date of Exam:   11/29/2022 Medical Rec #: [id number]       Accession #:    [id number] Date of Birth: 06/12/1965        Patient Gender: M Patient Age:   57 years Exam Location:  [facility]  stenosis is present.  5. Aortic dilatation noted. There is mild dilatation of the aortic root, measuring 43 mm. Comparison(s): RV hypertrophy noted; RV has further dilated. FINDINGS  Left Ventricle: Left ventricular ejection fraction, by estimation, is 40 to 45%. The left ventricle has mildly decreased function. The left ventricle demonstrates global hypokinesis. Definity contrast agent was given IV to delineate the left ventricular  endocardial borders. The left ventricular internal cavity size was normal in size. There is severe asymmetric left ventricular hypertrophy of the septal segment. The interventricular septum is flattened in systole and diastole, consistent with right ventricular pressure and volume overload. Left ventricular diastolic parameters are consistent with Grade I diastolic dysfunction (impaired relaxation). Right Ventricle: The right ventricular size is severely enlarged. Moderately increased right ventricular wall thickness. Right ventricular systolic function is normal. Left Atrium: Left atrial size was normal in size. Right Atrium: Right atrial size was normal in size. Pericardium: There is no evidence of pericardial effusion. Mitral Valve: The mitral valve is normal in structure. No evidence of mitral valve regurgitation. Tricuspid Valve: The tricuspid valve is normal in structure. Tricuspid valve regurgitation is not demonstrated. No evidence of tricuspid stenosis. Aortic Valve: The aortic valve is  tricuspid. Aortic valve regurgitation is not visualized. No aortic stenosis is present. Pulmonic Valve: The pulmonic valve was normal in structure. Pulmonic valve regurgitation is not visualized. No evidence of pulmonic stenosis. Aorta: Aortic dilatation noted. There is mild dilatation of the aortic root, measuring 43 mm. IAS/Shunts: The atrial septum is grossly normal.  LEFT VENTRICLE PLAX 2D LVIDd:         4.90 cm   Diastology LVIDs:         3.60 cm   LV e' medial:    7.51 cm/s LV PW:         1.50 cm   LV E/e' medial:  7.8 LV IVS:        1.10 cm   LV e' lateral:   5.33 cm/s LVOT diam:     2.60 cm   LV E/e' lateral: 11.0 LV SV:         68 LV SV Index:   25 LVOT Area:     5.31 cm  RIGHT VENTRICLE RV S prime:     18.80 cm/s TAPSE (M-mode): 1.0 cm LEFT ATRIUM         Index LA diam:    3.10 cm 1.15 cm/m  AORTIC VALVE LVOT Vmax:   66.00 cm/s LVOT Vmean:  48.000 cm/s LVOT VTI:    0.129 m  AORTA Ao Root diam: 4.30 cm Ao Asc diam:  3.50 cm MITRAL VALVE MV Area (PHT): 4.17 cm    SHUNTS MV Decel Time: 182 msec    Systemic VTI:  0.13 m MR Peak grad: 1.9 mmHg     Systemic Diam: 2.60 cm MR Vmax:      69.50 cm/s MV E velocity: 58.70 cm/s MV A velocity: 54.80 cm/s MV E/A ratio:  1.07 Riley Lam MD Electronically signed by Riley Lam MD Signature Date/Time: 11/29/2022/4:37:22 PM    Final    VAS [place] LOWER EXTREMITY VENOUS (DVT)  Result Date: 11/29/2022  Lower Venous DVT Study Patient Name:  ZOHAIB CRAFT  Date of Exam:   11/29/2022 Medical Rec #: [id number]       Accession #:    [id number] Date of Birth: 06/12/1965        Patient Gender: M Patient Age:   57 years Exam Location:  [facility]  stenosis is present.  5. Aortic dilatation noted. There is mild dilatation of the aortic root, measuring 43 mm. Comparison(s): RV hypertrophy noted; RV has further dilated. FINDINGS  Left Ventricle: Left ventricular ejection fraction, by estimation, is 40 to 45%. The left ventricle has mildly decreased function. The left ventricle demonstrates global hypokinesis. Definity contrast agent was given IV to delineate the left ventricular  endocardial borders. The left ventricular internal cavity size was normal in size. There is severe asymmetric left ventricular hypertrophy of the septal segment. The interventricular septum is flattened in systole and diastole, consistent with right ventricular pressure and volume overload. Left ventricular diastolic parameters are consistent with Grade I diastolic dysfunction (impaired relaxation). Right Ventricle: The right ventricular size is severely enlarged. Moderately increased right ventricular wall thickness. Right ventricular systolic function is normal. Left Atrium: Left atrial size was normal in size. Right Atrium: Right atrial size was normal in size. Pericardium: There is no evidence of pericardial effusion. Mitral Valve: The mitral valve is normal in structure. No evidence of mitral valve regurgitation. Tricuspid Valve: The tricuspid valve is normal in structure. Tricuspid valve regurgitation is not demonstrated. No evidence of tricuspid stenosis. Aortic Valve: The aortic valve is  tricuspid. Aortic valve regurgitation is not visualized. No aortic stenosis is present. Pulmonic Valve: The pulmonic valve was normal in structure. Pulmonic valve regurgitation is not visualized. No evidence of pulmonic stenosis. Aorta: Aortic dilatation noted. There is mild dilatation of the aortic root, measuring 43 mm. IAS/Shunts: The atrial septum is grossly normal.  LEFT VENTRICLE PLAX 2D LVIDd:         4.90 cm   Diastology LVIDs:         3.60 cm   LV e' medial:    7.51 cm/s LV PW:         1.50 cm   LV E/e' medial:  7.8 LV IVS:        1.10 cm   LV e' lateral:   5.33 cm/s LVOT diam:     2.60 cm   LV E/e' lateral: 11.0 LV SV:         68 LV SV Index:   25 LVOT Area:     5.31 cm  RIGHT VENTRICLE RV S prime:     18.80 cm/s TAPSE (M-mode): 1.0 cm LEFT ATRIUM         Index LA diam:    3.10 cm 1.15 cm/m  AORTIC VALVE LVOT Vmax:   66.00 cm/s LVOT Vmean:  48.000 cm/s LVOT VTI:    0.129 m  AORTA Ao Root diam: 4.30 cm Ao Asc diam:  3.50 cm MITRAL VALVE MV Area (PHT): 4.17 cm    SHUNTS MV Decel Time: 182 msec    Systemic VTI:  0.13 m MR Peak grad: 1.9 mmHg     Systemic Diam: 2.60 cm MR Vmax:      69.50 cm/s MV E velocity: 58.70 cm/s MV A velocity: 54.80 cm/s MV E/A ratio:  1.07 Riley Lam MD Electronically signed by Riley Lam MD Signature Date/Time: 11/29/2022/4:37:22 PM    Final    VAS [place] LOWER EXTREMITY VENOUS (DVT)  Result Date: 11/29/2022  Lower Venous DVT Study Patient Name:  ZOHAIB CRAFT  Date of Exam:   11/29/2022 Medical Rec #: [id number]       Accession #:    [id number] Date of Birth: 06/12/1965        Patient Gender: M Patient Age:   57 years Exam Location:  [facility]

## 2022-12-01 NOTE — TOC Transition Note (Signed)
Transition of Care [facility]) - CM/SW Discharge Note   Patient Details  Name: Chad Rivas MRN: [id number] Date of Birth: 08-27-1965  [facility]) CM/SW Contact:  Kermit Balo, RN Phone Number: 12/01/2022, 11:17 AM   Clinical Narrative:    Pt is discharging home with outpatient therapy arranged through [facility]. Referral sent to the rehab and information on the AVS. Pt will call to schedule the first appointment.  Pt has transportation home.   Final next level of care: OP Rehab Barriers to Discharge: No Barriers Identified   Patient Goals and CMS Choice   Choice offered to / list presented to : Patient  Discharge Placement                         Discharge Plan and Services Additional resources added to the After Visit Summary for     Discharge Planning Services: CM Consult                                 Social Determinants of Health (SDOH) Interventions SDOH Screenings   Food Insecurity: No Food Insecurity (11/29/2022)  Housing: Low Risk  (11/29/2022)  Transportation Needs: No Transportation Needs (11/29/2022)  Utilities: Not At Risk (11/29/2022)  Tobacco Use: Low Risk  (11/28/2022)     Readmission Risk Interventions     No data to display

## 2022-12-01 NOTE — Plan of Care (Signed)
Problem: Fluid Volume: Goal: Hemodynamic stability will improve Outcome: Adequate for Discharge   Problem: Clinical Measurements: Goal: Diagnostic test results will improve Outcome: Adequate for Discharge Goal: Signs and symptoms of infection will decrease Outcome: Adequate for Discharge   Problem: Respiratory: Goal: Ability to maintain adequate ventilation will improve Outcome: Adequate for Discharge   Problem: Education: Goal: Ability to describe self-care measures that may prevent or decrease complications (Diabetes Survival Skills Education) will improve Outcome: Adequate for Discharge Goal: Individualized Educational Video(s) Outcome: Adequate for Discharge   Problem: Coping: Goal: Ability to adjust to condition or change in health will improve Outcome: Adequate for Discharge   Problem: Fluid Volume: Goal: Ability to maintain a balanced intake and output will improve Outcome: Adequate for Discharge   Problem: Health Behavior/Discharge Planning: Goal: Ability to identify and utilize available resources and services will improve Outcome: Adequate for Discharge Goal: Ability to manage health-related needs will improve Outcome: Adequate for Discharge   Problem: Metabolic: Goal: Ability to maintain appropriate glucose levels will improve Outcome: Adequate for Discharge   Problem: Nutritional: Goal: Maintenance of adequate nutrition will improve Outcome: Adequate for Discharge Goal: Progress toward achieving an optimal weight will improve Outcome: Adequate for Discharge   Problem: Skin Integrity: Goal: Risk for impaired skin integrity will decrease Outcome: Adequate for Discharge   Problem: Tissue Perfusion: Goal: Adequacy of tissue perfusion will improve Outcome: Adequate for Discharge   Problem: Education: Goal: Knowledge of disease or condition will improve Outcome: Adequate for Discharge Goal: Knowledge of secondary prevention will improve (MUST DOCUMENT  ALL) Outcome: Adequate for Discharge Goal: Knowledge of patient specific risk factors will improve Loraine Leriche N/A or DELETE if not current risk factor) Outcome: Adequate for Discharge   Problem: Ischemic Stroke/TIA Tissue Perfusion: Goal: Complications of ischemic stroke/TIA will be minimized Outcome: Adequate for Discharge   Problem: Coping: Goal: Will verbalize positive feelings about self Outcome: Adequate for Discharge Goal: Will identify appropriate support needs Outcome: Adequate for Discharge   Problem: Health Behavior/Discharge Planning: Goal: Ability to manage health-related needs will improve Outcome: Adequate for Discharge Goal: Goals will be collaboratively established with patient/family Outcome: Adequate for Discharge   Problem: Self-Care: Goal: Ability to participate in self-care as condition permits will improve Outcome: Adequate for Discharge Goal: Verbalization of feelings and concerns over difficulty with self-care will improve Outcome: Adequate for Discharge Goal: Ability to communicate needs accurately will improve Outcome: Adequate for Discharge   Problem: Nutrition: Goal: Risk of aspiration will decrease Outcome: Adequate for Discharge Goal: Dietary intake will improve Outcome: Adequate for Discharge   Problem: Education: Goal: Knowledge of General Education information will improve Description: Including pain rating scale, medication(s)/side effects and non-pharmacologic comfort measures Outcome: Adequate for Discharge   Problem: Health Behavior/Discharge Planning: Goal: Ability to manage health-related needs will improve Outcome: Adequate for Discharge   Problem: Clinical Measurements: Goal: Ability to maintain clinical measurements within normal limits will improve Outcome: Adequate for Discharge Goal: Will remain free from infection Outcome: Adequate for Discharge Goal: Diagnostic test results will improve Outcome: Adequate for Discharge Goal:  Respiratory complications will improve Outcome: Adequate for Discharge Goal: Cardiovascular complication will be avoided Outcome: Adequate for Discharge   Problem: Activity: Goal: Risk for activity intolerance will decrease Outcome: Adequate for Discharge   Problem: Nutrition: Goal: Adequate nutrition will be maintained Outcome: Adequate for Discharge   Problem: Coping: Goal: Level of anxiety will decrease Outcome: Adequate for Discharge   Problem: Elimination: Goal: Will not experience complications related to bowel motility Outcome:  Adequate for Discharge Goal: Will not experience complications related to urinary retention Outcome: Adequate for Discharge   Problem: Pain Managment: Goal: General experience of comfort will improve Outcome: Adequate for Discharge   Problem: Safety: Goal: Ability to remain free from injury will improve Outcome: Adequate for Discharge   Problem: Skin Integrity: Goal: Risk for impaired skin integrity will decrease Outcome: Adequate for Discharge

## 2022-12-01 NOTE — Progress Notes (Signed)
Nursing Discharge Note   Name: Chad Rivas MRN: [id number] DOB: Dec 10, 1965    Admit Date: 11/28/2022  Discharge Date: 12/01/2022   Chad Rivas is to be discharged home per MD order.  AVS completed. Reviewed with patient at bedside. Highlighted copy provided for patient to take home.  Patient able to verbalize understanding of discharge instructions. PIV removed. Patient stable upon discharge.   Discharge Instructions     Ambulatory referral to Physical Therapy   Complete by: As directed    Vestibular therapy   Diet - low sodium heart healthy   Complete by: As directed    Discharge instructions   Complete by: As directed    Outpatient vestibular PT Start Prednisone from tomorrow   Increase activity slowly   Complete by: As directed        Allergies as of 12/01/2022       Reactions   Penicillins Anaphylaxis, Other (See Comments)   Has patient had a PCN reaction causing immediate rash, facial/tongue/throat swelling, SOB or lightheadedness with hypotension: Y Has patient had a PCN reaction causing severe rash involving mucus membranes or skin necrosis: Y Has patient had a PCN reaction that required hospitalization: Y Has patient had a PCN reaction occurring within the last 10 years: N If all of the above answers are "NO", then may proceed with Cephalosporin use. Pt states he had hives that required Benadryl treatment    Bactrim [sulfamethoxazole-trimethoprim] Hives   Invokana [canagliflozin] Other (See Comments)   Hx of Fournier's gangrene    Penicillin G Procaine Hives   Losartan Other (See Comments)   Cause angioedema        Medication List     TAKE these medications    apixaban 5 MG Tabs tablet Commonly known as: ELIQUIS Take 1 tablet (5 mg total) by mouth 2 (two) times daily.   atorvastatin 40 MG tablet Commonly known as: LIPITOR Take 1 tablet (40 mg total) by mouth daily.   carvedilol 3.125 MG tablet Commonly known as: COREG Take 1 tablet (3.125 mg  total) by mouth 2 (two) times daily with a meal.   diphenhydrAMINE 25 MG tablet Commonly known as: BENADRYL Take 25 mg by mouth every 6 (six) hours as needed for itching or allergies.   EPINEPHrine 0.3 mg/0.3 mL Soaj injection Commonly known as: EpiPen 2-Pak Inject 0.3 mg into the muscle once as needed (For anaphylaxis.).   fluticasone 50 MCG/ACT nasal spray Commonly known as: FLONASE Place 1 spray into both nostrils daily as needed for allergies.   HumaLOG KwikPen 200 UNIT/ML KwikPen Generic drug: insulin lispro CHECK BLOOD GLUCOSE: LESS THAN 150 THEN 0 UNITS, IF 151-200 THEN INJECT 3 UNITS, 201-250 INJECT 5 UNITS; 251-300 INJECT 7 UNITS; 301-350 INJECT 9 UNITS; 301-400 INJECT 11 UNITS. PATIENT TO CHECK HIS BLOOD GLUCOSE 3 TIMES A DAY BEFORE EACH MEAL.   Lantus SoloStar 100 UNIT/ML Solostar Pen Generic drug: insulin glargine Inject 0.3 mLs (30 Units total) into the skin at bedtime. What changed: how much to take   loratadine 10 MG tablet Commonly known as: CLARITIN Take 10 mg by mouth daily.   Multivitamin Adult Tabs Take 1 tablet by mouth daily.   OneTouch Verio Flex System w/Device Kit Dispense based on patient and insurance preference. Use up to four times daily as directed. (FOR ICD-9 250.00, 250.01). For QAC - HS accuchecks.   OneTouch Verio test strip Generic drug: glucose blood Use four times daily as directed   oxymetazoline 0.05 % nasal  spray Commonly known as: AFRIN Place 1 spray into both nostrils 2 (two) times daily as needed for congestion.   Pentips 32G X 4 MM Misc Generic drug: Insulin Pen Needle Use for subcutaneous insulin 4 times daily.   predniSONE 10 MG tablet Commonly known as: DELTASONE Prednisone 30 mg po daily x 1 day then Prednisone 20 mg po daily x 1 day then Prednisone 10 mg daily x 1 day then stop... Start taking on: December 02, 2022   SM TRUEdraw Lancing Device Misc Use as directed   TRUEplus Lancets 28G Misc Use as directed four  times daily   Trulicity 0.75 MG/0.5ML Sopn Generic drug: Dulaglutide Inject 0.75 mg into the skin once a week. thursdays         Discharge Instructions/ Education: Discharge instructions given to patient with verbalized understanding. Discharge education completed with patient/family including: follow up instructions, medication list, discharge activities, and limitations if indicated. Patient and family able to verbalize understanding, all questions fully answered. Patient instructed to return to Emergency Department, call 911, or call MD for any changes in condition.  Patient escorted via wheelchair to lobby and discharged home via private automobile.

## 2022-12-01 NOTE — Progress Notes (Signed)
Patient discharged, dropped to main entrance A

## 2022-12-12 ENCOUNTER — Encounter: Payer: Self-pay | Admitting: Physical Therapy

## 2022-12-12 ENCOUNTER — Ambulatory Visit: Payer: 59 | Attending: Family Medicine | Admitting: Physical Therapy

## 2022-12-12 VITALS — BP 139/88 | HR 96

## 2022-12-12 DIAGNOSIS — R42 Dizziness and giddiness: Secondary | ICD-10-CM | POA: Diagnosis present

## 2022-12-12 DIAGNOSIS — I639 Cerebral infarction, unspecified: Secondary | ICD-10-CM | POA: Diagnosis not present

## 2022-12-12 DIAGNOSIS — R2681 Unsteadiness on feet: Secondary | ICD-10-CM | POA: Diagnosis present

## 2022-12-12 NOTE — Therapy (Signed)
OUTPATIENT PHYSICAL THERAPY VESTIBULAR EVALUATION     Patient Name: Chad Rivas MRN: [id number] DOB:1965/09/23, 57 y.o., male Today's Date: 12/12/2022  END OF SESSION:  PT End of Session - 12/12/22 1529     Visit Number 1    Number of Visits 9    Date for PT Re-Evaluation 01/11/23    Authorization Type [employer]    PT Start Time 1445    PT Stop Time 1527    PT Time Calculation (min) 42 min    Equipment Utilized During Treatment Gait belt    Activity Tolerance Patient tolerated treatment well    Behavior During Therapy WFL for tasks assessed/performed             Past Medical History:  Diagnosis Date   Diabetes mellitus without complication (HCC)    Heart failure with mildly reduced ejection fraction (HFmrEF) (HCC) 11/28/2022   History of pulmonary embolism 01/15/2022   Sleep apnea    pt has CPAP but does not use    Past Surgical History:  Procedure Laterality Date   BIOPSY  09/20/2022   Procedure: BIOPSY;  Surgeon: Charlott Rakes, MD;  Location: [facility];  Service: Gastroenterology;;   COLONOSCOPY WITH PROPOFOL N/A 09/20/2022   Procedure: COLONOSCOPY WITH PROPOFOL;  Surgeon: Charlott Rakes, MD;  Location: [facility];  Service: Gastroenterology;  Laterality: N/A;   ESOPHAGOGASTRODUODENOSCOPY (EGD) WITH PROPOFOL N/A 09/20/2022   Procedure: ESOPHAGOGASTRODUODENOSCOPY (EGD) WITH PROPOFOL;  Surgeon: Charlott Rakes, MD;  Location: [facility];  Service: Gastroenterology;  Laterality: N/A;   IRRIGATION AND DEBRIDEMENT ABSCESS Right 07/13/2017   Procedure: INCISION AND DRAINAGE OF GROIN ABSCESS;  Surgeon: Ihor Gully, MD;  Location: [facility];  Service: Urology;  Laterality: Right;   IRRIGATION AND DEBRIDEMENT ABSCESS N/A 01/15/2022   Procedure: IRRIGATION AND DEBRIDEMENT ABSCESS;  Surgeon: Crista Elliot, MD;  Location: [facility];  Service: Urology;  Laterality: N/A;   JOINT REPLACEMENT  2009   R hip   POLYPECTOMY  09/20/2022   Procedure: POLYPECTOMY;  Surgeon:  Charlott Rakes, MD;  Location: [facility];  Service: Gastroenterology;;   TOE SURGERY     TOTAL HIP ARTHROPLASTY     Patient Active Problem List   Diagnosis Date Noted   Vertigo 11/28/2022   Heart failure with mildly reduced ejection fraction (HFmrEF) (HCC) 11/28/2022   Iron deficiency anemia, unspecified 09/20/2022   Dysphagia 09/20/2022   History of pulmonary embolism 01/15/2022   SIRS (systemic inflammatory response syndrome) (HCC) 01/15/2022   OSA (obstructive sleep apnea) 01/15/2022   Microcytic anemia 01/15/2022   Abscess of right groin 07/13/2017   DM2 (diabetes mellitus, type 2) (HCC) 07/13/2017   Morbid obesity (HCC) 07/13/2017    PCP: Emilio Aspen, MD  REFERRING PROVIDER: Meredeth Ide, MD  REFERRING DIAG: I63.9 (ICD-10-CM) - Cerebrovascular accident (CVA), unspecified mechanism (HCC)   THERAPY DIAG:  Dizziness and giddiness  Unsteadiness on feet  ONSET DATE: 11/30/2022  Rationale for Evaluation and Treatment: Rehabilitation  SUBJECTIVE:   SUBJECTIVE STATEMENT: Took 3 days worth of Prednisone after leaving the hospital. Was given Notes balance has gotten better, but still feeling off. "Feels balanced, but not balanced". Turning his head fast has some lag where his head has to catch up. Doing better, but feeling a little uneasy today. Not using any AD for balance. Keeps himself in an area where he can grab onto things. Feels like he is drifting to the R. Feels like his thoughts are scrambled today. No falls. No nausea/vomiting/dizziness since  he was in the hospital. 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 Not driving.   Pt accompanied by: self  PERTINENT HISTORY: admitted on 11/28/22 after presenting with c/o acute onset of dizziness, N&V. Head CT was negative for acute issues, pt unable to receive MRI.  Unable to undergo brain MRI due to weight limits. PMH: type 2 diabetes mellitus, DVT & PE on eliquis, OSA, chronic HFmrEF, sleep apnea, obesity.  Discharged from hospital on 12/01/22  Saw PT in hospital who noted: Left beating nystagmus with R gaze;Left beating nystagmus with L gaze. PT differentials includes: Posterior circulation stroke versus vestibular neuritis. PT gave pt some exercises to work on at home (VOR and Austin Miles)   PAIN:  Are you having pain? No  Vitals:   12/12/22 1500  BP: 139/88  Pulse: 96     PRECAUTIONS: None  RED FLAGS: None   WEIGHT BEARING RESTRICTIONS: No  FALLS: Has patient fallen in last 6 months? 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   OBJECTIVE:  Note: Objective measures were completed at Evaluation unless otherwise noted.  DIAGNOSTIC FINDINGS: CT head 11/30/22 IMPRESSION: Stable head CT.  No acute intracranial abnormality.  11/28/22 CT angio head/neck IMPRESSION: No emergent large vessel occlusion or proximal hemodynamically significant stenosis.  COGNITION: Overall cognitive status: Within functional limits for tasks assessed   Cervical ROM:   WNL during eval.   GAIT: Gait pattern: step through pattern and wide BOS Distance walked: Clinic distances  Assistive device utilized: None Level of assistance: SBA Comments: Pt reports unsteadiness during ambulation  PATIENT SURVEYS:  FOTO Staff did not capture  VESTIBULAR ASSESSMENT:  GENERAL OBSERVATION: Ambulates in with no AD.    SYMPTOM BEHAVIOR:  Subjective history: See above.  Non-Vestibular symptoms: headaches and some neck stiffness   Type of dizziness: Imbalance (Disequilibrium)  Frequency: Everyday, esp when the first 4 steps  when he takes off walking  Duration: Couple seconds   Aggravating factors: Induced by position change: sit to stand and Induced by motion: turning body quickly and turning head quickly Has been moving more slowly   Relieving factors: rest and being still   Progression of symptoms: better  OCULOMOTOR EXAM:  Ocular Alignment: normal  Ocular ROM: No Limitations  Spontaneous Nystagmus: absent  Gaze-Induced Nystagmus: absent and 2 brief,mild instances of L beating nystagmus with L gaze  Smooth Pursuits: intact  Saccades: intact   VESTIBULAR - OCULAR REFLEX:   Slow VOR: Normal, some lightheadedness   VOR Cancellation: Normal  Head-Impulse Test: HIT Right: positive HIT Left: negative Pt reporting incr dizziness   Dynamic Visual Acuity: Static: Line 8 Dynamic: Line 7 Pt reporting feeling off balance afterwards, needing steadying from therapist     MOTION SENSITIVITY:  Motion Sensitivity Quotient Intensity: 0 = none, 1 = Lightheaded, 2 = Mild, 3 = Moderate, 4 = Severe, 5 = Vomiting  Intensity  1. Sitting to supine   2. Supine to L side   3. Supine to R side   4. Supine to sitting   5. L Hallpike-Dix   6. Up from L  7. R Hallpike-Dix   8. Up from R    9. Sitting, head tipped to L knee 0  10. Head up from L knee 0  11. Sitting, head tipped to R knee 0  12. Head up from R knee 0  13. Sitting head turns x5 1, feels a lagging afterwards  14.Sitting head nods x5 0  15. In stance, 180 turn to L  0  16. In stance, 180 turn to R 0    Feels a little off balance turning, no dizziness.      M-CTSIB  Condition 1: Firm Surface, EO 30 Sec, Normal Sway  Condition 2: Firm Surface, EC 30 Sec, Mild Sway  Condition 3: Foam Surface, EO 30 Sec, Mild Sway  Condition 4: Foam Surface, EC 4 Sec      VESTIBULAR TREATMENT:                                                                                                   N/A during eval   PATIENT EDUCATION: Education details: Clinical  findings, POC, handout on vestibular neuritis, do exercises given from vestibular PT in the hospital (VOR and Austin Miles) Person educated: Patient Education method: Programmer, multimedia, Demonstration, Verbal cues, and Handouts Education comprehension: verbalized understanding and returned demonstration  HOME EXERCISE PROGRAM: VOR x1 and Austin Miles from acute care, will give updated HEP at next session   GOALS: Goals reviewed with patient? Yes  SHORT TERM GOALS: ALL STGS = LTGS   LONG TERM GOALS: Target date: 01/10/2023  Pt will be independent with final HEP for vestibular deficits/balance in order to build upon functional gains made in therapy. Baseline:  Goal status: INITIAL  2.  FGA to be assessed with goal written.  Baseline:  Goal status: INITIAL  3.  Pt will improve condition 4 of mCTSIB to at least 20 seconds in order to demo improved vestibular input for balance.  Baseline: 4 seconds  Goal status: INITIAL  4.  DHI/Vestibular FOTO to be assessed with goal written.  Baseline:  Goal status: INITIAL  5.  Pt will perform DVA with 1 line or less difference with no dizziness/unsteadiness afterwards Baseline: 1 line difference, pt needing steadying support after due to unsteadiness  Goal status: INITIAL    ASSESSMENT:  CLINICAL IMPRESSION: Patient is a 57 year old male referred to Neuro OPPT for CVA/dizziness.   Pt's PMH is significant for: type 2 diabetes mellitus, DVT & PE on eliquis, OSA, chronic HFmrEF, sleep apnea, obesity. The following deficits were present during the exam: impaired balance, dysequilibrium/unsteadiness, positive HIT to the R indicating impaired VOR. Pt only able to hold condition 4 of mCTSIB for 4 seconds, indicating significantly decr vestibular input for balance. Findings seem consistent with vestibular neuritis. Will perform further vestibular/balance assessment at next session. Pt would benefit from skilled PT to address these impairments and functional  limitations to maximize functional mobility independence and decr dizziness/unsteadiness.    OBJECTIVE IMPAIRMENTS: Abnormal gait, decreased activity tolerance, decreased balance, difficulty walking, and dizziness.   ACTIVITY LIMITATIONS: bending, stairs, transfers, and locomotion level  PARTICIPATION  LIMITATIONS: driving, community activity, and occupation  PERSONAL FACTORS: Behavior pattern, Past/current experiences, Time since onset of injury/illness/exacerbation, and 3+ comorbidities: type 2 diabetes mellitus, DVT & PE on eliquis, OSA, chronic HFmrEF, sleep apnea, obesity  are also affecting patient's functional outcome.   REHAB POTENTIAL: Good  CLINICAL DECISION MAKING: Stable/uncomplicated  EVALUATION COMPLEXITY: Low   PLAN:  PT FREQUENCY: 2x/week  PT DURATION: 4 weeks  PLANNED INTERVENTIONS: Therapeutic exercises, Therapeutic activity, Neuromuscular re-education, Balance training, Gait training, Patient/Family education, Self Care, Joint mobilization, Vestibular training, and Re-evaluation  PLAN FOR NEXT SESSION: MAKE SURE PT DOES VESTIBULAR FOTO. Perform FGA and write goal. Initial HEP for VOR, vestibular input for balance    Ariele Vidrio N Taler Kushner, PT,DPT 12/12/2022, 3:30 PM

## 2022-12-14 ENCOUNTER — Ambulatory Visit (HOSPITAL_COMMUNITY): Payer: 59

## 2022-12-19 ENCOUNTER — Encounter: Payer: Self-pay | Admitting: Physical Therapy

## 2022-12-19 ENCOUNTER — Ambulatory Visit: Payer: 59 | Admitting: Physical Therapy

## 2022-12-19 DIAGNOSIS — R42 Dizziness and giddiness: Secondary | ICD-10-CM | POA: Diagnosis not present

## 2022-12-19 DIAGNOSIS — R2681 Unsteadiness on feet: Secondary | ICD-10-CM

## 2022-12-19 NOTE — Patient Instructions (Signed)
Gaze Stabilization: Standing Feet Apart    Feet shoulder width apart, keeping eyes on target on wall __a few__ feet away, tilt head down 15-30 and move head side to side for __30__ seconds. Repeat while moving head up and down for _30___ seconds. Perform 3 sets of each.  Do __2__ sessions per day.  Use a plain background.   Gaze Stabilization: Tip Card  1.Target must remain in focus, not blurry, and appear stationary while head is in motion. 2.Perform exercises with small head movements (45 to either side of midline). 3.Increase speed of head motion so long as target is in focus. 4.If you wear eyeglasses, be sure you can see target through lens (therapist will give specific instructions for bifocal / progressive lenses). 5.These exercises may provoke dizziness or nausea. Work through these symptoms. If too dizzy, slow head movement slightly. Rest between each exercise. 6.Exercises demand concentration; avoid distractions. 7.For safety, perform standing exercises close to a counter, wall, corner, or next to someone.

## 2022-12-19 NOTE — Therapy (Signed)
OUTPATIENT PHYSICAL THERAPY VESTIBULAR TREATMENT     Patient Name: Chad Rivas MRN: [id number] DOB:March 13, 1965, 57 y.o., male Today's Date: 12/19/2022  END OF SESSION:  PT End of Session - 12/19/22 1404     Visit Number 2    Number of Visits 9    Date for PT Re-Evaluation 01/11/23    Authorization Type U[employer]    PT Start Time 1403    PT Stop Time 1443    PT Time Calculation (min) 40 min    Equipment Utilized During Treatment Gait belt    Activity Tolerance Patient tolerated treatment well    Behavior During Therapy WFL for tasks assessed/performed             Past Medical History:  Diagnosis Date   Diabetes mellitus without complication (HCC)    Heart failure with mildly reduced ejection fraction (HFmrEF) (HCC) 11/28/2022   History of pulmonary embolism 01/15/2022   Sleep apnea    pt has CPAP but does not use    Past Surgical History:  Procedure Laterality Date   BIOPSY  09/20/2022   Procedure: BIOPSY;  Surgeon: Charlott Rakes, MD;  Location: [facility];  Service: Gastroenterology;;   COLONOSCOPY WITH PROPOFOL N/A 09/20/2022   Procedure: COLONOSCOPY WITH PROPOFOL;  Surgeon: Charlott Rakes, MD;  Location: [facility];  Service: Gastroenterology;  Laterality: N/A;   ESOPHAGOGASTRODUODENOSCOPY (EGD) WITH PROPOFOL N/A 09/20/2022   Procedure: ESOPHAGOGASTRODUODENOSCOPY (EGD) WITH PROPOFOL;  Surgeon: Charlott Rakes, MD;  Location: [facility];  Service: Gastroenterology;  Laterality: N/A;   IRRIGATION AND DEBRIDEMENT ABSCESS Right 07/13/2017   Procedure: INCISION AND DRAINAGE OF GROIN ABSCESS;  Surgeon: Ihor Gully, MD;  Location: [facility];  Service: Urology;  Laterality: Right;   IRRIGATION AND DEBRIDEMENT ABSCESS N/A 01/15/2022   Procedure: IRRIGATION AND DEBRIDEMENT ABSCESS;  Surgeon: Crista Elliot, MD;  Location: [facility];  Service: Urology;  Laterality: N/A;   JOINT REPLACEMENT  2009   R hip   POLYPECTOMY  09/20/2022   Procedure: POLYPECTOMY;  Surgeon:  Charlott Rakes, MD;  Location: [facility];  Service: Gastroenterology;;   TOE SURGERY     TOTAL HIP ARTHROPLASTY     Patient Active Problem List   Diagnosis Date Noted   Vertigo 11/28/2022   Heart failure with mildly reduced ejection fraction (HFmrEF) (HCC) 11/28/2022   Iron deficiency anemia, unspecified 09/20/2022   Dysphagia 09/20/2022   History of pulmonary embolism 01/15/2022   SIRS (systemic inflammatory response syndrome) (HCC) 01/15/2022   OSA (obstructive sleep apnea) 01/15/2022   Microcytic anemia 01/15/2022   Abscess of right groin 07/13/2017   DM2 (diabetes mellitus, type 2) (HCC) 07/13/2017   Morbid obesity (HCC) 07/13/2017    PCP: Emilio Aspen, MD  REFERRING PROVIDER: Meredeth Ide, MD  REFERRING DIAG: I63.9 (ICD-10-CM) - Cerebrovascular accident (CVA), unspecified mechanism (HCC)   THERAPY DIAG:  Dizziness and giddiness  Unsteadiness on feet  ONSET DATE: 11/30/2022  Rationale for Evaluation and Treatment: Rehabilitation  SUBJECTIVE:   SUBJECTIVE STATEMENT: Notes his balance is feeling better. Not as bad as it was. Dizziness is also getting better.   Pt accompanied by: self  PERTINENT HISTORY: admitted on 11/28/22 after presenting with c/o acute onset of dizziness, N&V. Head CT was negative for acute issues, pt unable to receive MRI.  Unable to undergo brain MRI due to weight limits. PMH: type 2 diabetes mellitus, DVT & PE on eliquis, OSA, chronic HFmrEF, sleep apnea, obesity. Discharged from hospital on 12/01/22  Saw PT in  hospital who noted: Left beating nystagmus with R gaze;Left beating nystagmus with L gaze. PT differentials includes: Posterior circulation stroke versus vestibular neuritis. PT gave pt some exercises to work on at home (VOR and Austin Miles)   PAIN:  Are you having pain? No  There were no vitals filed for this visit.    PRECAUTIONS: None  RED FLAGS: None   WEIGHT BEARING RESTRICTIONS: No  FALLS: Has patient  fallen in last 6 months? 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   OBJECTIVE:  Note: Objective measures were completed at Evaluation unless otherwise noted.  DIAGNOSTIC FINDINGS: CT head 11/30/22 IMPRESSION: Stable head CT.  No acute intracranial abnormality.  11/28/22 CT angio head/neck IMPRESSION: No emergent large vessel occlusion or proximal hemodynamically significant stenosis.  COGNITION: Overall cognitive status: Within functional limits for tasks assessed   Cervical ROM:   WNL during eval.   GAIT: Gait pattern: step through pattern and wide BOS Distance walked: Clinic distances  Assistive device utilized: None Level of assistance: SBA Comments: Pt reports unsteadiness during ambulation  PATIENT SURVEYS:  FOTO Staff did not capture  VESTIBULAR ASSESSMENT:  GENERAL OBSERVATION: Ambulates in with no AD.    SYMPTOM BEHAVIOR:  Subjective history: See above.  Non-Vestibular symptoms: headaches and some neck stiffness   Type of dizziness: Imbalance (Disequilibrium)  Frequency: Everyday, esp when the first 4 steps when he takes off walking  Duration: Couple seconds   Aggravating factors: Induced by position change: sit to stand and Induced by motion: turning body quickly and turning head quickly Has been moving more slowly   Relieving factors: rest and being still   Progression of symptoms: better  OCULOMOTOR EXAM:  Ocular Alignment: normal  Ocular ROM: No Limitations  Spontaneous Nystagmus: absent  Gaze-Induced Nystagmus: absent and 2 brief,mild instances of L beating nystagmus  with L gaze  Smooth Pursuits: intact  Saccades: intact   VESTIBULAR - OCULAR REFLEX:   Slow VOR: Normal, some lightheadedness   VOR Cancellation: Normal  Head-Impulse Test: HIT Right: positive HIT Left: negative Pt reporting incr dizziness   Dynamic Visual Acuity: Static: Line 8 Dynamic: Line 7 Pt reporting feeling off balance afterwards, needing steadying from therapist     MOTION SENSITIVITY:  Motion Sensitivity Quotient Intensity: 0 = none, 1 = Lightheaded, 2 = Mild, 3 = Moderate, 4 = Severe, 5 = Vomiting  Intensity  1. Sitting to supine   2. Supine to L side   3. Supine to R side   4. Supine to sitting   5. L Hallpike-Dix   6. Up from L    7. R Hallpike-Dix   8. Up from R    9. Sitting, head tipped to L knee 0  10. Head up from L knee 0  11. Sitting, head tipped to R knee 0  12. Head up from R knee 0  13. Sitting head turns x5 1, feels a lagging afterwards  14.Sitting head nods x5 0  15. In stance, 180 turn to L  0  16. In stance, 180 turn to R 0    Feels a  little off balance turning, no dizziness.      M-CTSIB  Condition 1: Firm Surface, EO 30 Sec, Normal Sway  Condition 2: Firm Surface, EC 30 Sec, Mild Sway  Condition 3: Foam Surface, EO 30 Sec, Mild Sway  Condition 4: Foam Surface, EC 4 Sec      VESTIBULAR TREATMENT:                                                                                                   DHI: 22/100 = Mild handicap   [facility] PT Assessment - 12/19/22 1410       Functional Gait  Assessment   Gait assessed  Yes    Gait Level Surface Walks 20 ft, slow speed, abnormal gait pattern, evidence for imbalance or deviates 10-15 in outside of the 12 in walkway width. Requires more than 7 sec to ambulate 20 ft.   8.47, pt reports this is close to his baseline speed   Change in Gait Speed Able to smoothly change walking speed without loss of balance or gait deviation. Deviate no more than 6 in outside of the 12 in walkway width.    Gait  with Horizontal Head Turns Performs head turns smoothly with no change in gait. Deviates no more than 6 in outside 12 in walkway width    Gait with Vertical Head Turns Performs task with slight change in gait velocity (eg, minor disruption to smooth gait path), deviates 6 - 10 in outside 12 in walkway width or uses assistive device    Gait and Pivot Turn Pivot turns safely within 3 sec and stops quickly with no loss of balance.    Step Over Obstacle Is able to step over one shoe box (4.5 in total height) without changing gait speed. No evidence of imbalance.    Gait with Narrow Base of Support Ambulates 4-7 steps.   5 steps   Gait with Eyes Closed Walks 20 ft, slow speed, abnormal gait pattern, evidence for imbalance, deviates 10-15 in outside 12 in walkway width. Requires more than 9 sec to ambulate 20 ft.   veers to R   Ambulating Backwards Walks 20 ft, uses assistive device, slower speed, mild gait deviations, deviates 6-10 in outside 12 in walkway width.    Steps Alternating feet, no rail.    Total Score 21    FGA comment: 21/30 = Medium Fall Risk             Gaze Adaptation: x1 Viewing Horizontal: Position: Standing, Time: 30 seconds, Reps: 3, and Comment: pt reporting feeling drunk afterwards x1 Viewing Vertical:  Position: Standing, Time: 30 seconds, Reps: 3, and Comment: Pt reporting feeling off and drunk afterwards, worse in vertical direction   Access Code: [email or web address] URL: [email or web address] Date: 12/19/2022 Prepared by: Sherlie Ban  Added below balance exercises to HEP:   Exercises - Standing Balance with Eyes Closed on Foam  - 1-2 x daily - 5 x weekly - 3 sets - 30 hold - Romberg Stance on Foam Pad with Head Rotation  - 1-2 x daily - 5 x weekly -  2 sets - 10 reps and 10 reps head nods  Pt reports feeling more off balance compared to dizzy   PATIENT EDUCATION: Education details: Results of FGA, purpose of vestibular rehab and purpose of exercises,  initial HEP for balance/standing VOR Person educated: Patient Education method: Explanation, Demonstration, Verbal cues, and Handouts Education comprehension: verbalized understanding and returned demonstration  HOME EXERCISE PROGRAM: VOR x1 Standing Vertical and Horizontal direction Austin Miles  Access Code: [email or web address] URL: [email or web address] Date: 12/19/2022 Prepared by: Sherlie Ban  Exercises - Standing Balance with Eyes Closed on Foam  - 1-2 x daily - 5 x weekly - 3 sets - 30 hold - Romberg Stance on Foam Pad with Head Rotation  - 1-2 x daily - 5 x weekly - 2 sets - 10 reps and head turns 10 reps   GOALS: Goals reviewed with patient? Yes  SHORT TERM GOALS: ALL STGS = LTGS   LONG TERM GOALS: Target date: 01/10/2023  Pt will be independent with final HEP for vestibular deficits/balance in order to build upon functional gains made in therapy. Baseline:  Goal status: INITIAL  2.  Pt will improve FGA to at least a 24/30 in order to demo decr fall risk.  Baseline: 21/30 Goal status: INITIAL  3.  Pt will improve condition 4 of mCTSIB to at least 20 seconds in order to demo improved vestibular input for balance.  Baseline: 4 seconds  Goal status: INITIAL  4. Pt will decr DHI to a 14 or less in order to demo improved dizziness  Baseline: 22/100 Goal status: INITIAL  5.  Pt will perform DVA with 1 line or less difference with no dizziness/unsteadiness afterwards Baseline: 1 line difference, pt needing steadying support after due to unsteadiness  Goal status: INITIAL    ASSESSMENT:  CLINICAL IMPRESSION: Today's skilled session focused on assessing FGA, with pt scoring a 21/30 (see above for more info), indicating a moderate fall risk. Pt filled out the [facility] and scored a 22/100, indicating a mild handicap in regards to dizziness. LTGs updated as appropriate. Remainder of session focused on initiating HEP for standing balance for vestibular input and  standing VOR. Pt more challenged with VOR x1 in the vertical direction, reports "feeling off and drunk" afterwards. Will continue per POC.    OBJECTIVE IMPAIRMENTS: Abnormal gait, decreased activity tolerance, decreased balance, difficulty walking, and dizziness.   ACTIVITY LIMITATIONS: bending, stairs, transfers, and locomotion level  PARTICIPATION LIMITATIONS: driving, community activity, and occupation  PERSONAL FACTORS: Behavior pattern, Past/current experiences, Time since onset of injury/illness/exacerbation, and 3+ comorbidities: type 2 diabetes mellitus, DVT & PE on eliquis, OSA, chronic HFmrEF, sleep apnea, obesity  are also affecting patient's functional outcome.   REHAB POTENTIAL: Good  CLINICAL DECISION MAKING: Stable/uncomplicated  EVALUATION COMPLEXITY: Low   PLAN:  PT FREQUENCY: 2x/week  PT DURATION: 4 weeks  PLANNED INTERVENTIONS: Therapeutic exercises, Therapeutic activity, Neuromuscular re-education, Balance training, Gait training, Patient/Family education, Self Care, Joint mobilization, Vestibular training, and Re-evaluation  PLAN FOR NEXT SESSION: progress VOR, vestibular input for balance, head motions    Venera Privott N Julietta Batterman, PT,DPT 12/19/2022, 2:45 PM

## 2022-12-21 ENCOUNTER — Ambulatory Visit: Payer: 59 | Admitting: Physical Therapy

## 2022-12-21 DIAGNOSIS — R42 Dizziness and giddiness: Secondary | ICD-10-CM

## 2022-12-21 DIAGNOSIS — R2681 Unsteadiness on feet: Secondary | ICD-10-CM

## 2022-12-21 NOTE — Therapy (Signed)
OUTPATIENT PHYSICAL THERAPY VESTIBULAR TREATMENT     Patient Name: MAICOL BOWLAND MRN: [id number] DOB:03-18-1965, 57 y.o., male Today's Date: 12/21/2022  END OF SESSION:  PT End of Session - 12/21/22 1453     Visit Number 3    Number of Visits 9    Date for PT Re-Evaluation 01/11/23    Authorization Type UHC    PT Start Time 1450    PT Stop Time 1530    PT Time Calculation (min) 40 min    Equipment Utilized During Treatment Gait belt    Activity Tolerance Patient tolerated treatment well    Behavior During Therapy WFL for tasks assessed/performed              Past Medical History:  Diagnosis Date   Diabetes mellitus without complication (HCC)    Heart failure with mildly reduced ejection fraction (HFmrEF) (HCC) 11/28/2022   History of pulmonary embolism 01/15/2022   Sleep apnea    pt has CPAP but does not use    Past Surgical History:  Procedure Laterality Date   BIOPSY  09/20/2022   Procedure: BIOPSY;  Surgeon: Charlott Rakes, MD;  Location: [facility];  Service: Gastroenterology;;   COLONOSCOPY WITH PROPOFOL N/A 09/20/2022   Procedure: COLONOSCOPY WITH PROPOFOL;  Surgeon: Charlott Rakes, MD;  Location: [facility];  Service: Gastroenterology;  Laterality: N/A;   ESOPHAGOGASTRODUODENOSCOPY (EGD) WITH PROPOFOL N/A 09/20/2022   Procedure: ESOPHAGOGASTRODUODENOSCOPY (EGD) WITH PROPOFOL;  Surgeon: Charlott Rakes, MD;  Location: [facility];  Service: Gastroenterology;  Laterality: N/A;   IRRIGATION AND DEBRIDEMENT ABSCESS Right 07/13/2017   Procedure: INCISION AND DRAINAGE OF GROIN ABSCESS;  Surgeon: Ihor Gully, MD;  Location: [facility];  Service: Urology;  Laterality: Right;   IRRIGATION AND DEBRIDEMENT ABSCESS N/A 01/15/2022   Procedure: IRRIGATION AND DEBRIDEMENT ABSCESS;  Surgeon: Crista Elliot, MD;  Location: [facility];  Service: Urology;  Laterality: N/A;   JOINT REPLACEMENT  2009   R hip   POLYPECTOMY  09/20/2022   Procedure: POLYPECTOMY;  Surgeon:  Charlott Rakes, MD;  Location: [facility];  Service: Gastroenterology;;   TOE SURGERY     TOTAL HIP ARTHROPLASTY     Patient Active Problem List   Diagnosis Date Noted   Vertigo 11/28/2022   Heart failure with mildly reduced ejection fraction (HFmrEF) (HCC) 11/28/2022   Iron deficiency anemia, unspecified 09/20/2022   Dysphagia 09/20/2022   History of pulmonary embolism 01/15/2022   SIRS (systemic inflammatory response syndrome) (HCC) 01/15/2022   OSA (obstructive sleep apnea) 01/15/2022   Microcytic anemia 01/15/2022   Abscess of right groin 07/13/2017   DM2 (diabetes mellitus, type 2) (HCC) 07/13/2017   Morbid obesity (HCC) 07/13/2017    PCP: Emilio Aspen, MD  REFERRING PROVIDER: Meredeth Ide, MD  REFERRING DIAG: I63.9 (ICD-10-CM) - Cerebrovascular accident (CVA), unspecified mechanism (HCC)   THERAPY DIAG:  No diagnosis found.  ONSET DATE: 11/30/2022  Rationale for Evaluation and Treatment: Rehabilitation  SUBJECTIVE:   SUBJECTIVE STATEMENT: Pt states dizziness has been at most 1/10. Balance is okay as long as eyes are open.   Pt accompanied by: self  PERTINENT HISTORY: admitted on 11/28/22 after presenting with c/o acute onset of dizziness, N&V. Head CT was negative for acute issues, pt unable to receive MRI.  Unable to undergo brain MRI due to weight limits. PMH: type 2 diabetes mellitus, DVT & PE on eliquis, OSA, chronic HFmrEF, sleep apnea, obesity. Discharged from hospital on 12/01/22  Saw PT in hospital who noted:  Left beating nystagmus with R gaze;Left beating nystagmus with L gaze. PT differentials includes: Posterior circulation stroke versus vestibular neuritis. PT gave pt some exercises to work on at home (VOR and Austin Miles)   PAIN:  Are you having pain? No  There were no vitals filed for this visit.    PRECAUTIONS: None  RED FLAGS: None   WEIGHT BEARING RESTRICTIONS: No  FALLS: Has patient fallen in last 6 months? 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   OBJECTIVE:  Note: Objective measures were completed at Evaluation unless otherwise noted.  DIAGNOSTIC FINDINGS: CT head 11/30/22 IMPRESSION: Stable head CT.  No acute intracranial abnormality.  11/28/22 CT angio head/neck IMPRESSION: No emergent large vessel occlusion or proximal hemodynamically significant stenosis.  COGNITION: Overall cognitive status: Within functional limits for tasks assessed   Cervical ROM:   WNL during eval.   GAIT: Gait pattern: step through pattern and wide BOS Distance walked: Clinic distances  Assistive device utilized: None Level of assistance: SBA Comments: Pt reports unsteadiness during ambulation  PATIENT SURVEYS:  FOTO Staff did not capture  VESTIBULAR ASSESSMENT:  GENERAL OBSERVATION: Ambulates in with no AD.    SYMPTOM BEHAVIOR:  Subjective history: See above.  Non-Vestibular symptoms: headaches and some neck stiffness   Type of dizziness: Imbalance (Disequilibrium)  Frequency: Everyday, esp when the first 4 steps when he takes off walking  Duration: Couple seconds   Aggravating factors: Induced by position change: sit to stand and Induced by motion: turning body quickly and turning head quickly Has been moving more slowly   Relieving factors: rest and being still   Progression of symptoms: better  OCULOMOTOR EXAM:  Ocular Alignment: normal  Ocular ROM: No Limitations  Spontaneous Nystagmus: absent  Gaze-Induced Nystagmus: absent and 2 brief,mild instances of L beating nystagmus with L gaze  Smooth Pursuits:  intact  Saccades: intact   VESTIBULAR - OCULAR REFLEX:   Slow VOR: Normal, some lightheadedness   VOR Cancellation: Normal  Head-Impulse Test: HIT Right: positive HIT Left: negative Pt reporting incr dizziness   Dynamic Visual Acuity: Static: Line 8 Dynamic: Line 7 Pt reporting feeling off balance afterwards, needing steadying from therapist     MOTION SENSITIVITY:  Motion Sensitivity Quotient Intensity: 0 = none, 1 = Lightheaded, 2 = Mild, 3 = Moderate, 4 = Severe, 5 = Vomiting  Intensity  1. Sitting to supine   2. Supine to L side   3. Supine to R side   4. Supine to sitting   5. L Hallpike-Dix   6. Up from L    7. R Hallpike-Dix   8. Up from R    9. Sitting, head tipped to L knee 0  10. Head up from L knee 0  11. Sitting, head tipped to R knee 0  12. Head up from R knee 0  13. Sitting head turns x5 1, feels a lagging afterwards  14.Sitting head nods x5 0  15. In stance, 180 turn to L  0  16. In stance, 180 turn to R 0    Feels a little off balance  turning, no dizziness.      M-CTSIB  Condition 1: Firm Surface, EO 30 Sec, Normal Sway  Condition 2: Firm Surface, EC 30 Sec, Mild Sway  Condition 3: Foam Surface, EO 30 Sec, Mild Sway  Condition 4: Foam Surface, EC 4 Sec    DHI: 22/100 = Mild handicap FGA: 21/30  VESTIBULAR TREATMENT:                                                                                                   Standing feet together  VOR x 1 horizontal (120 BPM) 2x30 sec on firm surface, x30 sec on foam  VOR x 1 vertical (120 BPM) 2x30 sec on foam  Saccades horizontal 2x30 sec on foam  Saccades vertical 2x30 sec on foam   VOR cancellation horizontal 2x30 sec on foam  VOR cancellation vertical 2x30 sec on foam  Eyes closed 2x30 sec  Eyes closed head turns 2x30 sec  Eyes closed head nods 2x30 sec Standing feet apart  Rockerboard A/P position, ankle PF/DF no UE support 2x1 min  Rockerboard A/P position static balance 2x30  sec  Rockerboard A/P position static balance head nods 2x 30 sec  Rockerboard A/P position static balance head turns 2x30 sec Ambulation around clinic, throwing ball up x 1 lap and then side to side x 1 lap   PATIENT EDUCATION: Education details: Results of FGA, purpose of vestibular rehab and purpose of exercises, initial HEP for balance/standing VOR Person educated: Patient Education method: Programmer, multimedia, Demonstration, Verbal cues, and Handouts Education comprehension: verbalized understanding and returned demonstration  HOME EXERCISE PROGRAM: VOR x1 Standing Vertical and Horizontal direction Austin Miles  Access Code: [email or web address] URL: [email or web address] Date: 12/19/2022 Prepared by: Sherlie Ban  Exercises - Standing Balance with Eyes Closed on Foam  - 1-2 x daily - 5 x weekly - 3 sets - 30 hold - Romberg Stance on Foam Pad with Head Rotation  - 1-2 x daily - 5 x weekly - 2 sets - 10 reps and head turns 10 reps   GOALS: Goals reviewed with patient? Yes  SHORT TERM GOALS: ALL STGS = LTGS   LONG TERM GOALS: Target date: 01/10/2023  Pt will be independent with final HEP for vestibular deficits/balance in order to build upon functional gains made in therapy. Baseline:  Goal status: INITIAL  2.  Pt will improve FGA to at least a 24/30 in order to demo decr fall risk.  Baseline: 21/30 Goal status: INITIAL  3.  Pt will improve condition 4 of mCTSIB to at least 20 seconds in order to demo improved vestibular input for balance.  Baseline: 4 seconds  Goal status: INITIAL  4. Pt will decr DHI to a 14 or less in order to demo improved dizziness  Baseline: 22/100 Goal status: INITIAL  5.  Pt will perform DVA with 1 line or less difference with no dizziness/unsteadiness afterwards Baseline: 1 line difference, pt needing steadying support after due to unsteadiness  Goal status: INITIAL    ASSESSMENT:  CLINICAL IMPRESSION: Jonathandavid continues to progress well  with his vestibular and balance exercises.  Added metronome to increase speed of his VOR x1. Continued to work on challenging pt's balance on compliant surfaces such as rockerboard and foam. Remains challenged with maintaining stability with head nodding motions. Added dynamic gait exercises with ball. Pt tolerated well with no overt dizziness but just unsteadiness.   OBJECTIVE IMPAIRMENTS: Abnormal gait, decreased activity tolerance, decreased balance, difficulty walking, and dizziness.    PLAN:  PT FREQUENCY: 2x/week  PT DURATION: 4 weeks  PLANNED INTERVENTIONS: Therapeutic exercises, Therapeutic activity, Neuromuscular re-education, Balance training, Gait training, Patient/Family education, Self Care, Joint mobilization, Vestibular training, and Re-evaluation  PLAN FOR NEXT SESSION: progress VOR, vestibular input for balance, head motions    Allyson Tineo April Ma L Tameia Rafferty, PT,DPT 12/21/2022, 2:53 PM

## 2022-12-26 ENCOUNTER — Ambulatory Visit: Payer: 59 | Admitting: Physical Therapy

## 2022-12-26 ENCOUNTER — Encounter: Payer: Self-pay | Admitting: Physical Therapy

## 2022-12-26 DIAGNOSIS — R42 Dizziness and giddiness: Secondary | ICD-10-CM | POA: Diagnosis not present

## 2022-12-26 DIAGNOSIS — R2681 Unsteadiness on feet: Secondary | ICD-10-CM

## 2022-12-26 NOTE — Therapy (Signed)
OUTPATIENT PHYSICAL THERAPY VESTIBULAR TREATMENT     Patient Name: Chad Rivas MRN: [id number] DOB:15-Aug-1965, 57 y.o., male Today's Date: 12/26/2022  END OF SESSION:  PT End of Session - 12/26/22 1405     Visit Number 4    Number of Visits 9    Date for PT Re-Evaluation 01/11/23    Authorization Type [employer]    PT Start Time 1404    PT Stop Time 1441    PT Time Calculation (min) 37 min    Equipment Utilized During Treatment Gait belt    Activity Tolerance Patient tolerated treatment well    Behavior During Therapy WFL for tasks assessed/performed              Past Medical History:  Diagnosis Date   Diabetes mellitus without complication (HCC)    Heart failure with mildly reduced ejection fraction (HFmrEF) (HCC) 11/28/2022   History of pulmonary embolism 01/15/2022   Sleep apnea    pt has CPAP but does not use    Past Surgical History:  Procedure Laterality Date   BIOPSY  09/20/2022   Procedure: BIOPSY;  Surgeon: Charlott Rakes, MD;  Location: [facility];  Service: Gastroenterology;;   COLONOSCOPY WITH PROPOFOL N/A 09/20/2022   Procedure: COLONOSCOPY WITH PROPOFOL;  Surgeon: Charlott Rakes, MD;  Location: [facility];  Service: Gastroenterology;  Laterality: N/A;   ESOPHAGOGASTRODUODENOSCOPY (EGD) WITH PROPOFOL N/A 09/20/2022   Procedure: ESOPHAGOGASTRODUODENOSCOPY (EGD) WITH PROPOFOL;  Surgeon: Charlott Rakes, MD;  Location: [facility];  Service: Gastroenterology;  Laterality: N/A;   IRRIGATION AND DEBRIDEMENT ABSCESS Right 07/13/2017   Procedure: INCISION AND DRAINAGE OF GROIN ABSCESS;  Surgeon: Ihor Gully, MD;  Location: [facility];  Service: Urology;  Laterality: Right;   IRRIGATION AND DEBRIDEMENT ABSCESS N/A 01/15/2022   Procedure: IRRIGATION AND DEBRIDEMENT ABSCESS;  Surgeon: Crista Elliot, MD;  Location: [facility];  Service: Urology;  Laterality: N/A;   JOINT REPLACEMENT  2009   R hip   POLYPECTOMY  09/20/2022   Procedure: POLYPECTOMY;  Surgeon:  Charlott Rakes, MD;  Location: [facility];  Service: Gastroenterology;;   TOE SURGERY     TOTAL HIP ARTHROPLASTY     Patient Active Problem List   Diagnosis Date Noted   Vertigo 11/28/2022   Heart failure with mildly reduced ejection fraction (HFmrEF) (HCC) 11/28/2022   Iron deficiency anemia, unspecified 09/20/2022   Dysphagia 09/20/2022   History of pulmonary embolism 01/15/2022   SIRS (systemic inflammatory response syndrome) (HCC) 01/15/2022   OSA (obstructive sleep apnea) 01/15/2022   Microcytic anemia 01/15/2022   Abscess of right groin 07/13/2017   DM2 (diabetes mellitus, type 2) (HCC) 07/13/2017   Morbid obesity (HCC) 07/13/2017    PCP: Emilio Aspen, MD  REFERRING PROVIDER: Meredeth Ide, MD  REFERRING DIAG: I63.9 (ICD-10-CM) - Cerebrovascular accident (CVA), unspecified mechanism (HCC)   THERAPY DIAG:  Dizziness and giddiness  Unsteadiness on feet  ONSET DATE: 11/30/2022  Rationale for Evaluation and Treatment: Rehabilitation  SUBJECTIVE:   SUBJECTIVE STATEMENT: Reports dizziness has been getting better. Felt a little uneasy on Saturday, but laid down and took a nap and was good. Dizziness currently a 0/10.   Pt accompanied by: self  PERTINENT HISTORY: admitted on 11/28/22 after presenting with c/o acute onset of dizziness, N&V. Head CT was negative for acute issues, pt unable to receive MRI.  Unable to undergo brain MRI due to weight limits. PMH: type 2 diabetes mellitus, DVT & PE on eliquis, OSA, chronic HFmrEF, sleep apnea,  obesity. Discharged from hospital on 12/01/22  Saw PT in hospital who noted: Left beating nystagmus with R gaze;Left beating nystagmus with L gaze. PT differentials includes: Posterior circulation stroke versus vestibular neuritis. PT gave pt some exercises to work on at home (VOR and Austin Miles)   PAIN:  Are you having pain? No  There were no vitals filed for this visit.    PRECAUTIONS: None  RED  FLAGS: None   WEIGHT BEARING RESTRICTIONS: No  FALLS: Has patient fallen in last 6 months? 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   OBJECTIVE:  Note: Objective measures were completed at Evaluation unless otherwise noted.  DIAGNOSTIC FINDINGS: CT head 11/30/22 IMPRESSION: Stable head CT.  No acute intracranial abnormality.  11/28/22 CT angio head/neck IMPRESSION: No emergent large vessel occlusion or proximal hemodynamically significant stenosis.  COGNITION: Overall cognitive status: Within functional limits for tasks assessed   Cervical ROM:   WNL during eval.   GAIT: Gait pattern: step through pattern and wide BOS Distance walked: Clinic distances  Assistive device utilized: None Level of assistance: SBA Comments: Pt reports unsteadiness during ambulation  PATIENT SURVEYS:  FOTO Staff did not capture  VESTIBULAR ASSESSMENT:  GENERAL OBSERVATION: Ambulates in with no AD.    SYMPTOM BEHAVIOR:  Subjective history: See above.  Non-Vestibular symptoms: headaches and some neck stiffness   Type of dizziness: Imbalance (Disequilibrium)  Frequency: Everyday, esp when the first 4 steps when he takes off walking  Duration: Couple seconds   Aggravating factors: Induced by position change: sit to stand and Induced by motion: turning body quickly and turning head quickly Has been moving more slowly   Relieving factors: rest and being still   Progression of symptoms: better  OCULOMOTOR EXAM:  Ocular Alignment: normal  Ocular ROM: No Limitations  Spontaneous Nystagmus:  absent  Gaze-Induced Nystagmus: absent and 2 brief,mild instances of L beating nystagmus with L gaze  Smooth Pursuits: intact  Saccades: intact   VESTIBULAR - OCULAR REFLEX:   Slow VOR: Normal, some lightheadedness   VOR Cancellation: Normal  Head-Impulse Test: HIT Right: positive HIT Left: negative Pt reporting incr dizziness   Dynamic Visual Acuity: Static: Line 8 Dynamic: Line 7 Pt reporting feeling off balance afterwards, needing steadying from therapist     MOTION SENSITIVITY:  Motion Sensitivity Quotient Intensity: 0 = none, 1 = Lightheaded, 2 = Mild, 3 = Moderate, 4 = Severe, 5 = Vomiting  Intensity  1. Sitting to supine   2. Supine to L side   3. Supine to R side   4. Supine to sitting   5. L Hallpike-Dix   6. Up from L    7. R Hallpike-Dix   8. Up from R    9. Sitting, head tipped to L knee 0  10. Head up from L knee 0  11. Sitting, head tipped to R knee 0  12. Head up from R knee 0  13. Sitting head turns x5 1, feels a lagging afterwards  14.Sitting head nods x5 0  15. In stance, 180 turn to L  0  16. In stance,  180 turn to R 0    Feels a little off balance turning, no dizziness.      M-CTSIB  Condition 1: Firm Surface, EO 30 Sec, Normal Sway  Condition 2: Firm Surface, EC 30 Sec, Mild Sway  Condition 3: Foam Surface, EO 30 Sec, Mild Sway  Condition 4: Foam Surface, EC 4 Sec    DHI: 22/100 = Mild handicap FGA: 21/30  VESTIBULAR TREATMENT:         NMR: Gaze Adaptation: x1 Viewing Horizontal: Position: Standing w/ busy background, Time: 30 seconds, Reps: 2, and Comment: No symptoms x1 Viewing Vertical:  Position: Standing w/ busy background, Time: 30 seconds, Reps: 2, and Comment: No symptoms  Forward gait VOR x1  Horizontal direction 4 x 20' Vertical direction 4 x 20' Pt initially more challenged in horizontal direction, improved with incr reps, no dizziness/just disoriented  On air ex:  Holding ball and making larger diagonals to tap wall  superiorly and inferiorly, 10 reps each side, pt more imbalanced going up to the R and down to the L  Holding ball and making CW and CCW circles 2 x 10 reps each direction, feet hip width > together Narrow BOS EC 10 reps diagonal head motions each direction, more unsteady going up to R  Feet together EC 2 x 30 seconds  EO marching 10 reps, EO marching with 10 head turns, EC marching 10 reps  2 x 5 reps sit <> stands with EC  Alternating forward stepping to head turns to R/L 10 reps each side                                                                                             PATIENT EDUCATION: Education details: Results of condition 4 of mCTSIB, progressing VOR x1 for HEP during gait and adding diagonal head movements to HEP with EC, discussed dropping down to 1x week due to progress with pt in agreement with plan  Person educated: Patient Education method: Explanation, Demonstration, Verbal cues, and Handouts Education comprehension: verbalized understanding and returned demonstration  HOME EXERCISE PROGRAM: VOR x1 Standing Vertical and Horizontal direction - walking in a hallway Austin Miles  Access Code: [email or web address] URL: [email or web address] Date: 12/19/2022 Prepared by: Sherlie Ban  Exercises - Standing Balance with Eyes Closed on Foam  - 1-2 x daily - 5 x weekly - 3 sets - 30 hold - Romberg Stance on Foam Pad with Head Rotation  - 1-2 x daily - 5 x weekly - 2 sets - 10 reps and head turns 10 reps, adding diagonal head motions in each direction  GOALS: Goals reviewed with patient? Yes  SHORT TERM GOALS: ALL STGS = LTGS   LONG TERM GOALS: Target date: 01/10/2023  Pt will be independent with final HEP for vestibular deficits/balance in order to build upon functional gains made in therapy. Baseline:  Goal status: INITIAL  2.  Pt will improve FGA to at least a 24/30 in order to demo decr fall risk.  Baseline: 21/30 Goal status: INITIAL  3.  Pt will  improve condition 4 of mCTSIB to at least 20 seconds in  order to demo improved vestibular input for balance.  Baseline: 4 seconds   30 seconds (10/21) Goal status: MET  4. Pt will decr DHI to a 14 or less in order to demo improved dizziness  Baseline: 22/100 Goal status: INITIAL  5.  Pt will perform DVA with 1 line or less difference with no dizziness/unsteadiness afterwards Baseline: 1 line difference, pt needing steadying support after due to unsteadiness  Goal status: INITIAL    ASSESSMENT:  CLINICAL IMPRESSION: Pt reports improvement in balance and dizziness. Pt reporting baseline dizziness as 0/10. Today's skilled session focused on progressing vestibular exercises. Pt reporting no dizziness, just unsteadiness. Pt challenged with diagonals when going to the R and felt more off balance. Pt met LTG #3 and improved condition 4 to 30 seconds (previously 4 seconds), indicating improved vestibular input for balance. Discussed dropping down to 1x a week due to progress with pt in agreement with plan.    OBJECTIVE IMPAIRMENTS: Abnormal gait, decreased activity tolerance, decreased balance, difficulty walking, and dizziness.    PLAN:  PT FREQUENCY: 2x/week  PT DURATION: 4 weeks  PLANNED INTERVENTIONS: Therapeutic exercises, Therapeutic activity, Neuromuscular re-education, Balance training, Gait training, Patient/Family education, Self Care, Joint mobilization, Vestibular training, and Re-evaluation  PLAN FOR NEXT SESSION: progress VOR, vestibular input for balance, head motions    Drake Leach, PT,DPT 12/26/2022, 3:32 PM

## 2022-12-28 ENCOUNTER — Encounter: Payer: 59 | Admitting: Physical Therapy

## 2023-01-02 ENCOUNTER — Ambulatory Visit: Payer: 59 | Admitting: Physical Therapy

## 2023-01-04 ENCOUNTER — Ambulatory Visit: Payer: 59 | Admitting: Physical Therapy

## 2023-01-04 ENCOUNTER — Encounter: Payer: 59 | Admitting: Physical Therapy

## 2023-01-09 ENCOUNTER — Ambulatory Visit: Payer: 59 | Admitting: Physical Therapy

## 2023-01-10 ENCOUNTER — Encounter: Payer: Self-pay | Admitting: Physical Therapy

## 2023-01-10 NOTE — Therapy (Signed)
[facility] [address] [place], [place], [address] Phone: [phone]   Fax:  [phone]  Patient Details  Name: Chad Rivas MRN: [id number] Date of Birth: 1966-01-19 Referring Provider:  No ref. provider found  Encounter Date: 01/10/2023  PHYSICAL THERAPY DISCHARGE SUMMARY  Visits from Start of Care: 4  Current functional level related to goals / functional outcomes: Did not get to assess LTGs, pt calling and requesting to D/C due to feeling much better with his dizziness   Remaining deficits: N/A   Education / Equipment: HEP   Patient agrees to discharge. Patient goals were met. Patient is being discharged due to the patient's request. Pt calling to request to D/C due to feeling much better in regards to his dizziness.   Drake Leach, PT, DPT 01/10/2023, 4:29 PM  [facility] [address] [place], [place], [address] Phone: [phone]   Fax:  [phone]

## 2023-01-11 ENCOUNTER — Ambulatory Visit: Payer: 59 | Admitting: Physical Therapy

## 2023-02-08 ENCOUNTER — Ambulatory Visit: Payer: 59 | Admitting: Cardiology

## 2023-05-25 ENCOUNTER — Ambulatory Visit: Payer: 59 | Attending: Cardiology | Admitting: Cardiology

## 2023-05-25 ENCOUNTER — Encounter: Payer: Self-pay | Admitting: Cardiology

## 2023-05-25 VITALS — BP 136/84 | HR 91 | Resp 12 | Ht 71.0 in | Wt 359.2 lb

## 2023-05-25 DIAGNOSIS — Z6841 Body Mass Index (BMI) 40.0 and over, adult: Secondary | ICD-10-CM

## 2023-05-25 DIAGNOSIS — I5022 Chronic systolic (congestive) heart failure: Secondary | ICD-10-CM

## 2023-05-25 DIAGNOSIS — E66813 Obesity, class 3: Secondary | ICD-10-CM

## 2023-05-25 DIAGNOSIS — D6859 Other primary thrombophilia: Secondary | ICD-10-CM | POA: Diagnosis not present

## 2023-05-25 DIAGNOSIS — I77810 Thoracic aortic ectasia: Secondary | ICD-10-CM

## 2023-05-25 DIAGNOSIS — E119 Type 2 diabetes mellitus without complications: Secondary | ICD-10-CM

## 2023-05-25 MED ORDER — CARVEDILOL 6.25 MG PO TABS: 6.2500 mg | ORAL_TABLET | Freq: Two times a day (BID) | ORAL | 3 refills | Status: AC

## 2023-05-25 MED ORDER — SPIRONOLACTONE 25 MG PO TABS: 25.0000 mg | ORAL_TABLET | Freq: Every day | ORAL | 6 refills | Status: DC

## 2023-05-25 NOTE — Patient Instructions (Signed)
 Medication Instructions:  Your physician has recommended you make the following change in your medication:  Increase Carvedilol to 6.25 mg by mouth twice daily  Start spironolactone 25 mg once daily   *If you need a refill on your cardiac medications before your next appointment, please call your pharmacy*   Lab Work: Have lab work (BMP) checked in 10 days.  Can be done at any [facility].  There is an office on the first floor of our building If you have labs (blood work) drawn today and your tests are completely normal, you will receive your results only by: MyChart Message (if you have MyChart) OR A paper copy in the mail If you have any lab test that is abnormal or we need to change your treatment, we will call you to review the results.   Testing/Procedures: none   Follow-Up: At [facility], you and your health needs are our priority.  As part of our continuing mission to provide you with exceptional heart care, we have created designated Provider Care Teams.  These Care Teams include your primary Cardiologist (physician) and Advanced Practice Providers (APPs -  Physician Assistants and Nurse Practitioners) who all work together to provide you with the care you need, when you need it.  We recommend signing up for the patient portal called "MyChart".  Sign up information is provided on this After Visit Summary.  MyChart is used to connect with patients for Virtual Visits (Telemedicine).  Patients are able to view lab/test results, encounter notes, upcoming appointments, etc.  Non-urgent messages can be sent to your provider as well.   To learn more about what you can do with MyChart, go to [email or web address].    Your next appointment:   June 20 at 8:40  Provider:   Yates Decamp, MD     Other Instructions You have been referred to see the pharmacist in our office.  Office will call you to schedule this appointment

## 2023-05-25 NOTE — Progress Notes (Signed)
 Cardiology Office Note:  .   Date:  05/28/2023  ID:  Chad Rivas, DOB 1966/03/07, MRN [id number] PCP: Emilio Aspen, MD  [facility] Providers Cardiologist:  Yates Decamp, MD   History of Present Illness: .   Chad Rivas is a 58 y.o. male with history of type 2 diabetes, morbid obesity, OSA compliant with CPAP, history of DVT after right hip replacement 15 years ago.    Presented to [facility] emergency department 08/11/2020 with progressive shortness of breath, evaluation revealed acute PE and left leg DVT.  He was therefore started on Eliquis and echocardiogram during stay revealed mild systolic dysfunction with LVEF of 45%. However f/u echo revealed normal LVEF in Sept 2022.   Discussed the use of AI scribe software for clinical note transcription with the patient, who gave verbal consent to proceed.  History of Present Illness Chad Rivas is a 58 year old male with diabetes and heart failure who presents for a cardiology follow-up. He was referred by Dr. Orson Aloe for a cardiology evaluation.  He has heart failure with a reduced ejection fraction of 40-45%, decreased from 45-50% in 2023. He experiences stable dyspnea without peripheral edema or paroxysmal nocturnal dyspnea. Current medications include carvedilol 3.125 mg and atorvastatin 40 mg. He acknowledges the need for increased physical activity and weight loss, though he finds it challenging. 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  He manages diabetes with Trulicity 1.5 mg and insulin. His A1c was 8% last month, improved from 8.6% in January 2025. He uses a Dexcom for glucose monitoring. He previously discontinued Ozempic due to severe abdominal cramping and bowel obstruction. He is cautious about medications that may cause infections, recalling an association between Invokana and an infection.  He denies alcohol consumption and reports well-controlled cholesterol. He uses a CPAP machine for sleep  apnea.  Labs   Lab Results  Component Value Date   CHOL 86 11/29/2022   HDL 31 (L) 11/29/2022   LDLCALC 48 11/29/2022   TRIG 37 11/29/2022   CHOLHDL 2.8 11/29/2022   Lab Results  Component Value Date   NA 137 11/30/2022   K 3.7 11/30/2022   CO2 30 11/30/2022   GLUCOSE 184 (H) 11/30/2022   BUN 11 11/30/2022   CREATININE 1.03 11/30/2022   CALCIUM 8.5 (L) 11/30/2022   GFRNONAA >60 11/30/2022      Latest Ref Rng & Units 11/30/2022    7:47 AM 11/29/2022    2:55 AM 11/28/2022    7:15 PM  BMP  Glucose 70 - 99 mg/dL 865  784  696   BUN 6 - 20 mg/dL 11  10  14    Creatinine 0.61 - 1.24 mg/dL 2.95  2.84  1.32   Sodium 135 - 145 mmol/L 137  136  138   Potassium 3.5 - 5.1 mmol/L 3.7  4.3  4.1   Chloride 98 - 111 mmol/L 98  100  99   CO2 22 - 32 mmol/L 30  26    Calcium 8.9 - 10.3 mg/dL 8.5  8.7        Latest Ref Rng & Units 11/30/2022    7:47 AM 11/29/2022    2:55 AM 11/28/2022    7:15 PM  CBC  WBC 4.0 - 10.5 K/uL 8.4  13.6    Hemoglobin 13.0 - 17.0 g/dL 44.0  10.2  72.5   Hematocrit 39.0 - 52.0 % 39.9  40.5  43.0   Platelets 150 - 400  K/uL 353  360     Lab Results  Component Value Date   HGBA1C 9.7 (H) 11/29/2022   PCP labs 04/07/2023:  A1c 8.6%.  Labs 04/26/2022: TSH 1.290, normal.  Review of Systems  Cardiovascular:  Negative for chest pain, dyspnea on exertion and leg swelling.   Physical Exam:   VS:  BP 136/84 (BP Location: Left Arm, Patient Position: Sitting, Cuff Size: Large)   Pulse 91   Resp 12   Ht 5\' 11"  (1.803 m)   Wt (!) 359 lb 3.2 oz (162.9 kg)   SpO2 93%   BMI 50.10 kg/m    Wt Readings from Last 3 Encounters:  05/25/23 (!) 359 lb 3.2 oz (162.9 kg)  11/28/22 (!) 354 lb 0.9 oz (160.6 kg)  09/20/22 (!) 354 lb (160.6 kg)    Physical Exam Constitutional:      Comments: Morbidly obese in no acute distress.  Neck:     Vascular: No carotid bruit or JVD.  Cardiovascular:     Rate and Rhythm: Normal rate and regular rhythm.     Pulses: Intact distal  pulses.          Carotid pulses are 2+ on the right side and 2+ on the left side.    Heart sounds: Normal heart sounds. No murmur heard.    No gallop.  Pulmonary:     Effort: Pulmonary effort is normal.     Breath sounds: Normal breath sounds.  Abdominal:     General: Bowel sounds are normal.     Palpations: Abdomen is soft.     Comments: Obese. Pannus present  Musculoskeletal:     Right lower leg: No edema.     Left lower leg: No edema.    Studies Reviewed: Marland Kitchen    ECHOCARDIOGRAM COMPLETE 11/29/2022  1. Left ventricular ejection fraction, by estimation, is 40 to 45%. The left ventricle has mildly decreased function. The left ventricle demonstrates global hypokinesis. There is severe asymmetric left ventricular hypertrophy of the septal segment. Left ventricular diastolic parameters are consistent with Grade I diastolic dysfunction (impaired relaxation). There is the interventricular septum is flattened in systole and diastole, consistent with right ventricular pressure and volume overload. 2. Right ventricular systolic function is normal. The right ventricular size is severely enlarged. Moderately increased right ventricular wall thickness. 3. The mitral valve is normal in structure. No evidence of mitral valve regurgitation. 4. The aortic valve is tricuspid. Aortic valve regurgitation is not visualized. No aortic stenosis is present. 5. Aortic dilatation noted. There is mild dilatation of the aortic root, measuring 43 mm.   EKG:    EKG Interpretation Date/Time:  Thursday May 25 2023 16:30:54 EDT Ventricular Rate:  91 PR Interval:  170 QRS Duration:  98 QT Interval:  354 QTC Calculation: 435 R Axis:   -60  Text Interpretation: EKG 05/25/2023: Normal sinus rhythm at rate of 91 bpm, left intrafascicular block.  Poor R progression, cannot exclude anteroseptal infarct or.  No significant change since 11/30/2022.   Confirmed by Delrae Rend [phone]) on 05/25/2023 5:25:54 PM     Medications and allergies    Allergies  Allergen Reactions   Invokana [Canagliflozin] Other (See Comments)    Hx of Fournier's gangrene    Losartan Swelling    Cause angioedema   Penicillins Anaphylaxis and Other (See Comments)    Has patient had a PCN reaction causing immediate rash, facial/tongue/throat swelling, SOB or lightheadedness with hypotension: Y Has patient had a PCN reaction causing severe  rash involving mucus membranes or skin necrosis: Y Has patient had a PCN reaction that required hospitalization: Y Has patient had a PCN reaction occurring within the last 10 years: N If all of the above answers are "NO", then may proceed with Cephalosporin use.   Pt states he had hives that required Benadryl treatment     Bactrim [Sulfamethoxazole-Trimethoprim] Hives   Penicillin G Procaine Hives     Current Outpatient Medications:    apixaban (ELIQUIS) 5 MG TABS tablet, Take 1 tablet (5 mg total) by mouth 2 (two) times daily., Disp: 60 tablet, Rfl: 3   atorvastatin (LIPITOR) 40 MG tablet, Take 1 tablet (40 mg total) by mouth daily., Disp: 30 tablet, Rfl: 0   blood glucose meter kit and supplies KIT, Dispense based on patient and insurance preference. Use up to four times daily as directed. (FOR ICD-9 250.00, 250.01). For QAC - HS accuchecks., Disp: 1 each, Rfl: 1   carvedilol (COREG) 6.25 MG tablet, Take 1 tablet (6.25 mg total) by mouth 2 (two) times daily., Disp: 60 tablet, Rfl: 3   diphenhydrAMINE (BENADRYL) 25 MG tablet, Take 25 mg by mouth every 6 (six) hours as needed for itching or allergies., Disp: , Rfl:    Dulaglutide 1.5 MG/0.5ML SOAJ, Inject 1.5 mg into the skin once a week. thursdays, Disp: , Rfl:    EPINEPHrine (EPIPEN 2-PAK) 0.3 mg/0.3 mL IJ SOAJ injection, Inject 0.3 mg into the muscle once as needed (For anaphylaxis.)., Disp: , Rfl:    fluticasone (FLONASE) 50 MCG/ACT nasal spray, Place 1 spray into both nostrils daily as needed for allergies. , Disp: , Rfl: 3    glucose blood test strip, Use four times daily as directed, Disp: 100 each, Rfl: 0   HUMALOG KWIKPEN 200 UNIT/ML KwikPen, CHECK BLOOD GLUCOSE: LESS THAN 150 THEN 0 UNITS, IF 151-200 THEN INJECT 3 UNITS, 201-250 INJECT 5 UNITS; 251-300 INJECT 7 UNITS; 301-350 INJECT 9 UNITS; 301-400 INJECT 11 UNITS. PATIENT TO CHECK HIS BLOOD GLUCOSE 3 TIMES A DAY BEFORE EACH MEAL., Disp: , Rfl:    insulin glargine (LANTUS SOLOSTAR) 100 UNIT/ML Solostar Pen, Inject 0.3 mLs (30 Units total) into the skin at bedtime. (Patient taking differently: Inject 60 Units into the skin at bedtime.), Disp: 15 mL, Rfl: 0   Insulin Pen Needle 32G X 4 MM MISC, Use for subcutaneous insulin 4 times daily., Disp: 100 each, Rfl: 0   Lancet Devices (AUTOLET MINI) MISC, Use as directed, Disp: 1 each, Rfl: 0   loratadine (CLARITIN) 10 MG tablet, Take 10 mg by mouth daily., Disp: , Rfl:    Multiple Vitamins-Minerals (MULTIVITAMIN ADULT) TABS, Take 1 tablet by mouth daily., Disp: , Rfl:    oxymetazoline (AFRIN) 0.05 % nasal spray, Place 1 spray into both nostrils 2 (two) times daily as needed for congestion., Disp: , Rfl:    spironolactone (ALDACTONE) 25 MG tablet, Take 1 tablet (25 mg total) by mouth daily., Disp: 30 tablet, Rfl: 6   TRUEplus Lancets 28G MISC, Use as directed four times daily, Disp: 100 each, Rfl: 0   Vitamin D, Ergocalciferol, (DRISDOL) 1.25 MG (50000 UNIT) CAPS capsule, Take 50,000 Units by mouth once a week., Disp: , Rfl:    ASSESSMENT AND PLAN: .      ICD-10-CM   1. Chronic heart failure with mildly reduced ejection fraction (HFmrEF, 41-49%) (HCC)  I50.22 EKG 12-Lead    Basic Metabolic Panel (BMET)    AMB Referral to [facility] Pharm[facility]    2. Aortic  root dilatation (HCC)  I77.810 Basic Metabolic Panel (BMET)    3. Primary hypercoagulable state (HCC)  D68.59 Basic Metabolic Panel (BMET)    4. Type 2 diabetes mellitus without complication, without long-term current use of insulin (HCC)  E11.9 Basic Metabolic Panel  (BMET)    5. Class 3 severe obesity due to excess calories with serious comorbidity and body mass index (BMI) of 50.0 to 59.9 in adult [facility])  Z61.096 Basic Metabolic Panel (BMET)   Z68.43    E66.01       Meds ordered this encounter  Medications   spironolactone (ALDACTONE) 25 MG tablet    Sig: Take 1 tablet (25 mg total) by mouth daily.    Dispense:  30 tablet    Refill:  6   carvedilol (COREG) 6.25 MG tablet    Sig: Take 1 tablet (6.25 mg total) by mouth 2 (two) times daily.    Dispense:  60 tablet    Refill:  3    Dose increase    Medications Discontinued During This Encounter  Medication Reason   predniSONE (DELTASONE) 10 MG tablet Completed Course   carvedilol (COREG) 3.125 MG tablet      Assessment and plan:  Heart failure with reduced ejection fraction (HFrEF)   He has HFrEF with an ejection fraction of 40-45%, decreased from 45-50% in 2023. His heart function is compromised, with increased risk due to morbid obesity and uncontrolled diabetes. The goal is to optimize heart failure management and increase the ejection fraction to 60-65%. Increase carvedilol to 6.25 mg BID and start Spironolactone 25 mg daily. Unable to use Entresto or ACEi or ARB due to allergy and Angioedema. Refer to a pharmacist for medication management and monitoring and up titration of coreg. Schedule a BMP in 10 days. Follow up in 3 months. BMP in 2-4 weeks.  Aortic root dilation   He has a mildly dilated aortic root measuring 4.2 cm. Probably could be considered normal for his height and weight. Will continue to monitor with serial echo that needs to be done due to reduced LVEF also.   Hypertension   Hypertension is managed as part of heart failure treatment. Spironolactone is expected to aid in blood pressure control and improve cardiac function. If he tolerates aldactone, Rx 90 day supply. Side effects of mastodynia, gynecomastia discussed.   Type 2 Diabetes Mellitus   Diabetes is uncontrolled  with an A1c of 8.6%. He is on Trulicity and insulin but experienced severe side effects with Ozempic with ilius/gastroparesis. Increasing Trulicity and adding Jardiance, an SGLT2 inhibitor, were discussed for improved control and cardiovascular benefits. There is concern about SGLT2 inhibitors due to past due to Fournear's gangrene linked to Invokana. Weight loss is crucial, potentially achievable with Mounjaro and dietary adjustments. Discuss increasing Trulicity dose with endocrinologist. Consider starting Jardiance after evaluating fasciitis gangrene risk with endocrinologist.  Morbid Obesity   Morbid obesity significantly contributes to cardiovascular risk and complicates heart failure and diabetes management. Weight loss is essential, with potential reduction of 50 to 100 pounds through medication and dietary changes. Encourage lifestyle modifications for weight loss. Discuss weight management strategies with endocrinologist.  Follow-up   Close monitoring of heart failure and diabetes management is required, along with medication titration. Follow up with cardiologist in 3 months. Refer to a pharmacist for medication management and monitoring within 2 weeks. I will see him back in 3 months. Consider echo in 6-12 months.      Signed,  Yates Decamp, MD, [facility]  05/28/2023, 8:09 AM [facility] [address] [place], [place] [address] Phone: [phone]. Fax:  [phone]

## 2023-05-30 ENCOUNTER — Telehealth: Payer: Self-pay | Admitting: Pharmacist

## 2023-05-30 NOTE — Telephone Encounter (Signed)
 Patient wasn't able to make the April 2nd apt. I have moved him to April 15 at 8:30. This was my next available. Put patient in slot to hold. Will need to confirm with pt he can make it. LVM for pt to call back.

## 2023-06-06 NOTE — Telephone Encounter (Signed)
 I left detailed message for patient with apt date and time and asked him to call back if he couldn't come

## 2023-06-07 ENCOUNTER — Ambulatory Visit: Admitting: Pharmacist

## 2023-06-16 NOTE — Progress Notes (Unsigned)
 Patient ID: Chad Rivas                 DOB: 10/06/1965                      MRN: [id number]     HPI: Chad Rivas is a 58 y.o. male referred by Dr. Jacinto Halim to pharmacy clinic for HF medication management. PMH is significant for type 2 diabetes, morbid obesity, OSA compliant with CPAP, history of DVT after right hip replacement 15 years ago. Previous LVEF in June 2022 was 40-45% after DVT event, improved to 50-55% September 2022. Most recent LVEF 40-45% on September 2024.   Discussion with patient today included the following: cardiac medication indications, introduction to GDMT clinic, reasoning behind medication titration, importance of medication adherence, and patient engagement. At last visit with MD, discussed starting Jardiance, however patient has a history of Fournier's gangrene linked to Invokana use. Patient is unable to use RAASi due to hx angioedema on losartan. Spironolactone 25 mg daily was added and carvedilol was increased to 6.25 mg BID. BP in office 136/84, HR 91. Currently struggling with weight loss. BMP not yet collected.    Symptomatically, he is feeling ***, *** dizziness, lightheadedness, and fatigue. *** chest pain or palpitations. Feels SOB when ***. Able to complete all ADLs. Activity level ***. She *** checks her weight at home (normal range *** - *** lbs). *** LEE, PND, or orthopnea. Appetite has been ***. She *** adheres to a low-salt diet.   - jardiance? FG is not a true CI - doubtful retrialing will occur    Current CHF meds: carvedilol 6.25 mg BID, spironolactone 25 mg daily Previously tried: losartan (angioedema), Invokana (Fournier's gangrene) Adherence Assessment  Do you ever forget to take your medication? [] Yes [] No  Do you ever skip doses due to side effects? [] Yes [] No  Do you have trouble affording your medicines? [] Yes [] No  Are you ever unable to pick up your medication due to transportation difficulties? [] Yes [] No  Do you ever stop taking  your medications because you don't believe they are helping? [] Yes [] No  Do you check your weight daily? [] Yes [] No   Adherence strategy: ***  Barriers to obtaining medications: ***  BP goal: < 130/80 mmHg  Family History: none relevant   Social History: Never smoker, no smokeless tobacco use. No alcohol consumption, no illicit drug use.   Diet:   Exercise:   Home BP readings:   Wt Readings from Last 3 Encounters:  05/25/23 (!) 359 lb 3.2 oz (162.9 kg)  11/28/22 (!) 354 lb 0.9 oz (160.6 kg)  09/20/22 (!) 354 lb (160.6 kg)   BP Readings from Last 3 Encounters:  05/25/23 136/84  12/12/22 139/88  12/01/22 130/78   Pulse Readings from Last 3 Encounters:  05/25/23 91  12/12/22 96  12/01/22 90    Renal function: CrCl cannot be calculated (Patient's most recent lab result is older than the maximum 21 days allowed.).  Past Medical History:  Diagnosis Date   Diabetes mellitus without complication (HCC)    Heart failure with mildly reduced ejection fraction (HFmrEF) (HCC) 11/28/2022   History of pulmonary embolism 01/15/2022   Sleep apnea    pt has CPAP but does not use     Current Outpatient Medications on File Prior to Visit  Medication Sig Dispense Refill   apixaban (ELIQUIS) 5 MG TABS tablet Take 1 tablet (5 mg total) by mouth 2 (two) times daily.  60 tablet 3   atorvastatin (LIPITOR) 40 MG tablet Take 1 tablet (40 mg total) by mouth daily. 30 tablet 0   blood glucose meter kit and supplies KIT Dispense based on patient and insurance preference. Use up to four times daily as directed. (FOR ICD-9 250.00, 250.01). For QAC - HS accuchecks. 1 each 1   carvedilol (COREG) 6.25 MG tablet Take 1 tablet (6.25 mg total) by mouth 2 (two) times daily. 60 tablet 3   diphenhydrAMINE (BENADRYL) 25 MG tablet Take 25 mg by mouth every 6 (six) hours as needed for itching or allergies.     Dulaglutide 1.5 MG/0.5ML SOAJ Inject 1.5 mg into the skin once a week. thursdays     EPINEPHrine  (EPIPEN 2-PAK) 0.3 mg/0.3 mL IJ SOAJ injection Inject 0.3 mg into the muscle once as needed (For anaphylaxis.).     fluticasone (FLONASE) 50 MCG/ACT nasal spray Place 1 spray into both nostrils daily as needed for allergies.   3   glucose blood test strip Use four times daily as directed 100 each 0   HUMALOG KWIKPEN 200 UNIT/ML KwikPen CHECK BLOOD GLUCOSE: LESS THAN 150 THEN 0 UNITS, IF 151-200 THEN INJECT 3 UNITS, 201-250 INJECT 5 UNITS; 251-300 INJECT 7 UNITS; 301-350 INJECT 9 UNITS; 301-400 INJECT 11 UNITS. PATIENT TO CHECK HIS BLOOD GLUCOSE 3 TIMES A DAY BEFORE EACH MEAL.     insulin glargine (LANTUS SOLOSTAR) 100 UNIT/ML Solostar Pen Inject 0.3 mLs (30 Units total) into the skin at bedtime. (Patient taking differently: Inject 60 Units into the skin at bedtime.) 15 mL 0   Insulin Pen Needle 32G X 4 MM MISC Use for subcutaneous insulin 4 times daily. 100 each 0   Lancet Devices (AUTOLET MINI) MISC Use as directed 1 each 0   loratadine (CLARITIN) 10 MG tablet Take 10 mg by mouth daily.     Multiple Vitamins-Minerals (MULTIVITAMIN ADULT) TABS Take 1 tablet by mouth daily.     oxymetazoline (AFRIN) 0.05 % nasal spray Place 1 spray into both nostrils 2 (two) times daily as needed for congestion.     spironolactone (ALDACTONE) 25 MG tablet Take 1 tablet (25 mg total) by mouth daily. 30 tablet 6   TRUEplus Lancets 28G MISC Use as directed four times daily 100 each 0   Vitamin D, Ergocalciferol, (DRISDOL) 1.25 MG (50000 UNIT) CAPS capsule Take 50,000 Units by mouth once a week.     No current facility-administered medications on file prior to visit.    Allergies  Allergen Reactions   Invokana [Canagliflozin] Other (See Comments)    Hx of Fournier's gangrene    Losartan Swelling    Cause angioedema   Penicillins Anaphylaxis and Other (See Comments)    Has patient had a PCN reaction causing immediate rash, facial/tongue/throat swelling, SOB or lightheadedness with hypotension: Y Has patient had a  PCN reaction causing severe rash involving mucus membranes or skin necrosis: Y Has patient had a PCN reaction that required hospitalization: Y Has patient had a PCN reaction occurring within the last 10 years: N If all of the above answers are "NO", then may proceed with Cephalosporin use.   Pt states he had hives that required Benadryl treatment     Bactrim [Sulfamethoxazole-Trimethoprim] Hives   Penicillin G Procaine Hives     Assessment/Plan:  1. CHF -  No problem-specific Assessment & Plan notes found for this encounter.  @MTPCOMPLETEDLIST @  {f/u with PharmD:[id number]}  Thank you   Olene Floss, Pharm.D, BCACP, CPP  [facility] A Division of [facility] [address] [place], [place] [address]  Phone: [phone]; Fax: [phone]

## 2023-06-20 ENCOUNTER — Ambulatory Visit: Attending: Pharmacist | Admitting: Pharmacist

## 2023-06-21 ENCOUNTER — Encounter: Payer: Self-pay | Admitting: Pharmacist

## 2023-07-18 ENCOUNTER — Encounter: Payer: Self-pay | Admitting: Cardiology

## 2023-08-16 NOTE — Progress Notes (Deleted)
 Consider for the Hermes HF trial with [email or web address]

## 2023-08-25 ENCOUNTER — Ambulatory Visit: Admitting: Cardiology

## 2023-12-16 ENCOUNTER — Other Ambulatory Visit: Payer: Self-pay | Admitting: Cardiology

## 2024-01-12 ENCOUNTER — Inpatient Hospital Stay: Attending: Oncology | Admitting: Oncology

## 2024-01-12 ENCOUNTER — Encounter: Payer: Self-pay | Admitting: Oncology

## 2024-01-12 ENCOUNTER — Inpatient Hospital Stay

## 2024-01-12 VITALS — BP 111/63 | HR 77 | Temp 97.2°F | Resp 20 | Ht 70.5 in | Wt 350.6 lb

## 2024-01-12 DIAGNOSIS — E119 Type 2 diabetes mellitus without complications: Secondary | ICD-10-CM | POA: Diagnosis not present

## 2024-01-12 DIAGNOSIS — I5042 Chronic combined systolic (congestive) and diastolic (congestive) heart failure: Secondary | ICD-10-CM | POA: Insufficient documentation

## 2024-01-12 DIAGNOSIS — Z803 Family history of malignant neoplasm of breast: Secondary | ICD-10-CM | POA: Diagnosis not present

## 2024-01-12 DIAGNOSIS — K5900 Constipation, unspecified: Secondary | ICD-10-CM | POA: Diagnosis not present

## 2024-01-12 DIAGNOSIS — Z860100 Personal history of colon polyps, unspecified: Secondary | ICD-10-CM | POA: Insufficient documentation

## 2024-01-12 DIAGNOSIS — K648 Other hemorrhoids: Secondary | ICD-10-CM | POA: Diagnosis not present

## 2024-01-12 DIAGNOSIS — D509 Iron deficiency anemia, unspecified: Secondary | ICD-10-CM | POA: Diagnosis present

## 2024-01-12 DIAGNOSIS — K921 Melena: Secondary | ICD-10-CM | POA: Insufficient documentation

## 2024-01-12 DIAGNOSIS — K219 Gastro-esophageal reflux disease without esophagitis: Secondary | ICD-10-CM | POA: Diagnosis not present

## 2024-01-12 DIAGNOSIS — Z806 Family history of leukemia: Secondary | ICD-10-CM | POA: Insufficient documentation

## 2024-01-12 DIAGNOSIS — Z7901 Long term (current) use of anticoagulants: Secondary | ICD-10-CM | POA: Diagnosis not present

## 2024-01-12 DIAGNOSIS — Z86711 Personal history of pulmonary embolism: Secondary | ICD-10-CM | POA: Insufficient documentation

## 2024-01-12 DIAGNOSIS — M109 Gout, unspecified: Secondary | ICD-10-CM | POA: Diagnosis not present

## 2024-01-12 DIAGNOSIS — Z86718 Personal history of other venous thrombosis and embolism: Secondary | ICD-10-CM | POA: Insufficient documentation

## 2024-01-12 DIAGNOSIS — Z79899 Other long term (current) drug therapy: Secondary | ICD-10-CM | POA: Diagnosis not present

## 2024-01-12 DIAGNOSIS — Z8719 Personal history of other diseases of the digestive system: Secondary | ICD-10-CM | POA: Diagnosis not present

## 2024-01-12 DIAGNOSIS — Z882 Allergy status to sulfonamides status: Secondary | ICD-10-CM | POA: Insufficient documentation

## 2024-01-12 DIAGNOSIS — I10 Essential (primary) hypertension: Secondary | ICD-10-CM | POA: Insufficient documentation

## 2024-01-12 DIAGNOSIS — Z880 Allergy status to penicillin: Secondary | ICD-10-CM | POA: Diagnosis not present

## 2024-01-12 DIAGNOSIS — Z807 Family history of other malignant neoplasms of lymphoid, hematopoietic and related tissues: Secondary | ICD-10-CM | POA: Insufficient documentation

## 2024-01-12 DIAGNOSIS — G4730 Sleep apnea, unspecified: Secondary | ICD-10-CM | POA: Insufficient documentation

## 2024-01-12 LAB — CBC WITH DIFFERENTIAL (CANCER CENTER ONLY)
Abs Immature Granulocytes: 0.02 K/uL (ref 0.00–0.07)
Basophils Absolute: 0.1 K/uL (ref 0.0–0.1)
Basophils Relative: 1 %
Eosinophils Absolute: 0.2 K/uL (ref 0.0–0.5)
Eosinophils Relative: 2 %
HCT: 36.6 % — ABNORMAL LOW (ref 39.0–52.0)
Hemoglobin: 11.4 g/dL — ABNORMAL LOW (ref 13.0–17.0)
Immature Granulocytes: 0 %
Lymphocytes Relative: 32 %
Lymphs Abs: 2.7 K/uL (ref 0.7–4.0)
MCH: 24.1 pg — ABNORMAL LOW (ref 26.0–34.0)
MCHC: 31.1 g/dL (ref 30.0–36.0)
MCV: 77.2 fL — ABNORMAL LOW (ref 80.0–100.0)
Monocytes Absolute: 0.5 K/uL (ref 0.1–1.0)
Monocytes Relative: 6 %
Neutro Abs: 5 K/uL (ref 1.7–7.7)
Neutrophils Relative %: 59 %
Platelet Count: 339 K/uL (ref 150–400)
RBC: 4.74 MIL/uL (ref 4.22–5.81)
RDW: 14.8 % (ref 11.5–15.5)
WBC Count: 8.5 K/uL (ref 4.0–10.5)
nRBC: 0 % (ref 0.0–0.2)

## 2024-01-12 LAB — CMP (CANCER CENTER ONLY)
ALT: 12 U/L (ref 0–44)
AST: 15 U/L (ref 15–41)
Albumin: 3.6 g/dL (ref 3.5–5.0)
Alkaline Phosphatase: 65 U/L (ref 38–126)
Anion gap: 6 (ref 5–15)
BUN: 14 mg/dL (ref 6–20)
CO2: 30 mmol/L (ref 22–32)
Calcium: 8.7 mg/dL — ABNORMAL LOW (ref 8.9–10.3)
Chloride: 102 mmol/L (ref 98–111)
Creatinine: 1.01 mg/dL (ref 0.61–1.24)
GFR, Estimated: >60 mL/min (ref >60)
Glucose, Bld: 91 mg/dL (ref 70–99)
Potassium: 3.9 mmol/L (ref 3.5–5.1)
Sodium: 138 mmol/L (ref 135–145)
Total Bilirubin: 0.7 mg/dL (ref 0.0–1.2)
Total Protein: 8.2 g/dL — ABNORMAL HIGH (ref 6.5–8.1)

## 2024-01-12 LAB — IRON AND IRON BINDING CAPACITY (CC-WL,HP ONLY)
Iron: 27 ug/dL — ABNORMAL LOW (ref 45–182)
Saturation Ratios: 9 % — ABNORMAL LOW (ref 17.9–39.5)
TIBC: 302 ug/dL (ref 250–450)
UIBC: 275 ug/dL (ref 117–376)

## 2024-01-12 LAB — FERRITIN: Ferritin: 185 ng/mL (ref 24–336)

## 2024-01-12 NOTE — Progress Notes (Unsigned)
 [facility]  HEMATOLOGY CLINIC CONSULTATION NOTE   PATIENT NAME: Chad Rivas   MR#: [id number] DOB: Aug 02, 1965  DATE OF SERVICE: 01/12/2024  Patient Care Team: Charlott Dorn LABOR, MD as PCP - General (Internal Medicine) Ladona Heinz, MD as PCP - Cardiology (Cardiology) Braulio Hough, MD as Referring Physician (Internal Medicine)  REASON FOR CONSULTATION/ CHIEF COMPLAINT:  Evaluation of anemia.  ASSESSMENT & PLAN:   Chad Rivas is a 58 y.o. gentleman with a past medical history of type 2 diabetes mellitus, hypertension, sleep apnea, GERD, gout, left lower extremity DVT and bilateral pulmonary embolisms in June 2022, history of diastolic and systolic congestive heart failure, history of Fournier's gangrene from [place], was referred to our service for evaluation of iron  deficiency anemia.    No problem-specific Assessment & Plan notes found for this encounter.   Assessment and Plan Assessment & Plan Iron  deficiency anemia Chronic iron  deficiency anemia with decreased iron  saturation at 11% and iron  level at 39. Hemoglobin is at the lower limit of normal at 13. Microcytic anemia with small red cell size. Differential diagnosis includes genetic conditions such as sickle cell trait or alpha thalassemia. Possible contribution from hemorrhoidal bleeding and anticoagulation therapy with Eliquis . Family history of low iron  levels. Previous iron  supplements were not tolerated due to size and constipation. - Ordered labs to assess iron  levels and check for abnormal red cell production, including sickle cell trait and alpha thalassemia. - Discussed potential for iron  infusions if iron  levels are significantly low. - Scheduled follow-up appointment in 3-4 months.  History of venous thromboembolism (DVT and PE) on anticoagulation Venous thromboembolism with DVT and PE, currently on Eliquis  5 mg twice daily. No family history of blood clots. Previous clots occurred  post-travel and post-hip replacement surgery. Recent imaging showed no residual clots. Discussed potential adjustment of Eliquis  dosage to a prophylactic dose, pending cardiologist approval. - Continue Eliquis  5 mg twice daily. - Will discuss with cardiologist regarding potential adjustment of Eliquis  dosage to a prophylactic dose.  Internal hemorrhoids with intermittent bleeding Internal hemorrhoids with intermittent bleeding, potentially contributing to iron  deficiency anemia. Bleeding has increased over the past year. No other significant bleeding sources identified. - Continue to monitor hemorrhoidal bleeding and assess its contribution to anemia.   I reviewed lab results and outside records for this visit and discussed relevant results with the patient. Diagnosis, plan of care and treatment options were also discussed in detail with the patient. Opportunity provided to ask questions and answers provided to his apparent satisfaction. Provided instructions to call our clinic with any problems, questions or concerns prior to return visit. I recommended to continue follow-up with PCP and sub-specialists. He verbalized understanding and agreed with the plan. No barriers to learning was detected.  Emma Birchler, MD [facility] - A DEPT OF [facility] [address] [address] Dept: [phone] Dept Fax: [phone]  01/12/2024 10:53 AM  HISTORY OF PRESENT ILLNESS:  Discussed the use of AI scribe software for clinical note transcription with the patient, who gave verbal consent to proceed.  History of Present Illness Chad Rivas is a 58 year old male with anemia and iron  deficiency who presents for evaluation of his condition. He was referred by Dr. Charlott for evaluation of anemia and iron  deficiency.  He has a long-standing history of low iron  levels, with recent lab results showing decreased iron  saturation  at 11%  and iron  level at 39, which is below the normal range. His hemoglobin was 13, and his red blood cell size has been reported as small in prior labs. He has a history of internal hemorrhoids associated with intermittent bleeding, particularly over the last year. A colonoscopy in July revealed four benign polyps in the descending colon and one in the sigmoid colon, along with internal hemorrhoids.  He has a family history of low blood iron , as his mother also experiences this condition. His father had Hodgkin's lymphoma and leukemia, and his mother had breast cancer. He undergoes annual cancer screenings and recently had a prostate exam and enzyme testing.  He has a history of venous thromboembolism, with blood clots in the leg and lungs first occurring after a hip replacement and again after traveling to [place] . He has been on Eliquis  5 mg twice daily since 2022. No family history of blood clots is reported. A follow-up ultrasound in September last year showed no residual clots, and an angiogram of the chest and abdomen last year did not show any clots, although the timing of the contrast was slightly off.  No chest pain, dyspnea, or significant shortness of breath, attributing any mild shortness of breath to being out of shape. He reports occasional melena when not feeling well but denies any other bleeding such as epistaxis or gum bleeding. He experiences constipation with iron  supplements and has stopped taking them due to their size and side effects. He chews ice.  11/28/23 Iron  sat 11%, Iron  39. Hgb 13, MCV 75.7    MEDICAL HISTORY:  Past Medical History:  Diagnosis Date   Deep vein thrombosis (DVT) of left lower extremity (HCC) 08/2020   Diabetes mellitus without complication (HCC)    GERD (gastroesophageal reflux disease)    Heart failure with mildly reduced ejection fraction (HFmrEF) (HCC) 11/28/2022   History of Fournier's gangrene 07/2017   History of pulmonary embolism 01/15/2022    Hypertension    Sleep apnea    pt has CPAP but does not use     SURGICAL HISTORY: Past Surgical History:  Procedure Laterality Date   BIOPSY  09/20/2022   Procedure: BIOPSY;  Surgeon: Dianna Specking, MD;  Location: [facility];  Service: Gastroenterology;;   COLONOSCOPY WITH PROPOFOL  N/A 09/20/2022   Procedure: COLONOSCOPY WITH PROPOFOL ;  Surgeon: Dianna Specking, MD;  Location: [facility];  Service: Gastroenterology;  Laterality: N/A;   ESOPHAGOGASTRODUODENOSCOPY (EGD) WITH PROPOFOL  N/A 09/20/2022   Procedure: ESOPHAGOGASTRODUODENOSCOPY (EGD) WITH PROPOFOL ;  Surgeon: Dianna Specking, MD;  Location: [facility];  Service: Gastroenterology;  Laterality: N/A;   IRRIGATION AND DEBRIDEMENT ABSCESS Right 07/13/2017   Procedure: INCISION AND DRAINAGE OF GROIN ABSCESS;  Surgeon: Ottelin, Mark, MD;  Location: [facility];  Service: Urology;  Laterality: Right;   IRRIGATION AND DEBRIDEMENT ABSCESS N/A 01/15/2022   Procedure: IRRIGATION AND DEBRIDEMENT ABSCESS;  Surgeon: Carolee Sherwood JONETTA DOUGLAS, MD;  Location: [facility];  Service: Urology;  Laterality: N/A;   JOINT REPLACEMENT  2009   R hip   POLYPECTOMY  09/20/2022   Procedure: POLYPECTOMY;  Surgeon: Dianna Specking, MD;  Location: [facility];  Service: Gastroenterology;;   TOE SURGERY     TOTAL HIP ARTHROPLASTY      SOCIAL HISTORY: He reports that he has never smoked. He has never used smokeless tobacco. He reports that he does not drink alcohol and does not use drugs. Social History   Socioeconomic History   Marital status: Married    Spouse name: Not on file  Number of children: Not on file   Years of education: Not on file   Highest education level: Not on file  Occupational History   Not on file  Tobacco Use   Smoking status: Never   Smokeless tobacco: Never  Vaping Use   Vaping status: Never Used  Substance and Sexual Activity   Alcohol use: No   Drug use: No   Sexual activity: Not on file  Other Topics Concern   Not on  file  Social History Narrative   Not on file   Social Drivers of Health   Financial Resource Strain: Not on file  Food Insecurity: No Food Insecurity (01/08/2024)   Hunger Vital Sign    Worried About Running Out of Food in the Last Year: Never true    Ran Out of Food in the Last Year: Never true  Transportation Needs: No Transportation Needs (01/08/2024)   PRAPARE - Administrator, Civil Service (Medical): No    Lack of Transportation (Non-Medical): No  Physical Activity: Not on file  Stress: Not on file  Social Connections: Not on file  Intimate Partner Violence: Not At Risk (11/29/2022)   Humiliation, Afraid, Rape, and Kick questionnaire    Fear of Current or Ex-Partner: No    Emotionally Abused: No    Physically Abused: No    Sexually Abused: No    FAMILY HISTORY: Family History  Problem Relation Age of Onset   Cancer Father     ALLERGIES:  He is allergic to invokana [canagliflozin], losartan, penicillins, bactrim  [sulfamethoxazole -trimethoprim ], and penicillin g procaine.  MEDICATIONS:  Current Outpatient Medications  Medication Sig Dispense Refill   apixaban  (ELIQUIS ) 5 MG TABS tablet Take 1 tablet (5 mg total) by mouth 2 (two) times daily. 60 tablet 3   atorvastatin  (LIPITOR) 40 MG tablet Take 1 tablet (40 mg total) by mouth daily. 30 tablet 0   blood glucose meter kit and supplies KIT Dispense based on patient and insurance preference. Use up to four times daily as directed. (FOR ICD-9 250.00, 250.01). For QAC - HS accuchecks. 1 each 1   carvedilol  (COREG ) 6.25 MG tablet Take 1 tablet (6.25 mg total) by mouth 2 (two) times daily. 60 tablet 3   diphenhydrAMINE  (BENADRYL ) 25 MG tablet Take 25 mg by mouth every 6 (six) hours as needed for itching or allergies.     Dulaglutide 1.5 MG/0.5ML SOAJ Inject 1.5 mg into the skin once a week. thursdays     EPINEPHrine  (EPIPEN  2-PAK) 0.3 mg/0.3 mL IJ SOAJ injection Inject 0.3 mg into the muscle once as needed (For  anaphylaxis.).     eplerenone (INSPRA) 25 MG tablet Take 25 mg by mouth daily.     fluticasone (FLONASE) 50 MCG/ACT nasal spray Place 1 spray into both nostrils daily as needed for allergies.   3   glucose blood test strip Use four times daily as directed 100 each 0   HUMALOG  KWIKPEN 200 UNIT/ML KwikPen CHECK BLOOD GLUCOSE: LESS THAN 150 THEN 0 UNITS, IF 151-200 THEN INJECT 3 UNITS, 201-250 INJECT 5 UNITS; 251-300 INJECT 7 UNITS; 301-350 INJECT 9 UNITS; 301-400 INJECT 11 UNITS. PATIENT TO CHECK HIS BLOOD GLUCOSE 3 TIMES A DAY BEFORE EACH MEAL.     insulin  glargine (LANTUS  SOLOSTAR) 100 UNIT/ML Solostar Pen Inject 0.3 mLs (30 Units total) into the skin at bedtime. (Patient taking differently: Inject 60 Units into the skin at bedtime.) 15 mL 0   Insulin  Pen Needle 32G X 4 MM MISC  Use for subcutaneous insulin  4 times daily. 100 each 0   Lancet Devices (AUTOLET MINI) MISC Use as directed 1 each 0   loratadine  (CLARITIN ) 10 MG tablet Take 10 mg by mouth daily.     Multiple Vitamins-Minerals (MULTIVITAMIN ADULT) TABS Take 1 tablet by mouth daily.     oxymetazoline (AFRIN) 0.05 % nasal spray Place 1 spray into both nostrils 2 (two) times daily as needed for congestion.     TRUEplus Lancets 28G MISC Use as directed four times daily 100 each 0   Vitamin D, Ergocalciferol, (DRISDOL) 1.25 MG (50000 UNIT) CAPS capsule Take 50,000 Units by mouth once a week.     No current facility-administered medications for this visit.    REVIEW OF SYSTEMS:    Review of Systems - Oncology  All other pertinent systems were reviewed and were negative except as mentioned above.  PHYSICAL EXAMINATION:  ***  Onc Performance Status - 01/12/24 1031       ECOG Perf Status   ECOG Perf Status Restricted in physically strenuous activity but ambulatory and able to carry out work of a light or sedentary nature, e.g., light house work, office work      KPS SCALE   KPS % SCORE Normal activity with effort, some s/s of disease           Vitals:   01/12/24 1026 01/12/24 1028  BP: (!) 146/88 111/63  Pulse: 77   Resp: 20   Temp: (!) 97.2 F (36.2 C)   SpO2: 97%    Filed Weights   01/12/24 1026  Weight: (!) 350 lb 9.6 oz (159 kg)    Physical Exam  ***  LABORATORY DATA:   I have reviewed the data as listed.  Results for orders placed or performed in visit on 01/12/24  Alpha-Thalassemia Analysis  Result Value Ref Range   Source CRE BLOOD    Specimen Type Comment    Indication Chronic microcytic anemia    Indication Comment    Result Comment (A)    Interpretation Comment    Recommendations Comment    Clinical Information Comment    Comments Comment    Methods/Limitations Comment    References Comment    Genes Analyzed Comment    Director Review/Release Comment    IMAGE .   Hgb Fractionation Cascade  Result Value Ref Range   Hgb F Reflexed 0.0 - 2.0 %   Hgb A Reflexed 96.4 - 98.8 %   Hgb A2 2.0 1.8 - 3.2 %   Hgb S Reflexed 0.0 %   Interpretation, Hgb Fract Comment   Ferritin  Result Value Ref Range   Ferritin 185 24 - 336 ng/mL  Iron  and Iron  Binding Capacity ([facility] only)  Result Value Ref Range   Iron  27 (L) 45 - 182 ug/dL   TIBC 697 749 - 549 ug/dL   Saturation Ratios 9 (L) 17.9 - 39.5 %   UIBC 275 117 - 376 ug/dL  CMP (Cancer Center only)  Result Value Ref Range   Sodium 138 135 - 145 mmol/L   Potassium 3.9 3.5 - 5.1 mmol/L   Chloride 102 98 - 111 mmol/L   CO2 30 22 - 32 mmol/L   Glucose, Bld 91 70 - 99 mg/dL   BUN 14 6 - 20 mg/dL   Creatinine 8.98 9.38 - 1.24 mg/dL   Calcium  8.7 (L) 8.9 - 10.3 mg/dL   Total Protein 8.2 (H) 6.5 - 8.1 g/dL   Albumin 3.6  3.5 - 5.0 g/dL   AST 15 15 - 41 U/L   ALT 12 0 - 44 U/L   Alkaline Phosphatase 65 38 - 126 U/L   Total Bilirubin 0.7 0.0 - 1.2 mg/dL   GFR, Estimated >39 >39 mL/min   Anion gap 6 5 - 15  CBC with Differential (Cancer Center Only)  Result Value Ref Range   WBC Count 8.5 4.0 - 10.5 K/uL   RBC 4.74 4.22 - 5.81  MIL/uL   Hemoglobin 11.4 (L) 13.0 - 17.0 g/dL   HCT 63.3 (L) 60.9 - 47.9 %   MCV 77.2 (L) 80.0 - 100.0 fL   MCH 24.1 (L) 26.0 - 34.0 pg   MCHC 31.1 30.0 - 36.0 g/dL   RDW 85.1 88.4 - 84.4 %   Platelet Count 339 150 - 400 K/uL   nRBC 0.0 0.0 - 0.2 %   Neutrophils Relative % 59 %   Neutro Abs 5.0 1.7 - 7.7 K/uL   Lymphocytes Relative 32 %   Lymphs Abs 2.7 0.7 - 4.0 K/uL   Monocytes Relative 6 %   Monocytes Absolute 0.5 0.1 - 1.0 K/uL   Eosinophils Relative 2 %   Eosinophils Absolute 0.2 0.0 - 0.5 K/uL   Basophils Relative 1 %   Basophils Absolute 0.1 0.0 - 0.1 K/uL   Immature Granulocytes 0 %   Abs Immature Granulocytes 0.02 0.00 - 0.07 K/uL  Hgb Fractionation by HPLC  Result Value Ref Range   Hgb F 0.0 0.0 - 2.0 %   Hgb A 98.0 96.4 - 98.8 %   Hgb A2 Comment 1.8 - 3.2 %   Hgb S 0.0 0.0 %   Hgb C 0.0 0.0 %   Hgb E 0.0 0.0 %   Hgb Variant 0.0 0.0 %   Final Interpretation: Comment        RADIOGRAPHIC STUDIES:  No recent pertinent imaging studies available to review.  Orders Placed This Encounter  Procedures   CBC with Differential (Cancer Center Only)    Standing Status:   Future    Expiration Date:   01/11/2025   CMP (Cancer Center only)    Standing Status:   Future    Expiration Date:   01/11/2025   Iron  and Iron  Binding Capacity ([facility] only)    Standing Status:   Future    Expiration Date:   01/11/2025   Ferritin    Standing Status:   Future    Expiration Date:   01/11/2025   Hgb Fractionation Cascade    Standing Status:   Future    Expiration Date:   01/11/2025   Alpha-Thalassemia Analysis    Standing Status:   Future    Expiration Date:   01/11/2025    Indication:   Chronic microcytic anemia- concern for alpha thalassemia    No future appointments.    I spent a total of *** minutes during this encounter with the patient including review of chart and various tests results, discussions about plan of care and coordination of care plan.  This document was  completed utilizing speech recognition software. Grammatical errors, random word insertions, pronoun errors, and incomplete sentences are an occasional consequence of this system due to software limitations, ambient noise, and hardware issues. Any formal questions or concerns about the content, text or information contained within the body of this dictation should be directly addressed to the provider for clarification.

## 2024-01-15 LAB — HGB FRACTIONATION CASCADE: Hgb A2: 2 % (ref 1.8–3.2)

## 2024-01-15 LAB — HGB FRACTIONATION BY HPLC
Hgb A: 98 % (ref 96.4–98.8)
Hgb C: 0 %
Hgb E: 0 %
Hgb F: 0 % (ref 0.0–2.0)
Hgb S: 0 %
Hgb Variant: 0 %

## 2024-01-23 LAB — ALPHA-THALASSEMIA ANALYSIS: IMAGE: 0

## 2024-01-26 ENCOUNTER — Inpatient Hospital Stay (HOSPITAL_BASED_OUTPATIENT_CLINIC_OR_DEPARTMENT_OTHER): Admitting: Oncology

## 2024-01-26 DIAGNOSIS — D509 Iron deficiency anemia, unspecified: Secondary | ICD-10-CM

## 2024-01-29 ENCOUNTER — Telehealth: Payer: Self-pay | Admitting: Oncology

## 2024-01-29 NOTE — Progress Notes (Signed)
 We could not complete phone visit today as there was no answer despite multiple attempts.

## 2024-01-29 NOTE — Telephone Encounter (Signed)
 I left voicemail for patient to return my call if time/date does not work for him on 02/16/2024 for telephone visit with Dr. Autumn.

## 2024-01-30 ENCOUNTER — Encounter: Payer: Self-pay | Admitting: Oncology

## 2024-01-30 NOTE — Assessment & Plan Note (Signed)
 Chronic iron  deficiency anemia with decreased iron  saturation at 11% and iron  level at 39. Hemoglobin is at the lower limit of normal at 13. Microcytic anemia with small red cell size.   Differential diagnosis includes genetic conditions such as sickle cell trait or alpha thalassemia. Possible contribution from hemorrhoidal bleeding and anticoagulation therapy with Eliquis .   Family history of low iron  levels.   Previous iron  supplements were not tolerated due to size of the pill and constipation.  Labs today revealed hemoglobin of 11.4, hematocrit 36.6, MCV thousand 0.2.  White count and platelet count are within normal limits.  CMP grossly unremarkable.  Iron  studies continue to show evidence of iron  deficiency with iron  saturation of 9%, iron  decreased at 27.  Ferritin normal at 185.  - Ordered labs to assess iron  levels and check for abnormal red cell production, including sickle cell trait and alpha thalassemia. - Discussed potential for iron  infusions if iron  levels are significantly low. -I will call him in 2 to 3 weeks to discuss results and finalize on plan of care. - Scheduled follow-up appointment in 3-4 months.

## 2024-02-16 ENCOUNTER — Other Ambulatory Visit (HOSPITAL_COMMUNITY): Payer: Self-pay | Admitting: Oncology

## 2024-02-16 ENCOUNTER — Encounter: Payer: Self-pay | Admitting: Oncology

## 2024-02-16 ENCOUNTER — Inpatient Hospital Stay: Attending: Oncology | Admitting: Oncology

## 2024-02-16 ENCOUNTER — Other Ambulatory Visit: Payer: Self-pay | Admitting: Oncology

## 2024-02-16 DIAGNOSIS — D563 Thalassemia minor: Secondary | ICD-10-CM | POA: Diagnosis not present

## 2024-02-16 DIAGNOSIS — D509 Iron deficiency anemia, unspecified: Secondary | ICD-10-CM | POA: Diagnosis not present

## 2024-02-16 NOTE — Assessment & Plan Note (Signed)
 Alpha thalassemia carrier state confirmed by blood testing. This is a mild, asymptomatic condition that may contribute to anemia but does not typically cause significant health problems. - Provided education regarding the benign nature of the alpha thalassemia carrier state.

## 2024-02-16 NOTE — Progress Notes (Signed)
 [facility]  HEMATOLOGY-ONCOLOGY ELECTRONIC VISIT PROGRESS NOTE  PATIENT NAME: Chad Rivas   MR#: [id number] DOB: 1965-12-04  DATE OF SERVICE: 02/16/2024  Patient Care Team: Charlott Dorn LABOR, MD as PCP - General (Internal Medicine) Ladona Heinz, MD as PCP - Cardiology (Cardiology) Braulio Hough, MD as Referring Physician (Internal Medicine)  I connected with the patient via telephone conference and verified that I am speaking with the correct person using two identifiers. The patient's location is at home and I am providing care from the [facility].  I discussed the limitations, risks, security and privacy concerns of performing an evaluation and management service by e-visits and the availability of in person appointments. I also discussed with the patient that there may be a patient responsible charge related to this service. The patient expressed understanding and agreed to proceed.   ASSESSMENT & PLAN:   GEOVANIE Rivas is a 58 y.o. gentleman with a past medical history of type 2 diabetes mellitus, hypertension, sleep apnea, GERD, gout, left lower extremity DVT and bilateral pulmonary embolisms in June 2022, history of diastolic and systolic congestive heart failure, history of Fournier's gangrene from [place], was referred to our service in November 2025 for evaluation of iron  deficiency anemia.  Workup also showed evidence of alpha thalassemia trait.  Microcytic anemia Chronic iron  deficiency anemia with decreased iron  saturation at 11% and iron  level at 39. Hemoglobin is at the lower limit of normal at 13. Microcytic anemia with small red cell size.   Previous iron  supplements were not tolerated due to size of the pill and constipation.  On his consultation with us  on 01/12/2024, labs revealed hemoglobin of 11.4, hematocrit 36.6, MCV decreased at 77.2.  White count and platelet count were within normal limits.  CMP was grossly unremarkable.  Iron  studies  indicated persistent iron  deficiency with iron  saturation of 9%, iron  decreased to 27.  Ferritin was normal at 185.  Hemoglobin electrophoresis showed no variant hemoglobin.  Alpha thalassemia analysis did show heterozygosity for the -alpha3.7 deletion. (-alpha/alpha alpha).  Indicative of silent carrier of alpha thalassemia.  For his iron  deficiency anemia, proposed IV iron .  Plan is to proceed with Venofer weekly x 5 doses.  - Scheduled follow-up appointment in 3-4 months.  Alpha thalassemia trait Alpha thalassemia carrier state confirmed by blood testing. This is a mild, asymptomatic condition that may contribute to anemia but does not typically cause significant health problems. - Provided education regarding the benign nature of the alpha thalassemia carrier state.     I discussed the assessment and treatment plan with the patient. The patient was provided an opportunity to ask questions and all were answered. The patient agreed with the plan and demonstrated an understanding of the instructions. The patient was advised to call back or seek an in-person evaluation if the symptoms worsen or if the condition fails to improve as anticipated.    I spent 12 minutes over the phone with the patient reviewing test results, discuss management and coordination/planning of care.  Chinita Patten, MD 02/16/2024 8:17 PM [facility] - A DEPT [facility] [address] [place] [address] Dept: [phone] Dept Fax: [phone]   INTERVAL HISTORY:  Please see above for problem oriented charting.  The purpose of today's discussion is to explain recent lab results and to formulate plan of care.  Discussed the use of AI scribe software for clinical note transcription  with the patient, who gave verbal consent to proceed.  History of Present Illness DEANGLEO Rivas is a 58 year old male with persistent iron  deficiency anemia and  alpha thalassemia carrier state who presents for hematology follow-up regarding ongoing anemia.  He was initially evaluated for anemia one month ago. Recent laboratory studies show persistent anemia with hemoglobin 11.4 g/dL, microcytosis, and low iron  levels. Anemia persists despite intermittent use of oral iron  supplementation.  He takes oral iron  supplements irregularly due to gastrointestinal side effects, including constipation and hard stools, resulting in sporadic adherence. He denies additional symptoms and otherwise feels well.  Prior laboratory evaluation showed alpha thalassemia carrier state. He expresses concern about this finding. Intravenous iron  therapy was discussed.    SUMMARY OF HEMATOLOGY HISTORY:  He was referred by Dr. Charlott for evaluation of anemia and iron  deficiency.   On 11/28/23: Iron  sat 11%, Iron  39. Hgb 13, MCV 75.7   He has a long-standing history of low iron  levels, with recent lab results showing decreased iron  saturation at 11% and iron  level at 39, which is below the normal range. His hemoglobin was 13, and his red blood cell size has been reported as small in prior labs. He has a history of internal hemorrhoids associated with intermittent bleeding, particularly over the last year. A colonoscopy in July revealed four benign polyps in the descending colon and one in the sigmoid colon, along with internal hemorrhoids.   He has a family history of low blood iron , as his mother also experiences this condition. His father had Hodgkin's lymphoma and leukemia, and his mother had breast cancer. He undergoes annual cancer screenings and recently had a prostate exam and enzyme testing.   He has a history of venous thromboembolism, with blood clots in the leg and lungs first occurring after a hip replacement and again after traveling to [place] . He has been on Eliquis  5 mg twice daily since 2022. No family history of blood clots is reported. A follow-up ultrasound in  September last year showed no residual clots, and an angiogram of the chest and abdomen last year did not show any clots, although the timing of the contrast was slightly off.   No chest pain, dyspnea, or significant shortness of breath, attributing any mild shortness of breath to being out of shape. He reports occasional melena when not feeling well but denies any other bleeding such as epistaxis or gum bleeding. He experiences constipation with iron  supplements and has stopped taking them due to their size and side effects. He chews ice.  On his consultation with us  on 01/12/2024, labs revealed hemoglobin of 11.4, hematocrit 36.6, MCV decreased at 77.2.  White count and platelet count were within normal limits.  CMP was grossly unremarkable.  Iron  studies indicated persistent iron  deficiency with iron  saturation of 9%, iron  decreased to 27.  Ferritin was normal at 185.  Hemoglobin electrophoresis showed no variant hemoglobin.  Alpha thalassemia analysis did show heterozygosity for the -alpha3.7 deletion. (-alpha/alpha alpha).  Indicative of silent carrier of alpha thalassemia.  For his iron  deficiency anemia, proposed IV iron .  Plan is to proceed with Venofer weekly x 5 doses.  REVIEW OF SYSTEMS:    Review of Systems - Oncology  All other pertinent systems were reviewed with the patient and are negative.  I have reviewed the past medical history, past surgical history, social history and family history with the patient and they are unchanged from previous note.  ALLERGIES:  He is allergic to invokana [canagliflozin],  losartan, penicillins, bactrim  [sulfamethoxazole -trimethoprim ], and penicillin g procaine.  MEDICATIONS:  Current Outpatient Medications  Medication Sig Dispense Refill   apixaban  (ELIQUIS ) 5 MG TABS tablet Take 1 tablet (5 mg total) by mouth 2 (two) times daily. 60 tablet 3   atorvastatin  (LIPITOR) 40 MG tablet Take 1 tablet (40 mg total) by mouth daily. 30 tablet 0   blood  glucose meter kit and supplies KIT Dispense based on patient and insurance preference. Use up to four times daily as directed. (FOR ICD-9 250.00, 250.01). For QAC - HS accuchecks. 1 each 1   carvedilol  (COREG ) 6.25 MG tablet Take 1 tablet (6.25 mg total) by mouth 2 (two) times daily. 60 tablet 3   diphenhydrAMINE  (BENADRYL ) 25 MG tablet Take 25 mg by mouth every 6 (six) hours as needed for itching or allergies.     Dulaglutide 1.5 MG/0.5ML SOAJ Inject 1.5 mg into the skin once a week. thursdays     EPINEPHrine  (EPIPEN  2-PAK) 0.3 mg/0.3 mL IJ SOAJ injection Inject 0.3 mg into the muscle once as needed (For anaphylaxis.).     eplerenone (INSPRA) 25 MG tablet Take 25 mg by mouth daily.     fluticasone (FLONASE) 50 MCG/ACT nasal spray Place 1 spray into both nostrils daily as needed for allergies.   3   glucose blood test strip Use four times daily as directed 100 each 0   HUMALOG  KWIKPEN 200 UNIT/ML KwikPen CHECK BLOOD GLUCOSE: LESS THAN 150 THEN 0 UNITS, IF 151-200 THEN INJECT 3 UNITS, 201-250 INJECT 5 UNITS; 251-300 INJECT 7 UNITS; 301-350 INJECT 9 UNITS; 301-400 INJECT 11 UNITS. PATIENT TO CHECK HIS BLOOD GLUCOSE 3 TIMES A DAY BEFORE EACH MEAL.     insulin  glargine (LANTUS  SOLOSTAR) 100 UNIT/ML Solostar Pen Inject 0.3 mLs (30 Units total) into the skin at bedtime. (Patient taking differently: Inject 60 Units into the skin at bedtime.) 15 mL 0   Insulin  Pen Needle 32G X 4 MM MISC Use for subcutaneous insulin  4 times daily. 100 each 0   Lancet Devices (AUTOLET MINI) MISC Use as directed 1 each 0   loratadine  (CLARITIN ) 10 MG tablet Take 10 mg by mouth daily.     Multiple Vitamins-Minerals (MULTIVITAMIN ADULT) TABS Take 1 tablet by mouth daily.     oxymetazoline (AFRIN) 0.05 % nasal spray Place 1 spray into both nostrils 2 (two) times daily as needed for congestion.     TRUEplus Lancets 28G MISC Use as directed four times daily 100 each 0   Vitamin D, Ergocalciferol, (DRISDOL) 1.25 MG (50000 UNIT) CAPS  capsule Take 50,000 Units by mouth once a week.     No current facility-administered medications for this visit.    PHYSICAL EXAMINATION:  Not performed today as it was a phone only visit  LABORATORY DATA:   I have reviewed the data as listed.  Recent Results (from the past 2160 hours)  Alpha-Thalassemia Analysis     Status: Abnormal   Collection Time: 01/12/24 10:56 AM  Result Value Ref Range   Source CRE BLOOD    Specimen Type Comment     Comment: Whole Blood   Indication Chronic microcytic anemia     Comment:  concern for alpha thalassemia Performed at [facility], [address] [place], [place] [address]    Indication Comment     Comment: Not Provided   Result Comment (A)     Comment: POSITIVE   Interpretation Comment     Comment: (NOTE) Variants Detected  Disorders (Gene)     Result          Interpretation Alpha-thalassemia    POSITIVE :      Predicted to be a HBA1/HBA2 16p13.3    CARRIER:        silent carrier of                     Heterozygous    alpha-thalassemia.                     for the         Genetic counseling                     -alpha3.7       is recommended.                     deletion.                     (-alpha/alpha                     alpha)    Recommendations Comment     Comment: (NOTE) If the above result is positive, genetic counseling is recommended to discuss the potential clinical and/or reproductive implications, as well as recommendations for testing family members and, when applicable, this individual's partner. Genetic counseling services are available. To access [facility] please visit [email or web address] or call [phone] [email or web address] [phone]).    Clinical Information Comment     Comment: (NOTE) Alpha-thalassemia, a disorder with variable severity, is usually inherited in an autosomal recessive manner. Individuals with alpha-thalassemia have a  deficiency in the production of hemoglobin, a protein that carries oxygen in the blood. Silent carriers of alpha-thalassemia are not expected to have related health problems. Individuals with alpha-thalassemia trait may have symptoms of mild anemia. The two clinically significant forms of alpha thalassemia are HbH disease and Hb Bart hydrops fetalis syndrome. Signs and symptoms of the less severe HbH disease usually appear in early childhood and may include mild to moderate hemolytic anemia, hepatosplenomegaly, mild jaundice, and bone changes. Signs and symptoms of the more severe Hb Bart hydrops fetalis syndrome appear before birth and may include generalized edema, severe hypochromic anemia, hepatosplenomegaly, heart problems, and genitourinary abnormalities. Mothers of babies affected with Hb Bart hydrops feta lis syndrome may experience serious complications, including preeclampsia, premature delivery, or abnormal bleeding during pregnancy. Co-inheritance of alpha-thalassemia and other hemoglobinopathies, such as beta-thalassemia or sickle cell disease, may modify symptoms. In utero stem cell transplantation or fetal blood transfusions may be available for affected fetuses. Without treatment, most babies with Hb Bart hydrops fetalis syndrome are stillborn or die soon after birth. Individuals with HbH disease may survive to adulthood. Treatment is otherwise supportive and may include red blood cell transfusions. (EFPI:[id number]).    Comments Comment     Comment: (NOTE) This interpretation is based on the clinical information provided and the current understanding of the molecular genetics of the disorder(s) tested.    Methods/Limitations Comment     Comment: (NOTE) Alpha thalassemia: Analysis of the alpha-globin (HBA) gene cluster is performed by multiplex ligation-dependent probe amplification (MLPA). Agarose gel electrophoresis and Sanger sequencing are used to distinguish  or confirm variants that cannot be resolved by MLPA. There are two alpha-globin genes in the HBA gene cluster, HBA1 and HBA2. Typically, an individual with a normal genotype has these two genes on each chromosome (alpha  alpha/alpha alpha). A deletion that removes two of the genes on one of the chromosomes is described as - -/alpha alpha. Alpha-globin variants included in the analysis are the [place] non-deletion variant and the following deletions: -alpha3.7, -alpha4.2, --alpha20.5, --SEA, --FIL, --THAI, --MED, and the HS-40 regulatory region. This analysis does not detect other variants in the alpha-globin genes or variants in the beta-globin gene and may not detect the co-occurrence of a deletion and a duplication. Analytical sensitivity is  estimated to be >98% for the targeted variants. Limitations: Technologies used do not detect germline mosaicism and do not rule out the presence of large chromosomal aberrations including rearrangements and gene fusions, or variants in regions or genes not included in this test, or possible inter/intragenic interactions between variants, or repeat expansions. Variant classification and/or interpretation may change over time if more information becomes available. False positive or false negative results may occur for reasons that include: rare genetic variants, sex chromosome abnormalities, pseudogene interference, blood transfusions, bone marrow transplantation, somatic or tissue-specific mosaicism, mislabeled samples, or erroneous representation of family relationships. This test was developed and its performance characteristics determined by [facility]. It has not been cleared or approved by the Food and Drug Administration. This test was developed and its performance  characteristics determined by [facility]. It has not been cleared or approved by the Food and Drug Administration.    References Comment     Comment: (NOTE) Tamary H,  Dgany O. Alpha-Thalassemia. 2005 Nov 1 [Updated 2020 Oct 1]. In: Juliene POSNER, Jerrell JINNY Rhett CAFFIE, et al., editors. GeneReviews(R) [Internet]. PMID: [id number]    Genes Analyzed Comment     Comment: HBA1/HBA2   Director Review/Release Comment     Comment: (NOTE) Component Type       Performed At        Laboratory                                         Director Technical            Laboratory          Aniceto Christen, component,           Corporation of      MD, PhD processing           [place], [address],                     [place], [place],                     [id number] Technical            Laboratory          Aniceto Christen, component,           Corporation of      MD, PhD analysis             [place], [address]                     [place], [place],  [id number] Professional         [id number],            Aniceto Christen, component            Laboratory          MD, PhD                     Corporation of                     [place], [place] [address],                     [place], [place],                     [id number] Electronically released by Slater Donate, PhD, [facility]    IMAGE .     Comment: (NOTE) Performed At: [facility] [place] [address] [place], [place] [id number] Christen Aniceto MDPhD Ey:[id number]   Hgb Fractionation Cascade     Status: None   Collection Time: 01/12/24 10:56 AM  Result Value Ref Range   Hgb F Reflexed 0.0 - 2.0 %   Hgb A Reflexed 96.4 - 98.8 %   Hgb A2 2.0 1.8 - 3.2 %   Hgb S Reflexed 0.0 %   Interpretation, Hgb Fract Comment     Comment: (NOTE) Capillary Electrophoresis (CE) performed. Reflex to High-pressure liquid chromatography (HPLC) method for confirmation. Performed At: [facility] [address] [place], [place] [id number] Jennette Shorter MD Ey:[id number]   Iron  and Iron  Binding Capacity ([facility] only)     Status: Abnormal   Collection Time: 01/12/24 10:56 AM   Result Value Ref Range   Iron  27 (L) 45 - 182 ug/dL   TIBC 697 749 - 549 ug/dL   Saturation Ratios 9 (L) 17.9 - 39.5 %   UIBC 275 117 - 376 ug/dL    Comment: Performed at [facility], [address], [place], [place] [address]  CMP (Cancer Center only)     Status: Abnormal   Collection Time: 01/12/24 10:56 AM  Result Value Ref Range   Sodium 138 135 - 145 mmol/L   Potassium 3.9 3.5 - 5.1 mmol/L   Chloride 102 98 - 111 mmol/L   CO2 30 22 - 32 mmol/L    Comment: (NOTE) Elevated LDH levels may cause falsely increased CO2 results. If LDH is >2000 U/L, a positive bias of 12% is possible.     Glucose, Bld 91 70 - 99 mg/dL    Comment: Glucose reference range applies only to samples taken after fasting for at least 8 hours.   BUN 14 6 - 20 mg/dL   Creatinine 8.98 9.38 - 1.24 mg/dL   Calcium  8.7 (L) 8.9 - 10.3 mg/dL   Total Protein 8.2 (H) 6.5 - 8.1 g/dL   Albumin 3.6 3.5 - 5.0 g/dL   AST 15 15 - 41 U/L   ALT 12 0 - 44 U/L   Alkaline Phosphatase 65 38 - 126 U/L   Total Bilirubin 0.7 0.0 - 1.2 mg/dL   GFR, Estimated >39 >39 mL/min    Comment: (NOTE) Calculated using the CKD-EPI Creatinine Equation (2021)    Anion gap 6 5 - 15    Comment: Performed at [facility], [address]  [place], [place] [address]  CBC with Differential (Cancer Center Only)     Status: Abnormal   Collection Time: 01/12/24 10:56 AM  Result Value Ref Range   WBC Count 8.5 4.0 - 10.5 K/uL   RBC 4.74 4.22 - 5.81 MIL/uL   Hemoglobin 11.4 (L) 13.0 - 17.0 g/dL   HCT 63.3 (L) 60.9 - 47.9 %   MCV 77.2 (L) 80.0 - 100.0 fL   MCH 24.1 (L) 26.0 - 34.0 pg   MCHC 31.1 30.0 - 36.0 g/dL   RDW 85.1 88.4 - 84.4 %   Platelet Count 339 150 - 400 K/uL   nRBC 0.0 0.0 - 0.2 %   Neutrophils Relative % 59 %   Neutro Abs 5.0 1.7 - 7.7 K/uL   Lymphocytes Relative 32 %   Lymphs Abs 2.7 0.7 - 4.0 K/uL   Monocytes Relative 6 %   Monocytes Absolute 0.5 0.1 - 1.0 K/uL    Eosinophils Relative 2 %   Eosinophils Absolute 0.2 0.0 - 0.5 K/uL   Basophils Relative 1 %   Basophils Absolute 0.1 0.0 - 0.1 K/uL   Immature Granulocytes 0 %   Abs Immature Granulocytes 0.02 0.00 - 0.07 K/uL    Comment: Performed at [facility], [address] [place], [place] [address]  Hgb Fractionation by HPLC     Status: None   Collection Time: 01/12/24 10:56 AM  Result Value Ref Range   Hgb F 0.0 0.0 - 2.0 %   Hgb A 98.0 96.4 - 98.8 %   Hgb A2 Comment 1.8 - 3.2 %    Comment: See Capillary Electrophoresis (CE) result.   Hgb S 0.0 0.0 %   Hgb C 0.0 0.0 %   Hgb E 0.0 0.0 %   Hgb Variant 0.0 0.0 %   Final Interpretation: Comment     Comment: (NOTE) Normal hemoglobin present; no hemoglobin variant or beta thalassemia identified. Note: Alpha thalassemia may not be detected by the Hgb Fractionation Cascade panel. If alpha thalassemia is suspected, [facility] offers Alpha-Thalassemia Analysis [phone]). Performed At: [facility] [address] [place], [place] [id number] Jennette Shorter MD Ey:[id number]   Ferritin     Status: None   Collection Time: 01/12/24 10:58 AM  Result Value Ref Range   Ferritin 185 24 - 336 ng/mL    Comment: Performed at [facility], [address], [place], [place] [address]     RADIOGRAPHIC STUDIES:  No recent pertinent imaging studies available to review.  No orders of the defined types were placed in this encounter.    Future Appointments  Date Time Provider Department Center  05/10/2024 11:15 AM [facility] None  05/10/2024 11:45 AM Talani Brazee, Chinita, MD [facility] None    This document was completed utilizing speech recognition software. Grammatical errors, random word insertions, pronoun errors, and incomplete sentences are an occasional consequence of this system due to software limitations, ambient noise, and hardware issues. Any formal questions or concerns about the  content, text or information contained within the body of this dictation should be directly addressed to the provider for clarification.

## 2024-02-16 NOTE — Assessment & Plan Note (Signed)
 Chronic iron  deficiency anemia with decreased iron  saturation at 11% and iron  level at 39. Hemoglobin is at the lower limit of normal at 13. Microcytic anemia with small red cell size.   Previous iron  supplements were not tolerated due to size of the pill and constipation.  On his consultation with us  on 01/12/2024, labs revealed hemoglobin of 11.4, hematocrit 36.6, MCV decreased at 77.2.  White count and platelet count were within normal limits.  CMP was grossly unremarkable.  Iron  studies indicated persistent iron  deficiency with iron  saturation of 9%, iron  decreased to 27.  Ferritin was normal at 185.  Hemoglobin electrophoresis showed no variant hemoglobin.  Alpha thalassemia analysis did show heterozygosity for the -alpha3.7 deletion. (-alpha/alpha alpha).  Indicative of silent carrier of alpha thalassemia.  For his iron  deficiency anemia, proposed IV iron .  Plan is to proceed with Venofer weekly x 5 doses.  - Scheduled follow-up appointment in 3-4 months.

## 2024-02-19 ENCOUNTER — Telehealth: Payer: Self-pay

## 2024-02-19 NOTE — Telephone Encounter (Signed)
 Dr. Pasam, patient will be scheduled as soon as possible.  Auth Submission: NO AUTH NEEDED Site of care: Site of care: [facility] Payer: [employer] commercial Medication & CPT/J Code(s) submitted: Venofer (Iron  Sucrose) J1756 Diagnosis Code:  Route of submission (phone, fax, portal):  Phone # Fax # Auth type: Buy/Bill PB Units/visits requested: 200mg  x 5 doses Reference number:  Approval from: 02/19/24 to 03/06/24

## 2024-02-26 ENCOUNTER — Ambulatory Visit

## 2024-03-06 ENCOUNTER — Ambulatory Visit (INDEPENDENT_AMBULATORY_CARE_PROVIDER_SITE_OTHER)

## 2024-03-06 VITALS — BP 138/87 | HR 72 | Temp 97.7°F | Resp 16 | Ht 70.0 in | Wt 348.2 lb

## 2024-03-06 DIAGNOSIS — D509 Iron deficiency anemia, unspecified: Secondary | ICD-10-CM

## 2024-03-06 MED ORDER — ACETAMINOPHEN 325 MG PO TABS
650.0000 mg | ORAL_TABLET | Freq: Once | ORAL | Status: AC
  Administered 2024-03-06: 650 mg via ORAL
  Filled 2024-03-06: qty 2

## 2024-03-06 MED ORDER — DIPHENHYDRAMINE HCL 25 MG PO CAPS
25.0000 mg | ORAL_CAPSULE | Freq: Once | ORAL | Status: AC
  Administered 2024-03-06: 25 mg via ORAL
  Filled 2024-03-06: qty 1

## 2024-03-06 MED ORDER — IRON SUCROSE 200 MG IVPB - SIMPLE MED
200.0000 mg | Freq: Once | Status: AC
  Administered 2024-03-06: 200 mg via INTRAVENOUS
  Filled 2024-03-06: qty 110

## 2024-03-06 NOTE — Progress Notes (Signed)
 Diagnosis: Iron  Deficiency Anemia  Provider:  Mannam, Praveen MD  Procedure: IV Infusion  IV Type: Peripheral, IV Location: R Hand  Venofer (Iron  Sucrose), Dose: 200 mg  Infusion Start Time: 0952  Infusion Stop Time: 1010  Post Infusion IV Care: Observation period completed and Peripheral IV Discontinued  Discharge: Condition: Stable, Destination: Home . AVS Declined  Performed by:  Rocky FORBES Sar, RN

## 2024-03-08 ENCOUNTER — Ambulatory Visit (INDEPENDENT_AMBULATORY_CARE_PROVIDER_SITE_OTHER): Admitting: *Deleted

## 2024-03-08 VITALS — BP 146/85 | HR 76 | Temp 98.1°F | Resp 16 | Ht 70.0 in | Wt 345.4 lb

## 2024-03-08 DIAGNOSIS — D509 Iron deficiency anemia, unspecified: Secondary | ICD-10-CM | POA: Diagnosis not present

## 2024-03-08 MED ORDER — DIPHENHYDRAMINE HCL 25 MG PO CAPS: 25.0000 mg | ORAL_CAPSULE | Freq: Once | ORAL | Status: DC

## 2024-03-08 MED ORDER — DIPHENHYDRAMINE HCL 25 MG PO CAPS
25.0000 mg | ORAL_CAPSULE | Freq: Once | ORAL | Status: AC
  Administered 2024-03-08: 25 mg via ORAL
  Filled 2024-03-08: qty 1

## 2024-03-08 MED ORDER — IRON SUCROSE 200 MG IVPB - SIMPLE MED: 200.0000 mg | Freq: Once | Status: DC

## 2024-03-08 MED ORDER — IRON SUCROSE 200 MG IVPB - SIMPLE MED
200.0000 mg | Freq: Once | Status: AC
  Administered 2024-03-08: 200 mg via INTRAVENOUS
  Filled 2024-03-08: qty 110

## 2024-03-08 MED ORDER — ACETAMINOPHEN 325 MG PO TABS: 650.0000 mg | ORAL_TABLET | Freq: Once | ORAL | Status: DC

## 2024-03-08 MED ORDER — ACETAMINOPHEN 325 MG PO TABS
650.0000 mg | ORAL_TABLET | Freq: Once | ORAL | Status: AC
  Administered 2024-03-08: 650 mg via ORAL
  Filled 2024-03-08: qty 2

## 2024-03-08 NOTE — Progress Notes (Signed)
 Diagnosis:  Iron  Deficiency Anemia  Provider:  Mannam, Praveen MD  Procedure: IV Infusion  IV Type: Peripheral, IV Location: R Hand  , Venofer (Iron  Sucrose), Dose: 200 mg  Infusion Start Time: 0920 am  Infusion Stop Time: 0939 am  Post Infusion IV Care: Observation period completed and Peripheral IV Discontinued  Discharge: Condition: Good, Destination: Home . AVS Declined  Performed by:  Trudy Lamarr LABOR, RN

## 2024-03-11 ENCOUNTER — Ambulatory Visit

## 2024-03-11 VITALS — BP 143/85 | HR 75 | Temp 98.1°F | Resp 16 | Ht 70.0 in | Wt 348.0 lb

## 2024-03-11 DIAGNOSIS — D509 Iron deficiency anemia, unspecified: Secondary | ICD-10-CM

## 2024-03-11 MED ORDER — ACETAMINOPHEN 325 MG PO TABS
650.0000 mg | ORAL_TABLET | Freq: Once | ORAL | Status: AC
  Administered 2024-03-11: 650 mg via ORAL
  Filled 2024-03-11: qty 2

## 2024-03-11 MED ORDER — DIPHENHYDRAMINE HCL 25 MG PO CAPS
25.0000 mg | ORAL_CAPSULE | Freq: Once | ORAL | Status: AC
  Administered 2024-03-11: 25 mg via ORAL
  Filled 2024-03-11: qty 1

## 2024-03-11 MED ORDER — IRON SUCROSE 200 MG IVPB - SIMPLE MED
200.0000 mg | Freq: Once | Status: DC
  Filled 2024-03-11: qty 110

## 2024-03-11 NOTE — Progress Notes (Signed)
 Unable to get the IV access . Pt wanted to reschedule .

## 2024-03-13 ENCOUNTER — Ambulatory Visit

## 2024-03-19 ENCOUNTER — Telehealth: Payer: Self-pay | Admitting: Pharmacy Technician

## 2024-03-19 NOTE — Telephone Encounter (Signed)
 Auth Submission: NO AUTH NEEDED Site of care: Site of care: [facility] Payer: [employer] commercial Medication & CPT/J Code(s) submitted: Venofer  (Iron  Sucrose) J1756 Diagnosis Code: D50.9 Route of submission (phone, fax, portal): n/a Phone # Fax # Auth type: Buy/Bill PB Units/visits requested: 5 Reference number: n/a Approval from: 03/18/2024 to 07/04/2024

## 2024-03-20 ENCOUNTER — Ambulatory Visit

## 2024-03-20 MED ORDER — ACETAMINOPHEN 325 MG PO TABS: 650.0000 mg | ORAL_TABLET | Freq: Once | ORAL | Status: AC

## 2024-03-20 MED ORDER — DIPHENHYDRAMINE HCL 25 MG PO CAPS: 25.0000 mg | ORAL_CAPSULE | Freq: Once | ORAL | Status: AC

## 2024-03-20 MED ORDER — IRON SUCROSE 200 MG IVPB - SIMPLE MED: 200.0000 mg | Freq: Once | Status: AC

## 2024-05-10 ENCOUNTER — Inpatient Hospital Stay: Admitting: Oncology

## 2024-05-10 ENCOUNTER — Inpatient Hospital Stay: Attending: Oncology
